# Patient Record
Sex: Male | Born: 1960 | Race: White | Hispanic: No | Marital: Married | Smoking: Never smoker
Health system: Southern US, Community
[De-identification: ages and names within clinical notes are randomized; demographics above are authoritative.]

## PROBLEM LIST (undated history)

## (undated) DIAGNOSIS — I4891 Unspecified atrial fibrillation: Secondary | ICD-10-CM

## (undated) DIAGNOSIS — K219 Gastro-esophageal reflux disease without esophagitis: Secondary | ICD-10-CM

## (undated) DIAGNOSIS — N200 Calculus of kidney: Secondary | ICD-10-CM

## (undated) DIAGNOSIS — G4730 Sleep apnea, unspecified: Secondary | ICD-10-CM

## (undated) DIAGNOSIS — Z87442 Personal history of urinary calculi: Secondary | ICD-10-CM

## (undated) DIAGNOSIS — E785 Hyperlipidemia, unspecified: Secondary | ICD-10-CM

## (undated) DIAGNOSIS — I10 Essential (primary) hypertension: Secondary | ICD-10-CM

## (undated) DIAGNOSIS — J302 Other seasonal allergic rhinitis: Secondary | ICD-10-CM

## (undated) DIAGNOSIS — L409 Psoriasis, unspecified: Secondary | ICD-10-CM

## (undated) DIAGNOSIS — J189 Pneumonia, unspecified organism: Secondary | ICD-10-CM

## (undated) DIAGNOSIS — J45909 Unspecified asthma, uncomplicated: Secondary | ICD-10-CM

## (undated) DIAGNOSIS — E669 Obesity, unspecified: Secondary | ICD-10-CM

## (undated) DIAGNOSIS — D849 Immunodeficiency, unspecified: Secondary | ICD-10-CM

## (undated) DIAGNOSIS — L4050 Arthropathic psoriasis, unspecified: Secondary | ICD-10-CM

## (undated) HISTORY — PX: TRICEPS TENDON REPAIR: SHX2577

## (undated) HISTORY — DX: Unspecified atrial fibrillation: I48.91

## (undated) HISTORY — PX: NOSE SURGERY: SHX723

## (undated) HISTORY — DX: Essential (primary) hypertension: I10

## (undated) HISTORY — DX: Personal history of urinary calculi: Z87.442

## (undated) HISTORY — PX: COLONOSCOPY: SHX174

## (undated) HISTORY — DX: Arthropathic psoriasis, unspecified: L40.50

---

## 2014-03-13 ENCOUNTER — Encounter (HOSPITAL_COMMUNITY): Payer: Self-pay | Admitting: Emergency Medicine

## 2014-03-13 ENCOUNTER — Inpatient Hospital Stay (HOSPITAL_COMMUNITY)
Admission: EM | Admit: 2014-03-13 | Discharge: 2014-03-25 | DRG: 163 | Disposition: A | Discharge status: Home / Self Care | Payer: BC Managed Care – PPO | Attending: Cardiothoracic Surgery | Admitting: Cardiothoracic Surgery

## 2014-03-13 ENCOUNTER — Emergency Department (HOSPITAL_COMMUNITY): Payer: BC Managed Care – PPO

## 2014-03-13 DIAGNOSIS — G4733 Obstructive sleep apnea (adult) (pediatric): Secondary | ICD-10-CM | POA: Diagnosis present

## 2014-03-13 DIAGNOSIS — Z9989 Dependence on other enabling machines and devices: Secondary | ICD-10-CM

## 2014-03-13 DIAGNOSIS — J45909 Unspecified asthma, uncomplicated: Secondary | ICD-10-CM | POA: Diagnosis present

## 2014-03-13 DIAGNOSIS — N131 Hydronephrosis with ureteral stricture, not elsewhere classified: Secondary | ICD-10-CM

## 2014-03-13 DIAGNOSIS — N1330 Unspecified hydronephrosis: Secondary | ICD-10-CM | POA: Diagnosis present

## 2014-03-13 DIAGNOSIS — K219 Gastro-esophageal reflux disease without esophagitis: Secondary | ICD-10-CM | POA: Diagnosis present

## 2014-03-13 DIAGNOSIS — Z7951 Long term (current) use of inhaled steroids: Secondary | ICD-10-CM

## 2014-03-13 DIAGNOSIS — Z79899 Other long term (current) drug therapy: Secondary | ICD-10-CM

## 2014-03-13 DIAGNOSIS — J189 Pneumonia, unspecified organism: Principal | ICD-10-CM | POA: Diagnosis present

## 2014-03-13 DIAGNOSIS — J869 Pyothorax without fistula: Secondary | ICD-10-CM | POA: Diagnosis present

## 2014-03-13 DIAGNOSIS — R0902 Hypoxemia: Secondary | ICD-10-CM

## 2014-03-13 DIAGNOSIS — J9601 Acute respiratory failure with hypoxia: Secondary | ICD-10-CM

## 2014-03-13 DIAGNOSIS — I10 Essential (primary) hypertension: Secondary | ICD-10-CM | POA: Diagnosis present

## 2014-03-13 DIAGNOSIS — R0781 Pleurodynia: Secondary | ICD-10-CM

## 2014-03-13 DIAGNOSIS — M549 Dorsalgia, unspecified: Secondary | ICD-10-CM | POA: Diagnosis present

## 2014-03-13 DIAGNOSIS — E669 Obesity, unspecified: Secondary | ICD-10-CM

## 2014-03-13 DIAGNOSIS — E785 Hyperlipidemia, unspecified: Secondary | ICD-10-CM | POA: Diagnosis present

## 2014-03-13 DIAGNOSIS — J939 Pneumothorax, unspecified: Secondary | ICD-10-CM

## 2014-03-13 DIAGNOSIS — Z23 Encounter for immunization: Secondary | ICD-10-CM

## 2014-03-13 DIAGNOSIS — J90 Pleural effusion, not elsewhere classified: Secondary | ICD-10-CM

## 2014-03-13 DIAGNOSIS — Z4682 Encounter for fitting and adjustment of non-vascular catheter: Secondary | ICD-10-CM

## 2014-03-13 DIAGNOSIS — G4730 Sleep apnea, unspecified: Secondary | ICD-10-CM

## 2014-03-13 DIAGNOSIS — Z9889 Other specified postprocedural states: Secondary | ICD-10-CM

## 2014-03-13 DIAGNOSIS — Z6841 Body Mass Index (BMI) 40.0 and over, adult: Secondary | ICD-10-CM

## 2014-03-13 HISTORY — DX: Sleep apnea, unspecified: G47.30

## 2014-03-13 HISTORY — DX: Unspecified asthma, uncomplicated: J45.909

## 2014-03-13 HISTORY — DX: Psoriasis, unspecified: L40.9

## 2014-03-13 HISTORY — DX: Immunodeficiency, unspecified: D84.9

## 2014-03-13 HISTORY — DX: Essential (primary) hypertension: I10

## 2014-03-13 HISTORY — DX: Calculus of kidney: N20.0

## 2014-03-13 HISTORY — DX: Other seasonal allergic rhinitis: J30.2

## 2014-03-13 HISTORY — DX: Obesity, unspecified: E66.9

## 2014-03-13 HISTORY — DX: Hyperlipidemia, unspecified: E78.5

## 2014-03-13 MED ORDER — HYDROMORPHONE HCL 1 MG/ML IJ SOLN
1.0000 mg | Freq: Once | INTRAMUSCULAR | Status: AC
  Administered 2014-03-13: 1 mg via INTRAVENOUS
  Filled 2014-03-13: qty 1

## 2014-03-13 MED ORDER — HYDROMORPHONE HCL 1 MG/ML IJ SOLN
1.0000 mg | Freq: Once | INTRAMUSCULAR | Status: AC
  Administered 2014-03-14: 1 mg via INTRAVENOUS
  Filled 2014-03-13: qty 1

## 2014-03-13 MED ORDER — SODIUM CHLORIDE 0.9 % IV SOLN
INTRAVENOUS | Status: DC
  Administered 2014-03-13 – 2014-03-16 (×4): via INTRAVENOUS

## 2014-03-13 MED ORDER — ONDANSETRON HCL 4 MG/2ML IJ SOLN
4.0000 mg | Freq: Once | INTRAMUSCULAR | Status: AC
  Administered 2014-03-13: 4 mg via INTRAVENOUS
  Filled 2014-03-13: qty 2

## 2014-03-13 NOTE — ED Notes (Signed)
Patient states right mid back pain that radiated into chest. Patient states he also has shortness of breath. Patient states "it feels like a monster kidney stone"

## 2014-03-14 ENCOUNTER — Emergency Department (HOSPITAL_COMMUNITY): Payer: BC Managed Care – PPO

## 2014-03-14 ENCOUNTER — Encounter (HOSPITAL_COMMUNITY): Payer: Self-pay

## 2014-03-14 DIAGNOSIS — J9601 Acute respiratory failure with hypoxia: Secondary | ICD-10-CM | POA: Diagnosis present

## 2014-03-14 DIAGNOSIS — Z7951 Long term (current) use of inhaled steroids: Secondary | ICD-10-CM | POA: Diagnosis not present

## 2014-03-14 DIAGNOSIS — I10 Essential (primary) hypertension: Secondary | ICD-10-CM | POA: Diagnosis present

## 2014-03-14 DIAGNOSIS — Z79899 Other long term (current) drug therapy: Secondary | ICD-10-CM | POA: Diagnosis not present

## 2014-03-14 DIAGNOSIS — J189 Pneumonia, unspecified organism: Secondary | ICD-10-CM | POA: Diagnosis present

## 2014-03-14 DIAGNOSIS — J45909 Unspecified asthma, uncomplicated: Secondary | ICD-10-CM | POA: Diagnosis present

## 2014-03-14 DIAGNOSIS — R0902 Hypoxemia: Secondary | ICD-10-CM

## 2014-03-14 DIAGNOSIS — E785 Hyperlipidemia, unspecified: Secondary | ICD-10-CM | POA: Diagnosis present

## 2014-03-14 DIAGNOSIS — M549 Dorsalgia, unspecified: Secondary | ICD-10-CM | POA: Diagnosis present

## 2014-03-14 DIAGNOSIS — N1330 Unspecified hydronephrosis: Secondary | ICD-10-CM | POA: Diagnosis present

## 2014-03-14 DIAGNOSIS — J869 Pyothorax without fistula: Secondary | ICD-10-CM | POA: Diagnosis present

## 2014-03-14 DIAGNOSIS — R0781 Pleurodynia: Secondary | ICD-10-CM | POA: Diagnosis present

## 2014-03-14 DIAGNOSIS — G4730 Sleep apnea, unspecified: Secondary | ICD-10-CM | POA: Diagnosis present

## 2014-03-14 DIAGNOSIS — N131 Hydronephrosis with ureteral stricture, not elsewhere classified: Secondary | ICD-10-CM

## 2014-03-14 DIAGNOSIS — G4733 Obstructive sleep apnea (adult) (pediatric): Secondary | ICD-10-CM | POA: Diagnosis present

## 2014-03-14 DIAGNOSIS — K219 Gastro-esophageal reflux disease without esophagitis: Secondary | ICD-10-CM | POA: Diagnosis present

## 2014-03-14 DIAGNOSIS — Z6841 Body Mass Index (BMI) 40.0 and over, adult: Secondary | ICD-10-CM | POA: Diagnosis not present

## 2014-03-14 DIAGNOSIS — Z23 Encounter for immunization: Secondary | ICD-10-CM | POA: Diagnosis not present

## 2014-03-14 LAB — URINALYSIS, ROUTINE W REFLEX MICROSCOPIC
Bilirubin Urine: NEGATIVE
Glucose, UA: NEGATIVE mg/dL
Hgb urine dipstick: NEGATIVE
Ketones, ur: NEGATIVE mg/dL
LEUKOCYTES UA: NEGATIVE
Nitrite: NEGATIVE
PROTEIN: NEGATIVE mg/dL
Specific Gravity, Urine: 1.02 (ref 1.005–1.030)
Urobilinogen, UA: 0.2 mg/dL (ref 0.0–1.0)
pH: 5.5 (ref 5.0–8.0)

## 2014-03-14 LAB — CBC WITH DIFFERENTIAL/PLATELET
BASOS ABS: 0.1 10*3/uL (ref 0.0–0.1)
Basophils Relative: 0 % (ref 0–1)
EOS ABS: 0.7 10*3/uL (ref 0.0–0.7)
EOS PCT: 4 % (ref 0–5)
HEMATOCRIT: 39.4 % (ref 39.0–52.0)
Hemoglobin: 13.6 g/dL (ref 13.0–17.0)
LYMPHS ABS: 1.5 10*3/uL (ref 0.7–4.0)
Lymphocytes Relative: 10 % — ABNORMAL LOW (ref 12–46)
MCH: 29.6 pg (ref 26.0–34.0)
MCHC: 34.5 g/dL (ref 30.0–36.0)
MCV: 85.7 fL (ref 78.0–100.0)
MONO ABS: 1.1 10*3/uL — AB (ref 0.1–1.0)
Monocytes Relative: 7 % (ref 3–12)
Neutro Abs: 12.1 10*3/uL — ABNORMAL HIGH (ref 1.7–7.7)
Neutrophils Relative %: 79 % — ABNORMAL HIGH (ref 43–77)
Platelets: 291 10*3/uL (ref 150–400)
RBC: 4.6 MIL/uL (ref 4.22–5.81)
RDW: 14.6 % (ref 11.5–15.5)
WBC: 15.4 10*3/uL — ABNORMAL HIGH (ref 4.0–10.5)

## 2014-03-14 LAB — CBC
HCT: 39.9 % (ref 39.0–52.0)
Hemoglobin: 13.7 g/dL (ref 13.0–17.0)
MCH: 29.5 pg (ref 26.0–34.0)
MCHC: 34.3 g/dL (ref 30.0–36.0)
MCV: 85.8 fL (ref 78.0–100.0)
Platelets: 259 10*3/uL (ref 150–400)
RBC: 4.65 MIL/uL (ref 4.22–5.81)
RDW: 14.9 % (ref 11.5–15.5)
WBC: 17.5 10*3/uL — AB (ref 4.0–10.5)

## 2014-03-14 LAB — BASIC METABOLIC PANEL
Anion gap: 12 (ref 5–15)
BUN: 19 mg/dL (ref 6–23)
CALCIUM: 8.7 mg/dL (ref 8.4–10.5)
CO2: 24 mEq/L (ref 19–32)
Chloride: 105 mEq/L (ref 96–112)
Creatinine, Ser: 1.12 mg/dL (ref 0.50–1.35)
GFR calc Af Amer: 86 mL/min — ABNORMAL LOW (ref >90)
GFR, EST NON AFRICAN AMERICAN: 74 mL/min — AB (ref >90)
GLUCOSE: 103 mg/dL — AB (ref 70–99)
Potassium: 3.7 mEq/L (ref 3.7–5.3)
SODIUM: 141 meq/L (ref 137–147)

## 2014-03-14 MED ORDER — DEXTROSE 5 % IV SOLN
1.0000 g | Freq: Once | INTRAVENOUS | Status: DC
  Filled 2014-03-14: qty 10

## 2014-03-14 MED ORDER — ATORVASTATIN CALCIUM 40 MG PO TABS
40.0000 mg | ORAL_TABLET | Freq: Every day | ORAL | Status: DC
  Administered 2014-03-14 – 2014-03-24 (×10): 40 mg via ORAL
  Filled 2014-03-14 (×11): qty 1

## 2014-03-14 MED ORDER — SODIUM CHLORIDE 0.9 % IJ SOLN: 3.0000 mL | INTRAMUSCULAR | Status: DC | PRN

## 2014-03-14 MED ORDER — SODIUM CHLORIDE 0.9 % IV BOLUS (SEPSIS)
500.0000 mL | Freq: Once | INTRAVENOUS | Status: AC
  Administered 2014-03-14: 500 mL via INTRAVENOUS

## 2014-03-14 MED ORDER — MOMETASONE FURO-FORMOTEROL FUM 100-5 MCG/ACT IN AERO
2.0000 | INHALATION_SPRAY | Freq: Two times a day (BID) | RESPIRATORY_TRACT | Status: DC
  Administered 2014-03-14 – 2014-03-25 (×21): 2 via RESPIRATORY_TRACT
  Filled 2014-03-14 (×4): qty 8.8

## 2014-03-14 MED ORDER — DEXTROSE 5 % IV SOLN
1.0000 g | Freq: Every day | INTRAVENOUS | Status: DC
  Administered 2014-03-14 – 2014-03-16 (×4): 1 g via INTRAVENOUS
  Filled 2014-03-14 (×4): qty 10

## 2014-03-14 MED ORDER — SODIUM CHLORIDE 0.9 % IV SOLN: 250.0000 mL | INTRAVENOUS | Status: DC | PRN

## 2014-03-14 MED ORDER — KETOROLAC TROMETHAMINE 30 MG/ML IJ SOLN
30.0000 mg | Freq: Once | INTRAMUSCULAR | Status: AC
  Administered 2014-03-14: 30 mg via INTRAVENOUS
  Filled 2014-03-14: qty 1

## 2014-03-14 MED ORDER — ACETAMINOPHEN 325 MG PO TABS: 650.0000 mg | ORAL_TABLET | Freq: Four times a day (QID) | ORAL | Status: DC | PRN

## 2014-03-14 MED ORDER — SENNA 8.6 MG PO TABS
1.0000 | ORAL_TABLET | Freq: Two times a day (BID) | ORAL | Status: DC
  Administered 2014-03-14 – 2014-03-19 (×12): 8.6 mg via ORAL
  Filled 2014-03-14 (×14): qty 1

## 2014-03-14 MED ORDER — PANTOPRAZOLE SODIUM 40 MG PO TBEC
40.0000 mg | DELAYED_RELEASE_TABLET | Freq: Every day | ORAL | Status: DC
  Administered 2014-03-14 – 2014-03-25 (×11): 40 mg via ORAL
  Filled 2014-03-14 (×12): qty 1

## 2014-03-14 MED ORDER — ACETAMINOPHEN 650 MG RE SUPP
650.0000 mg | Freq: Four times a day (QID) | RECTAL | Status: DC | PRN
Start: 2014-03-14 — End: 2014-03-20

## 2014-03-14 MED ORDER — ONDANSETRON HCL 4 MG/2ML IJ SOLN: 4.0000 mg | Freq: Four times a day (QID) | INTRAMUSCULAR | Status: DC | PRN

## 2014-03-14 MED ORDER — HYDROCODONE-ACETAMINOPHEN 5-325 MG PO TABS
1.0000 | ORAL_TABLET | ORAL | Status: DC | PRN
  Administered 2014-03-14: 1 via ORAL
  Administered 2014-03-14: 2 via ORAL
  Administered 2014-03-14: 1 via ORAL
  Administered 2014-03-14 – 2014-03-16 (×10): 2 via ORAL
  Administered 2014-03-16: 1 via ORAL
  Administered 2014-03-16: 2 via ORAL
  Administered 2014-03-17: 1 via ORAL
  Administered 2014-03-17 (×2): 2 via ORAL
  Administered 2014-03-17: 1 via ORAL
  Administered 2014-03-18: 2 via ORAL
  Administered 2014-03-18 (×2): 1 via ORAL
  Administered 2014-03-18 – 2014-03-20 (×7): 2 via ORAL
  Filled 2014-03-14 (×6): qty 2
  Filled 2014-03-14 (×3): qty 1
  Filled 2014-03-14 (×12): qty 2
  Filled 2014-03-14: qty 1
  Filled 2014-03-14 (×2): qty 2
  Filled 2014-03-14: qty 1
  Filled 2014-03-14 (×5): qty 2
  Filled 2014-03-14: qty 1

## 2014-03-14 MED ORDER — SODIUM CHLORIDE 0.9 % IV SOLN: INTRAVENOUS | Status: DC

## 2014-03-14 MED ORDER — DEXTROSE 5 % IV SOLN
500.0000 mg | INTRAVENOUS | Status: DC
  Administered 2014-03-14 – 2014-03-16 (×4): 500 mg via INTRAVENOUS
  Filled 2014-03-14 (×4): qty 500

## 2014-03-14 MED ORDER — BISACODYL 10 MG RE SUPP
10.0000 mg | Freq: Every day | RECTAL | Status: DC | PRN
  Administered 2014-03-14: 10 mg via RECTAL
  Filled 2014-03-14: qty 1

## 2014-03-14 MED ORDER — MONTELUKAST SODIUM 10 MG PO TABS
10.0000 mg | ORAL_TABLET | Freq: Every day | ORAL | Status: DC
  Administered 2014-03-14 – 2014-03-24 (×11): 10 mg via ORAL
  Filled 2014-03-14 (×15): qty 1

## 2014-03-14 MED ORDER — LISINOPRIL 10 MG PO TABS
10.0000 mg | ORAL_TABLET | Freq: Every day | ORAL | Status: DC
  Administered 2014-03-14 – 2014-03-19 (×6): 10 mg via ORAL
  Filled 2014-03-14 (×7): qty 1

## 2014-03-14 MED ORDER — SODIUM CHLORIDE 0.9 % IJ SOLN
3.0000 mL | Freq: Two times a day (BID) | INTRAMUSCULAR | Status: DC
  Administered 2014-03-14 – 2014-03-19 (×7): 3 mL via INTRAVENOUS

## 2014-03-14 MED ORDER — TRAMADOL HCL 50 MG PO TABS
50.0000 mg | ORAL_TABLET | Freq: Four times a day (QID) | ORAL | Status: DC | PRN
  Administered 2014-03-14: 50 mg via ORAL
  Filled 2014-03-14 (×2): qty 1

## 2014-03-14 MED ORDER — ONDANSETRON HCL 4 MG PO TABS: 4.0000 mg | ORAL_TABLET | Freq: Four times a day (QID) | ORAL | Status: DC | PRN

## 2014-03-14 MED ORDER — HEPARIN SODIUM (PORCINE) 5000 UNIT/ML IJ SOLN
5000.0000 [IU] | Freq: Three times a day (TID) | INTRAMUSCULAR | Status: DC
  Administered 2014-03-14 – 2014-03-19 (×16): 5000 [IU] via SUBCUTANEOUS
  Filled 2014-03-14 (×22): qty 1

## 2014-03-14 NOTE — Progress Notes (Signed)
Pt was placed on CPAP for the night;pt has his home nasal mask attached to our machine.

## 2014-03-14 NOTE — Progress Notes (Signed)
Chart reviewed and patient examined.   Subjective: continues with pain on inspiration. "it really hurts to move and breath deeply"   Objective: Afebrile, hemodynamically stable. Mildly hypoxic on room air at rest.   PE:  Gen: sitting on side of bed.  CV: RRR No MGR No LE edema Resp: normal effort but shallow, BS diminished both bases. No wheeze Abdomen: obese soft +BS non-tender Neuro: alert and oriented. Speech clear   Assessment/Plan  CAP: continue rocephin and azithromax day #1. Obtain strep pnuemo urine antigen and legionella urine antigen. Provide supportive therapy i.e fluids, pain medicine, oxygen supplementation. Monitor closely. Blood cultures if fever.   Hypoxia: mild on room air at rest. Related to #1 in patient with hx asthma. See above. Continue home inhaler. Will ambulate and monitor saturation level  Back pain: lumbar mostly on right with some left flank pain. likely related to #1 in setting of left hydronephrosis. Pain med. Will likely need OP urology consult.   Left hydronephrosis without urolithiasis: per CT. Urine output good. Manage pain. May benefit OP urology follow up.   HTN : controlled. Continue home meds  Sleep apnea: CPAP    Toya SmothersKaren Timohty Renbarger, NP

## 2014-03-14 NOTE — H&P (Signed)
[facility] History and Physical  [name] MajorBruce Olivero ZOX:[id number] DOB: 04/23/1961 DOA: 03/13/2014  Referring physician: Dr. Lynelle DoctorKnapp PCP: No primary provider on file.  Specialists: none  Chief Complaint: back pain  Assessment/Plan Active Problems:   CAP (community acquired pneumonia) Hypoxemia Asthma OSA L hydronephrosis GERD HLD   CAP: R lower lobe pneumonia. Ambulatory hypoxemia to 85 in ED. On RA at rest. toradol adn dilaudid given for pain in ED.  - Admit for Obs.  - CTX and Azithro - deep breathing and ambulation encouraged as tolerated - tylenol PRN - tramadol PRN  Athma: no sign of exacerbation - continue home Dulera and singulair  HTN: normotensive on presentation - continue home Lisinopril 10mg    HLD - continue atorvastatin  OSA: wears CPAP at home - CPAP.   Hydronephrosis: L side. No acute process/stone. 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 Renal function is nml.  - f/u outpt.   GERD:  - protonix  DVT Prophylaxis: Hep Metompkin TID  Code Status: FULL Family Communication: no family available Disposition Plan: Obs. DC pending respiratory status  HPI: [name] MajorBruce [name] is a 53 y.o. male [facility] with  R flank pain to mid back pain. Getting worse. No w/ R lower chest pain. Worse w/ deep inspiration. Advil 600mg  w/ some benefit. Denies fevers, rash, dysuria, gross hematuria, palpitations, CP. Mild SOB. CPAP at night but no daytime O2. URI w/ cough for past 2 wks.   Review of Systems: Per HPI w/ all other systems negative.   Past Medical History  Diagnosis Date  . Hypertension   . Kidney stone   . Sleep apnea   . Asthma   . Seasonal allergies   . Hyperlipemia   . Psoriasis   . Immune deficiency disorder    Past Surgical History  Procedure Laterality Date  . Nose surgery     Social History:  History   Social History Narrative  . No narrative on file    No Known Allergies  History reviewed. No pertinent family history.  Prior to  Admission medications   Not on File   Physical Exam: Filed Vitals:   03/13/14 2149 03/14/14 0004 03/14/14 0026 03/14/14 0156  BP: 148/66 117/61  106/48  Pulse: 85 77  79  Temp: 98.6 F (37 C)   98.4 F (36.9 C)  TempSrc: Oral   Oral  Resp: 22 16  20   Height: 5\' 9"  (1.753 m)   5\' 9"  (1.753 m)  Weight: 129.275 kg (285 lb)   130.6 kg (287 lb 14.7 oz)  SpO2: 95% 93% 85% 94%     General:   NAD  Eyes: EOMI   ENT: mmm,  Neck: FROM  Cardiovascular:  RRR, no m/r/g  Respiratory: R lung base w/ diminished breath sounds and few crackles, no wheezing, nml effort  Abdomen: NABS, nonttp  Skin: warm, well perfused, intact  Musculoskeletal: No LE edema, moves all extremities spontaneously   Psychiatric: nml affect  Neurologic: CN2-12 Grossly intact, cerebellar fxn nml  Labs on Admission:  Basic Metabolic Panel:  Recent Labs Lab 03/13/14 2230  NA 141  K 3.7  CL 105  CO2 24  GLUCOSE 103*  BUN 19  CREATININE 1.12  CALCIUM 8.7   Liver Function Tests: No results found for this basename: AST, ALT, ALKPHOS, BILITOT, PROT, ALBUMIN,  in the last 168 hours No results found for this basename: LIPASE, AMYLASE,  in the last 168 hours No results found for this basename: AMMONIA,  in  the last 168 hours CBC:  Recent Labs Lab 03/13/14 2230  WBC 15.4*  NEUTROABS 12.1*  HGB 13.6  HCT 39.4  MCV 85.7  PLT 291   Cardiac Enzymes: No results found for this basename: CKTOTAL, CKMB, CKMBINDEX, TROPONINI,  in the last 168 hours  BNP (last 3 results) No results found for this basename: PROBNP,  in the last 8760 hours CBG: No results found for this basename: GLUCAP,  in the last 168 hours  Radiological Exams on Admission: Dg Chest 2 View  03/14/2014   CLINICAL DATA:  Productive cough with green sputum for 2 weeks. Chills. Sharp pain at the right lower chest with deep inspiration. Shortness of breath.  EXAM: CHEST  2 VIEW  COMPARISON:  CT abdomen and pelvis 03/13/2014  FINDINGS:  Linear infiltration in the lung bases corresponding with consolidation seen in the right lung base and atelectasis in the left lung base on previous CT. Shallow inspiration. Heart size and pulmonary vascularity are normal. Probable emphysematous changes in the upper lungs. Blunting of the right costophrenic angle suggesting small effusions.  IMPRESSION: Linear consolidation in the right lung base with small effusion consistent with pneumonia. Shallow inspiration. Atelectasis in the left base.   Electronically Signed   By: Burman Nieves M.D.   On: 03/14/2014 00:40   Ct Renal Stone Study  03/13/2014   CLINICAL DATA:  RIGHT flank pain beginning today, worsening throughout the days, intermittently unbearable. History of renal stones.  EXAM: CT ABDOMEN AND PELVIS WITHOUT CONTRAST  TECHNIQUE: Multidetector CT imaging of the abdomen and pelvis was performed following the standard protocol without IV contrast.  COMPARISON:  None.  FINDINGS: LUNG BASES: Included view of the lung bases demonstrates RIGHT lung base dense consolidation. LEFT lung base atelectasis. Heart size is normal, included pericardium is unremarkable. The visualized heart and pericardium are unremarkable.  KIDNEYS/BLADDER: Kidneys are orthotopic, demonstrating normal size and morphology. Moderate LEFT hydronephrosis. No urolithiasis. Limited assessment for renal masses on this nonenhanced examination. The unopacified ureters are normal in course and caliber. Urinary bladder is partially distended and unremarkable.  SOLID ORGANS: The liver, spleen, gallbladder, pancreas and adrenal glands are unremarkable for this non-contrast examination.  GASTROINTESTINAL TRACT: The stomach, small and large bowel are normal in course and caliber without inflammatory changes, the sensitivity may be decreased by lack of enteric contrast. Normal appendix.  PERITONEUM/RETROPERITONEUM: Mild Misty mesentery and small, likely reactive lymph nodes. No intraperitoneal free  fluid nor free air. Aortoiliac vessels are normal in course and caliber. No lymphadenopathy by CT size criteria. Internal reproductive organs are unremarkable.  SOFT TISSUES/ OSSEOUS STRUCTURES: Nonsuspicious. Moderate LEFT and small RIGHT fat containing inguinal hernia. Mild broad levoscoliosis.  IMPRESSION: Partially imaged RIGHT lower lobe pneumonia.  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 Recommend followup.  Mild misty mesentery is likely reactive.   Electronically Signed   By: Awilda Metro   On: 03/13/2014 23:57       Time spent: 50 min in direct pt care and coordination.    Danyale Ridinger, Elmon Else, MD [facility] [email or web address] Password [email or web address] 03/14/2014, 2:11 AM

## 2014-03-14 NOTE — ED Provider Notes (Signed)
CSN: [id number]     Arrival date & time 03/13/14  2143 History   First MD Initiated Contact with Patient 03/13/14 2204     Chief Complaint  Patient presents with  . Back Pain     (Consider location/radiation/quality/duration/timing/severity/associated sxs/prior Treatment) HPI Patient states this morning he woke up about 7 AM with pain in his right flank that radiated into his right upper abdomen that lasted about 1-1/2 hours intensely,  he states the pain didn't let up but was persistent all day. He states it started getting worse again this afternoon. He states movement makes it hurt more as does deep breathing. He has had nausea without vomiting or hematuria. He states he's had cough for the past 2 weeks of yellow-green sputum. He denies any fever. He states at times he has some shortness of breath. He states he's had this same pain before when he had a kidney stone. He does report a lot of tea ingestion and he works in a very [employer] where he sweats a lot. Patient states he has a history of allergy-induced asthma however he has not been wheezing.   PCP Dr Ignacia Palma in [place] No urology  Past Medical History  Diagnosis Date  . Hypertension   . Kidney stone   . Sleep apnea   . Asthma   . Seasonal allergies   . Hyperlipemia   . Psoriasis   . Immune deficiency disorder    Past Surgical History  Procedure Laterality Date  . Nose surgery     History reviewed. No pertinent family history. History  Substance Use Topics  . Smoking status: Never Smoker   . Smokeless tobacco: Not on file  . Alcohol Use: No  employed Lives at home Lives with spouse  Review of Systems  All other systems reviewed and are negative.     Allergies  Review of patient's allergies indicates no known allergies.  Home Medications   Prior to Admission medications   Not on File   BP 117/61  Pulse 77  Temp(Src) 98.6 F (37 C) (Oral)  Resp 16  Ht 5\' 9"  (1.753 m)  Wt 285 lb (129.275 kg)   BMI 42.07 kg/m2  SpO2 93%  Vital signs normal   Physical Exam  Nursing note and vitals reviewed. Constitutional: He is oriented to person, place, and time. He appears well-developed and well-nourished.  Non-toxic appearance. He does not appear ill. He appears distressed.  obese  HENT:  Head: Normocephalic and atraumatic.  Right Ear: External ear normal.  Left Ear: External ear normal.  Nose: Nose normal. No mucosal edema or rhinorrhea.  Mouth/Throat: Oropharynx is clear and moist and mucous membranes are normal. No dental abscesses or uvula swelling.  Eyes: Conjunctivae and EOM are normal. Pupils are equal, round, and reactive to light.  Neck: Normal range of motion and full passive range of motion without pain. Neck supple.  Cardiovascular: Normal rate, regular rhythm and normal heart sounds.  Exam reveals no gallop and no friction rub.   No murmur heard. Pulmonary/Chest: Effort normal and breath sounds normal. No respiratory distress. He has no wheezes. He has no rhonchi. He has no rales. He exhibits no tenderness and no crepitus.  Abdominal: Soft. Normal appearance and bowel sounds are normal. He exhibits no distension. There is tenderness. There is no rebound and no guarding.    Musculoskeletal: Normal range of motion. He exhibits no edema and no tenderness.       Back:  Moves all extremities  well.   Neurological: He is alert and oriented to person, place, and time. He has normal strength. No cranial nerve deficit.  Skin: Skin is warm, dry and intact. No rash noted. No erythema. There is pallor.  Psychiatric: His speech is normal and behavior is normal. His mood appears anxious.    ED Course  Procedures (including critical care time)  Medications  0.9 %  sodium chloride infusion ( Intravenous New Bag/Given 03/13/14 2245)  ketorolac (TORADOL) 30 MG/ML injection 30 mg (not administered)  cefTRIAXone (ROCEPHIN) 1 g in dextrose 5 % 50 mL IVPB (not administered)  azithromycin  (ZITHROMAX) 500 mg in dextrose 5 % 250 mL IVPB (not administered)  sodium chloride 0.9 % bolus 500 mL (not administered)  HYDROmorphone (DILAUDID) injection 1 mg (1 mg Intravenous Given 03/13/14 2245)  ondansetron (ZOFRAN) injection 4 mg (4 mg Intravenous Given 03/13/14 2245)  HYDROmorphone (DILAUDID) injection 1 mg (1 mg Intravenous Given 03/14/14 0000)    Patient was initially treated for a kidney stone. However after reviewing his CT scan he was started on community-acquired pneumonia antibiotics. He was also given anti-inflammatories for his pleuritic chest pain.  Nursing staff states they ambulated patient and his pulse ox dropped to 85% on room air.  01:00 Dr Konrad Dolores admit to med-surg, observe, team 2  Labs Review  Results for orders placed during the hospital encounter of 03/13/14  CBC WITH DIFFERENTIAL      Result Value Ref Range   WBC 15.4 (*) 4.0 - 10.5 K/uL   RBC 4.60  4.22 - 5.81 MIL/uL   Hemoglobin 13.6  13.0 - 17.0 g/dL   HCT 04.5  40.9 - 81.1 %   MCV 85.7  78.0 - 100.0 fL   MCH 29.6  26.0 - 34.0 pg   MCHC 34.5  30.0 - 36.0 g/dL   RDW 91.4  78.2 - 95.6 %   Platelets 291  150 - 400 K/uL   Neutrophils Relative % 79 (*) 43 - 77 %   Neutro Abs 12.1 (*) 1.7 - 7.7 K/uL   Lymphocytes Relative 10 (*) 12 - 46 %   Lymphs Abs 1.5  0.7 - 4.0 K/uL   Monocytes Relative 7  3 - 12 %   Monocytes Absolute 1.1 (*) 0.1 - 1.0 K/uL   Eosinophils Relative 4  0 - 5 %   Eosinophils Absolute 0.7  0.0 - 0.7 K/uL   Basophils Relative 0  0 - 1 %   Basophils Absolute 0.1  0.0 - 0.1 K/uL  BASIC METABOLIC PANEL      Result Value Ref Range   Sodium 141  137 - 147 mEq/L   Potassium 3.7  3.7 - 5.3 mEq/L   Chloride 105  96 - 112 mEq/L   CO2 24  19 - 32 mEq/L   Glucose, Bld 103 (*) 70 - 99 mg/dL   BUN 19  6 - 23 mg/dL   Creatinine, Ser 2.13  0.50 - 1.35 mg/dL   Calcium 8.7  8.4 - 08.6 mg/dL   GFR calc non Af Amer 74 (*) >90 mL/min   GFR calc Af Amer 86 (*) >90 mL/min   Anion gap 12  5 - 15    Laboratory interpretation all normal except leukocytosis     Imaging Review Dg Chest 2 View  03/14/2014   CLINICAL DATA:  Productive cough with green sputum for 2 weeks. Chills. Sharp pain at the right lower chest with deep inspiration. Shortness of breath.  EXAM:  CHEST  2 VIEW  COMPARISON:  CT abdomen and pelvis 03/13/2014  FINDINGS: Linear infiltration in the lung bases corresponding with consolidation seen in the right lung base and atelectasis in the left lung base on previous CT. Shallow inspiration. Heart size and pulmonary vascularity are normal. Probable emphysematous changes in the upper lungs. Blunting of the right costophrenic angle suggesting small effusions.  IMPRESSION: Linear consolidation in the right lung base with small effusion consistent with pneumonia. Shallow inspiration. Atelectasis in the left base.   Electronically Signed   By: Burman NievesWilliam  Stevens M.D.   On: 03/14/2014 00:40   Ct Renal Stone Study  03/13/2014   CLINICAL DATA:  RIGHT flank pain beginning today, worsening throughout the days, intermittently unbearable. History of renal stones.  EXAM: CT ABDOMEN AND PELVIS WITHOUT CONTRAST  TECHNIQUE: Multidetector CT imaging of the abdomen and pelvis was performed following the standard protocol without IV contrast.  COMPARISON:  None.  FINDINGS: LUNG BASES: Included view of the lung bases demonstrates RIGHT lung base dense consolidation. LEFT lung base atelectasis. Heart size is normal, included pericardium is unremarkable. The visualized heart and pericardium are unremarkable.  KIDNEYS/BLADDER: Kidneys are orthotopic, demonstrating normal size and morphology. Moderate LEFT hydronephrosis. No urolithiasis. Limited assessment for renal masses on this nonenhanced examination. The unopacified ureters are normal in course and caliber. Urinary bladder is partially distended and unremarkable.  SOLID ORGANS: The liver, spleen, gallbladder, pancreas and adrenal glands are unremarkable for  this non-contrast examination.  GASTROINTESTINAL TRACT: The stomach, small and large bowel are normal in course and caliber without inflammatory changes, the sensitivity may be decreased by lack of enteric contrast. Normal appendix.  PERITONEUM/RETROPERITONEUM: Mild Misty mesentery and small, likely reactive lymph nodes. No intraperitoneal free fluid nor free air. Aortoiliac vessels are normal in course and caliber. No lymphadenopathy by CT size criteria. Internal reproductive organs are unremarkable.  SOFT TISSUES/ OSSEOUS STRUCTURES: Nonsuspicious. Moderate LEFT and small RIGHT fat containing inguinal hernia. Mild broad levoscoliosis.  IMPRESSION: Partially imaged RIGHT lower lobe pneumonia.  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 Recommend followup.  Mild misty mesentery is likely reactive.   Electronically Signed   By: Awilda Metroourtnay  Bloomer   On: 03/13/2014 23:57     EKG Interpretation None       Date: 03/14/2014  Rate: 79  Rhythm: normal sinus rhythm  QRS Axis: normal  Intervals: normal  ST/T Wave abnormalities: normal  Conduction Disutrbances:none  Narrative Interpretation:   Old EKG Reviewed: none available    MDM   Final diagnoses:  CAP (community acquired pneumonia)  Hypoxia  Pleuritic chest pain     Plan admission  Devoria AlbeIva Duel Conrad, MD, Armando GangFACEP     Ward GivensIva L Rabab Currington, MD 03/14/14 [phone]

## 2014-03-14 NOTE — Care Management Note (Signed)
    Page 1 of 1   03/25/2014     3:14:12 PM CARE MANAGEMENT NOTE 03/25/2014  Patient:  [name],[name]   Account Number:  [id number]  Date Initiated:  03/14/2014  Documentation initiated by:  Anibal HendersonBOLDEN,[name]  Subjective/Objective Assessment:   Admitted with PNA. Pt is from home with spouse and family and will return home at D/C.     Action/Plan:   No needs at present. Will follow   Anticipated DC Date:  03/25/2014   Anticipated DC Plan:  HOME W HOME HEALTH SERVICES      DC Planning Services  CM consult      Choice offered to / List presented to:             Status of service:  Completed, signed off Medicare Important Message given?   (If response is "NO", the following Medicare IM given date fields will be blank) Date Medicare IM given:   Medicare IM given by:   Date Additional Medicare IM given:   Additional Medicare IM given by:    Discharge Disposition:  HOME/SELF CARE  Per UR Regulation:  Reviewed for med. necessity/level of care/duration of stay  If discussed at Long Length of Stay Meetings, dates discussed:   03/20/2014  03/25/2014    Comments:  03/25/14 Sidney AceJulie Leylany Nored, RN, BSN [phone] Pt for dc home today; no dc needs.  03/20/14 Sidney AceJulie Jazmyne Beauchesne, RN, BSN [phone] Pt to OR for VATS today.  03/18/14 1300 [name] Bolden RN/CM Pt was to D/C home today but O2 Sats still low, so further testing being done- may need  thoracentesis 03/14/14 1430 Anibal HendersonGeneva Bolden RN/CM

## 2014-03-14 NOTE — Progress Notes (Signed)
Patient ambulated in hallway. Prior o2 sats 93% and post o2 sats 93%. Patient tolerated well.

## 2014-03-14 NOTE — Progress Notes (Signed)
Pt ambulated in halls O2 sat 93% on room air after ambulation

## 2014-03-14 NOTE — Progress Notes (Signed)
Pt seen and examined. Agree with assessment and plan per Ms. Black, NP. Briefly, pt presents with chief complaint of back pain, ultimately found to have CAP and started on empiric abx. Cultures pending. Pt with continued back pains with L hydronephrosis. Consideration for possible Urology consult as OP. Pt does complain of decreased BM with abd fullness while on pain meds. Will give trial of cathartics.

## 2014-03-15 ENCOUNTER — Inpatient Hospital Stay (HOSPITAL_COMMUNITY): Payer: BC Managed Care – PPO

## 2014-03-15 DIAGNOSIS — G4730 Sleep apnea, unspecified: Secondary | ICD-10-CM

## 2014-03-15 LAB — COMPREHENSIVE METABOLIC PANEL
ALT: 15 U/L (ref 0–53)
ANION GAP: 12 (ref 5–15)
AST: 10 U/L (ref 0–37)
Albumin: 2.7 g/dL — ABNORMAL LOW (ref 3.5–5.2)
Alkaline Phosphatase: 78 U/L (ref 39–117)
BUN: 12 mg/dL (ref 6–23)
CO2: 23 mEq/L (ref 19–32)
CREATININE: 0.9 mg/dL (ref 0.50–1.35)
Calcium: 8.3 mg/dL — ABNORMAL LOW (ref 8.4–10.5)
Chloride: 103 mEq/L (ref 96–112)
GFR calc non Af Amer: >90 mL/min (ref >90)
Glucose, Bld: 123 mg/dL — ABNORMAL HIGH (ref 70–99)
Potassium: 4.1 mEq/L (ref 3.7–5.3)
Sodium: 138 mEq/L (ref 137–147)
TOTAL PROTEIN: 6.4 g/dL (ref 6.0–8.3)
Total Bilirubin: 0.7 mg/dL (ref 0.3–1.2)

## 2014-03-15 LAB — STREP PNEUMONIAE URINARY ANTIGEN: STREP PNEUMO URINARY ANTIGEN: NEGATIVE

## 2014-03-15 LAB — TSH: TSH: 3.54 u[IU]/mL (ref 0.350–4.500)

## 2014-03-15 LAB — CBC
HCT: 39.3 % (ref 39.0–52.0)
HEMOGLOBIN: 13.4 g/dL (ref 13.0–17.0)
MCH: 29.3 pg (ref 26.0–34.0)
MCHC: 34.1 g/dL (ref 30.0–36.0)
MCV: 85.8 fL (ref 78.0–100.0)
Platelets: 287 10*3/uL (ref 150–400)
RBC: 4.58 MIL/uL (ref 4.22–5.81)
RDW: 15.3 % (ref 11.5–15.5)
WBC: 20.2 10*3/uL — ABNORMAL HIGH (ref 4.0–10.5)

## 2014-03-15 LAB — HIV ANTIBODY (ROUTINE TESTING W REFLEX): HIV 1&2 Ab, 4th Generation: NONREACTIVE

## 2014-03-15 LAB — PRO B NATRIURETIC PEPTIDE: PRO B NATRI PEPTIDE: 60.3 pg/mL (ref 0–125)

## 2014-03-15 LAB — TROPONIN I: Troponin I: <0.3 ng/mL (ref <0.30)

## 2014-03-15 MED ORDER — VANCOMYCIN HCL 10 G IV SOLR
2000.0000 mg | Freq: Once | INTRAVENOUS | Status: DC
  Filled 2014-03-15: qty 2000

## 2014-03-15 MED ORDER — VANCOMYCIN HCL 10 G IV SOLR
1250.0000 mg | Freq: Two times a day (BID) | INTRAVENOUS | Status: DC
  Administered 2014-03-16 – 2014-03-17 (×3): 1250 mg via INTRAVENOUS
  Filled 2014-03-15 (×3): qty 1250

## 2014-03-15 NOTE — Progress Notes (Signed)
ANTIBIOTIC CONSULT NOTE  Pharmacy Consult for Vancomycin Indication: pneumonia  No Known Allergies  Patient Measurements: Height: 5\' 9"  (175.3 cm) Weight: 287 lb 14.7 oz (130.6 kg) IBW/kg (Calculated) : 70.7  Vital Signs: Temp: 98.4 F (36.9 C) (10/03 0544) Temp Source: Oral (10/03 0544) BP: 126/60 mmHg (10/03 1121) Pulse Rate: 64 (10/03 1121) Intake/Output from previous day: 10/02 0701 - 10/03 0700 In: 720 [P.O.:720] Out: 300 [Urine:300] Intake/Output from this shift:   Labs:  Recent Labs  03/13/14 2230 03/14/14 1144 03/15/14 0642  WBC 15.4* 17.5* 20.2*  HGB 13.6 13.7 13.4  PLT 291 259 287  CREATININE 1.12  --  0.90   Estimated Creatinine Clearance: 128.6 ml/min (by C-G formula based on Cr of 0.9). No results found for this basename: VANCOTROUGH, VANCOPEAK, VANCORANDOM, GENTTROUGH, GENTPEAK, GENTRANDOM, TOBRATROUGH, TOBRAPEAK, TOBRARND, AMIKACINPEAK, AMIKACINTROU, AMIKACIN,  in the last 72 hours   Microbiology: No results found for this or any previous visit (from the past 720 hour(s)).  Anti-infectives   Start     Dose/Rate Route Frequency Ordered Stop   03/14/14 0300  cefTRIAXone (ROCEPHIN) 1 g in dextrose 5 % 50 mL IVPB     1 g 100 mL/hr over 30 Minutes Intravenous Daily at bedtime 03/14/14 0247     03/14/14 0015  cefTRIAXone (ROCEPHIN) 1 g in dextrose 5 % 50 mL IVPB  Status:  Discontinued     1 g 100 mL/hr over 30 Minutes Intravenous  Once 03/14/14 0005 03/14/14 0318   03/14/14 0015  azithromycin (ZITHROMAX) 500 mg in dextrose 5 % 250 mL IVPB     500 mg 250 mL/hr over 60 Minutes Intravenous Every 24 hours 03/14/14 0005       Assessment: Okay for Protocol for protocol, elevated WBC, Vancomycin to be added to Zithromax / Ceftriaxone regimen initially prescribed for CAP.  Hydronephrosis noted.  Obesity/Normalized CrCl dosing protocol will be initiate with an estimated normalized CrCl = 97 ml/min.   Goal of Therapy:  Vancomycin trough level 15-20  mcg/ml  Plan:  Vancomycin 2000mg  IV x 1, then 1500mg  IV every 12 hours. Measure antibiotic drug levels at steady state Follow up culture results  Mady GemmaHayes, Ariannie Penaloza R 03/15/2014,2:05 PM

## 2014-03-15 NOTE — Progress Notes (Signed)
Patient is using his own CPAP unit form home with his own mask. The unit has 3lpm bled into it while at rest.

## 2014-03-15 NOTE — Progress Notes (Signed)
[facility] PROGRESS NOTE  [name] [name] ZOX:[id number] DOB: January 11, 1961 DOA: 03/13/2014 PCP: No primary provider on file.  Assessment/Plan: 1. CAP 1. Pt is continued on azithromycin and rocephin 2. Remains afebrile overnight, however leukocytosis worsened to 20k<-15k 3. Will obtain blood cx. UA unremarkable 4. Repeat cxr for interval change with findings suggestive of ?CHF. Will order 2d echo, bnp, TSH, serial cardiac enzymes 5. Will add vanc to broaden abx spectrum 2. Hypoxia 1. Improved   3. Back pain 1. Cont pain meds as tolerated 4. L sided hydronephrosis 1. Pt voiding well 2. Normal renal function 3. Follow up as OP 5. HTN 1. BP stable 2. Cont current regimen 6. OSA 1. Cont cpap as tolerated 7. DVT prophylaxis 1. Heparin subQ  Code Status: Full Family Communication: Pt in room Disposition Plan: Pending   Consultants:    Procedures:    Antibiotics:  Azithromycin 10/2>>>  Rocepin 10/2>>>  Vancomycin 10/3>>>  HPI/Subjective: Reports feeling better. No acute events noted overnight  Objective: Filed Vitals:   03/14/14 1919 03/14/14 2009 03/15/14 0544 03/15/14 0738  BP:  143/67 130/59   Pulse:  80 73   Temp:  99.4 F (37.4 C) 98.4 F (36.9 C)   TempSrc:  Oral Oral   Resp:  20 14   Height:      Weight:      SpO2: 92% 93% 92% 87%    Intake/Output Summary (Last 24 hours) at 03/15/14 0913 Last data filed at 03/14/14 1727  Gross per 24 hour  Intake    720 ml  Output    300 ml  Net    420 ml   Filed Weights   03/13/14 2149 03/14/14 0156  Weight: 129.275 kg (285 lb) 130.6 kg (287 lb 14.7 oz)    Exam:   General:  Awake, in nad  Cardiovascular: regular,s1, s2  Respiratory: normal resp effort, no wheezing  Abdomen: soft, obese, nondistended  Musculoskeletal: perfused, no clubbing   Data Reviewed: Basic Metabolic Panel:  Recent Labs Lab 03/13/14 2230 03/15/14 0642  NA 141 138  K 3.7 4.1  CL 105 103  CO2 24 23  GLUCOSE 103*  123*  BUN 19 12  CREATININE 1.12 0.90  CALCIUM 8.7 8.3*   Liver Function Tests:  Recent Labs Lab 03/15/14 0642  AST 10  ALT 15  ALKPHOS 78  BILITOT 0.7  PROT 6.4  ALBUMIN 2.7*   No results found for this basename: LIPASE, AMYLASE,  in the last 168 hours No results found for this basename: AMMONIA,  in the last 168 hours CBC:  Recent Labs Lab 03/13/14 2230 03/14/14 1144 03/15/14 0642  WBC 15.4* 17.5* 20.2*  NEUTROABS 12.1*  --   --   HGB 13.6 13.7 13.4  HCT 39.4 39.9 39.3  MCV 85.7 85.8 85.8  PLT 291 259 287   Cardiac Enzymes: No results found for this basename: CKTOTAL, CKMB, CKMBINDEX, TROPONINI,  in the last 168 hours BNP (last 3 results) No results found for this basename: PROBNP,  in the last 8760 hours CBG: No results found for this basename: GLUCAP,  in the last 168 hours  No results found for this or any previous visit (from the past 240 hour(s)).   Studies: Dg Chest 2 View  03/14/2014   CLINICAL DATA:  Productive cough with green sputum for 2 weeks. Chills. Sharp pain at the right lower chest with deep inspiration. Shortness of breath.  EXAM: CHEST  2 VIEW  COMPARISON:  CT abdomen and  pelvis 03/13/2014  FINDINGS: Linear infiltration in the lung bases corresponding with consolidation seen in the right lung base and atelectasis in the left lung base on previous CT. Shallow inspiration. Heart size and pulmonary vascularity are normal. Probable emphysematous changes in the upper lungs. Blunting of the right costophrenic angle suggesting small effusions.  IMPRESSION: Linear consolidation in the right lung base with small effusion consistent with pneumonia. Shallow inspiration. Atelectasis in the left base.   Electronically Signed   By: Burman NievesWilliam  Stevens M.D.   On: 03/14/2014 00:40   Ct Renal Stone Study  03/13/2014   CLINICAL DATA:  RIGHT flank pain beginning today, worsening throughout the days, intermittently unbearable. History of renal stones.  EXAM: CT ABDOMEN  AND PELVIS WITHOUT CONTRAST  TECHNIQUE: Multidetector CT imaging of the abdomen and pelvis was performed following the standard protocol without IV contrast.  COMPARISON:  None.  FINDINGS: LUNG BASES: Included view of the lung bases demonstrates RIGHT lung base dense consolidation. LEFT lung base atelectasis. Heart size is normal, included pericardium is unremarkable. The visualized heart and pericardium are unremarkable.  KIDNEYS/BLADDER: Kidneys are orthotopic, demonstrating normal size and morphology. Moderate LEFT hydronephrosis. No urolithiasis. Limited assessment for renal masses on this nonenhanced examination. The unopacified ureters are normal in course and caliber. Urinary bladder is partially distended and unremarkable.  SOLID ORGANS: The liver, spleen, gallbladder, pancreas and adrenal glands are unremarkable for this non-contrast examination.  GASTROINTESTINAL TRACT: The stomach, small and large bowel are normal in course and caliber without inflammatory changes, the sensitivity may be decreased by lack of enteric contrast. Normal appendix.  PERITONEUM/RETROPERITONEUM: Mild Misty mesentery and small, likely reactive lymph nodes. No intraperitoneal free fluid nor free air. Aortoiliac vessels are normal in course and caliber. No lymphadenopathy by CT size criteria. Internal reproductive organs are unremarkable.  SOFT TISSUES/ OSSEOUS STRUCTURES: Nonsuspicious. Moderate LEFT and small RIGHT fat containing inguinal hernia. Mild broad levoscoliosis.  IMPRESSION: Partially imaged RIGHT lower lobe pneumonia.  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 Recommend followup.  Mild misty mesentery is likely reactive.   Electronically Signed   By: Awilda Metroourtnay  Bloomer   On: 03/13/2014 23:57    Scheduled Meds: . atorvastatin  40 mg Oral QPC supper  . azithromycin  500 mg Intravenous Q24H  . cefTRIAXone (ROCEPHIN)  IV  1 g Intravenous QHS  . heparin  5,000 Units  Subcutaneous 3 times per day  . lisinopril  10 mg Oral Daily  . mometasone-formoterol  2 puff Inhalation BID  . montelukast  10 mg Oral QHS  . pantoprazole  40 mg Oral Daily  . senna  1 tablet Oral BID  . sodium chloride  3 mL Intravenous Q12H   Continuous Infusions: . sodium chloride 50 mL/hr at 03/15/14 [id number]    Active Problems:   CAP (community acquired pneumonia)   Hydronephrosis, left   Back pain   Hypertension   Sleep apnea   Hyperlipemia  Time spent: 35min  Dai Mcadams K  [facility] Pager [phone]. If 7PM-7AM, please contact night-coverage at [email or web address], password [email or web address] 03/15/2014, 9:13 AM  LOS: 2 days

## 2014-03-16 ENCOUNTER — Other Ambulatory Visit (HOSPITAL_COMMUNITY): Payer: Self-pay | Admitting: Cardiology

## 2014-03-16 LAB — BASIC METABOLIC PANEL
Anion gap: 12 (ref 5–15)
BUN: 11 mg/dL (ref 6–23)
CALCIUM: 9 mg/dL (ref 8.4–10.5)
CO2: 26 mEq/L (ref 19–32)
Chloride: 101 mEq/L (ref 96–112)
Creatinine, Ser: 0.93 mg/dL (ref 0.50–1.35)
Glucose, Bld: 115 mg/dL — ABNORMAL HIGH (ref 70–99)
POTASSIUM: 4.4 meq/L (ref 3.7–5.3)
Sodium: 139 mEq/L (ref 137–147)

## 2014-03-16 LAB — LEGIONELLA ANTIGEN, URINE

## 2014-03-16 LAB — CBC WITH DIFFERENTIAL/PLATELET
Basophils Absolute: 0.1 10*3/uL (ref 0.0–0.1)
Basophils Relative: 0 % (ref 0–1)
EOS PCT: 5 % (ref 0–5)
Eosinophils Absolute: 0.9 10*3/uL — ABNORMAL HIGH (ref 0.0–0.7)
HEMATOCRIT: 41.4 % (ref 39.0–52.0)
HEMOGLOBIN: 14 g/dL (ref 13.0–17.0)
LYMPHS PCT: 9 % — AB (ref 12–46)
Lymphs Abs: 1.6 10*3/uL (ref 0.7–4.0)
MCH: 29.2 pg (ref 26.0–34.0)
MCHC: 33.8 g/dL (ref 30.0–36.0)
MCV: 86.4 fL (ref 78.0–100.0)
MONO ABS: 1.6 10*3/uL — AB (ref 0.1–1.0)
MONOS PCT: 9 % (ref 3–12)
Neutro Abs: 14.4 10*3/uL — ABNORMAL HIGH (ref 1.7–7.7)
Neutrophils Relative %: 77 % (ref 43–77)
Platelets: 342 10*3/uL (ref 150–400)
RBC: 4.79 MIL/uL (ref 4.22–5.81)
RDW: 15.2 % (ref 11.5–15.5)
WBC: 18.6 10*3/uL — ABNORMAL HIGH (ref 4.0–10.5)

## 2014-03-16 LAB — TROPONIN I: Troponin I: <0.3 ng/mL (ref <0.30)

## 2014-03-16 LAB — EXPECTORATED SPUTUM ASSESSMENT W GRAM STAIN, RFLX TO RESP C

## 2014-03-16 LAB — EXPECTORATED SPUTUM ASSESSMENT W REFEX TO RESP CULTURE

## 2014-03-16 MED ORDER — KETOROLAC TROMETHAMINE 15 MG/ML IJ SOLN
15.0000 mg | Freq: Four times a day (QID) | INTRAMUSCULAR | Status: DC | PRN
  Administered 2014-03-16 – 2014-03-18 (×5): 15 mg via INTRAVENOUS
  Filled 2014-03-16 (×5): qty 1

## 2014-03-16 NOTE — Progress Notes (Signed)
[facility] PROGRESS NOTE  Jaclynn MajorBruce Kassel ZOX:[id number] DOB: 03/19/1961 DOA: 03/13/2014 PCP: No primary provider on file.  Assessment/Plan: 1. CAP 1. Pt is continued on azithromycin and rocephin 2. Remains afebrile overnight,leukocytosis improving to 20k->185k 3. Blood cx pending. UA unremarkable 4. Repeat cxr for interval change with findings suggestive of ?CHF. BNP is normal, as is TSH and serial trop 5. Recently added vanc to broaden abx spectrum 2. Hypoxia 1. Improved   3. Back pain 1. Cont pain meds as tolerated 4. L sided hydronephrosis 1. Pt voiding well 2. Normal renal function 3. Follow up as OP 5. HTN 1. BP stable 2. Cont current regimen 6. OSA 1. Cont cpap as tolerated 7. DVT prophylaxis 1. Heparin subQ  Code Status: Full Family Communication: Pt in room Disposition Plan: Pending   Consultants:    Procedures:    Antibiotics:  Azithromycin 10/2>>>  Rocepin 10/2>>>  Vancomycin 10/3>>>  HPI/Subjective: Reports feeling better. Feels less sob.  Objective: Filed Vitals:   03/15/14 1951 03/15/14 2300 03/16/14 0509 03/16/14 0819  BP:  136/66 133/64   Pulse:  87 76   Temp:  98.2 F (36.8 C) 98.2 F (36.8 C)   TempSrc:  Oral Oral   Resp:  20 16   Height:      Weight:      SpO2: 94% 94% 94% 93%    Intake/Output Summary (Last 24 hours) at 03/16/14 1029 Last data filed at 03/16/14 0931  Gross per 24 hour  Intake    600 ml  Output      0 ml  Net    600 ml   Filed Weights   03/13/14 2149 03/14/14 0156  Weight: 129.275 kg (285 lb) 130.6 kg (287 lb 14.7 oz)    Exam:   General:  Awake, in nad  Cardiovascular: regular,s1, s2  Respiratory: normal resp effort, no wheezing  Abdomen: soft, obese, nondistended  Musculoskeletal: perfused, no clubbing   Data Reviewed: Basic Metabolic Panel:  Recent Labs Lab 03/13/14 2230 03/15/14 0642 03/16/14 0616  NA 141 138 139  K 3.7 4.1 4.4  CL 105 103 101  CO2 24 23 26   GLUCOSE 103* 123*  115*  BUN 19 12 11   CREATININE 1.12 0.90 0.93  CALCIUM 8.7 8.3* 9.0   Liver Function Tests:  Recent Labs Lab 03/15/14 0642  AST 10  ALT 15  ALKPHOS 78  BILITOT 0.7  PROT 6.4  ALBUMIN 2.7*   No results found for this basename: LIPASE, AMYLASE,  in the last 168 hours No results found for this basename: AMMONIA,  in the last 168 hours CBC:  Recent Labs Lab 03/13/14 2230 03/14/14 1144 03/15/14 0642 03/16/14 0616  WBC 15.4* 17.5* 20.2* 18.6*  NEUTROABS 12.1*  --   --  14.4*  HGB 13.6 13.7 13.4 14.0  HCT 39.4 39.9 39.3 41.4  MCV 85.7 85.8 85.8 86.4  PLT 291 259 287 342   Cardiac Enzymes:  Recent Labs Lab 03/15/14 1411 03/15/14 2000 03/16/14 0204  TROPONINI <0.30 <0.30 <0.30   BNP (last 3 results)  Recent Labs  03/15/14 1411  PROBNP 60.3   CBG: No results found for this basename: GLUCAP,  in the last 168 hours  Recent Results (from the past 240 hour(s))  CULTURE, BLOOD (ROUTINE X 2)     Status: None   Collection Time    03/15/14 10:17 AM      Result Value Ref Range Status   Specimen Description LEFT ANTECUBITAL   Final  Special Requests BOTTLES DRAWN AEROBIC AND ANAEROBIC 8CC   Final   Culture NO GROWTH 1 DAY   Final   Report Status PENDING   Incomplete  CULTURE, BLOOD (ROUTINE X 2)     Status: None   Collection Time    03/15/14 10:17 AM      Result Value Ref Range Status   Specimen Description BLOOD LEFT HAND   Final   Special Requests BOTTLES DRAWN AEROBIC AND ANAEROBIC 6CC   Final   Culture NO GROWTH 1 DAY   Final   Report Status PENDING   Incomplete     Studies: Dg Chest Port 1 View  03/15/2014   CLINICAL DATA:  Right lower chest pain with inspiratory pleuritic component; shortness of breath. ; 2 week history of productive cough and recent diagnosis of community acquired pneumonia; history of asthma and hypertension ; no history of tobacco use  EXAM: PORTABLE CHEST - 1 VIEW  COMPARISON:  PA and lateral chest x-ray of March 14, 2014  FINDINGS:  The left lung is adequately inflated. There is no focal infiltrate. The interstitial markings are coarse. On the right there is confluent alveolar density in the mid and lower lung consistent with pleural fluid. There may be underlying pneumonia. The interstitial markings within the aerated portion of the right upper lobe are increased. The cardiopericardial silhouette is enlarged. The observed bony thorax is unremarkable.  IMPRESSION: Congestive heart failure with mild pulmonary interstitial edema especially on the right. Right basilar atelectasis or pneumonia may be present but obscured by the increasing right pleural effusion. These findings have appeared since yesterday's study.   Electronically Signed   By: David  [place]   On: 03/15/2014 11:33    Scheduled Meds: . atorvastatin  40 mg Oral QPC supper  . azithromycin  500 mg Intravenous Q24H  . cefTRIAXone (ROCEPHIN)  IV  1 g Intravenous QHS  . heparin  5,000 Units Subcutaneous 3 times per day  . lisinopril  10 mg Oral Daily  . mometasone-formoterol  2 puff Inhalation BID  . montelukast  10 mg Oral QHS  . pantoprazole  40 mg Oral Daily  . senna  1 tablet Oral BID  . sodium chloride  3 mL Intravenous Q12H  . vancomycin  2,000 mg Intravenous Once   Followed by  . vancomycin  1,250 mg Intravenous Q12H   Continuous Infusions: . sodium chloride 50 mL/hr at 03/16/14 0600    Active Problems:   CAP (community acquired pneumonia)   Hydronephrosis, left   Back pain   Hypertension   Sleep apnea   Hyperlipemia  Time spent:  Antrice Pal K  [facility] Pager [phone]. If 7PM-7AM, please contact night-coverage at [email or web address], password [facility] 03/16/2014, 10:29 AM  LOS: 3 days

## 2014-03-17 ENCOUNTER — Inpatient Hospital Stay (HOSPITAL_COMMUNITY): Payer: BC Managed Care – PPO

## 2014-03-17 ENCOUNTER — Encounter (HOSPITAL_COMMUNITY): Payer: Self-pay | Admitting: Internal Medicine

## 2014-03-17 DIAGNOSIS — E669 Obesity, unspecified: Secondary | ICD-10-CM

## 2014-03-17 LAB — CBC WITH DIFFERENTIAL/PLATELET
Basophils Absolute: 0.1 10*3/uL (ref 0.0–0.1)
Basophils Relative: 0 % (ref 0–1)
EOS ABS: 1 10*3/uL — AB (ref 0.0–0.7)
Eosinophils Relative: 7 % — ABNORMAL HIGH (ref 0–5)
HCT: 38.4 % — ABNORMAL LOW (ref 39.0–52.0)
Hemoglobin: 13 g/dL (ref 13.0–17.0)
Lymphocytes Relative: 10 % — ABNORMAL LOW (ref 12–46)
Lymphs Abs: 1.6 10*3/uL (ref 0.7–4.0)
MCH: 29.1 pg (ref 26.0–34.0)
MCHC: 33.9 g/dL (ref 30.0–36.0)
MCV: 86.1 fL (ref 78.0–100.0)
Monocytes Absolute: 1.5 10*3/uL — ABNORMAL HIGH (ref 0.1–1.0)
Monocytes Relative: 10 % (ref 3–12)
Neutro Abs: 11 10*3/uL — ABNORMAL HIGH (ref 1.7–7.7)
Neutrophils Relative %: 73 % (ref 43–77)
PLATELETS: 363 10*3/uL (ref 150–400)
RBC: 4.46 MIL/uL (ref 4.22–5.81)
RDW: 15.1 % (ref 11.5–15.5)
WBC: 15.1 10*3/uL — AB (ref 4.0–10.5)

## 2014-03-17 LAB — BASIC METABOLIC PANEL
Anion gap: 11 (ref 5–15)
BUN: 13 mg/dL (ref 6–23)
CO2: 27 mEq/L (ref 19–32)
Calcium: 8.9 mg/dL (ref 8.4–10.5)
Chloride: 103 mEq/L (ref 96–112)
Creatinine, Ser: 1.01 mg/dL (ref 0.50–1.35)
GFR calc Af Amer: >90 mL/min (ref >90)
GFR, EST NON AFRICAN AMERICAN: 84 mL/min — AB (ref >90)
Glucose, Bld: 108 mg/dL — ABNORMAL HIGH (ref 70–99)
POTASSIUM: 4 meq/L (ref 3.7–5.3)
SODIUM: 141 meq/L (ref 137–147)

## 2014-03-17 MED ORDER — AZITHROMYCIN 250 MG PO TABS
500.0000 mg | ORAL_TABLET | Freq: Every day | ORAL | Status: DC
  Administered 2014-03-17 – 2014-03-18 (×2): 500 mg via ORAL
  Filled 2014-03-17 (×2): qty 2

## 2014-03-17 MED ORDER — CEFUROXIME AXETIL 250 MG PO TABS
250.0000 mg | ORAL_TABLET | Freq: Two times a day (BID) | ORAL | Status: DC
  Administered 2014-03-17 – 2014-03-18 (×2): 250 mg via ORAL
  Filled 2014-03-17 (×2): qty 1

## 2014-03-17 NOTE — Progress Notes (Signed)
Late entry:  03/16/14 @ 2000 At rest oxygen saturations are 93% room air. Patient ambulated [place] with oxygen saturations at 93% room air. No acute distress noted. Will continue to monitor.

## 2014-03-17 NOTE — Progress Notes (Signed)
Pt seen and examined. Agree with above assessment and plan per [name], NP. Briefly, pt presents with CAP, slowly improving with IV abx. Plan to transition to PO abx today and monitor overnight. If stable tomorrow, consider d/c tomorrow with close outpatient follow up.

## 2014-03-17 NOTE — Progress Notes (Signed)
[facility] PROGRESS NOTE  [name] MajorBruce Malcomb ZOX:[id number]:[id number] DOB: 10/29/1960 DOA: 03/13/2014 PCP: No primary provider on file.  Assessment/Plan: 1. CAP  1. Max temp 99.1 over last 24 hour. ,leukocytosis improving to 15.1k 2. Blood cx no growth to date, UA unremarkable 3. Await repeat  cxr for interval change results.  4. Will discontinue vanc and rocephin and change azithromycin to po. Add ceftin. Monitor for fever, worsening leukocytosis 2. Hypoxia  1. Resolved   3. Back pain  1. Cont pain meds as tolerated 2. controlled 4. L sided hydronephrosis  1. Pt continues to void 2. Normal renal function 3. Follow up as OP 5. HTN  1. BP stable 2. Cont current regimen 6. OSA  1. Cont cpap as tolerated 7. DVT prophylaxis  1. Heparin subQ  Obesity: BMI 42.6. Will request nutritional consult    Code Status: full Family Communication: wife at bedside Disposition Plan: home tomorrow   Consultants:    Procedures:    Antibiotics: Azithromycin 10/2>>>  Rocepin 10/2>>> 03/17/14 Vancomycin 10/3>>>03/17/14 ceftin 03/17/14>>>   HPI/Subjective: Sitting on side of bed smiling. Reports feeling "much better".   Objective: Filed Vitals:   03/17/14 0527  BP: 147/78  Pulse: 80  Temp: 98.1 F (36.7 C)  Resp: 20    Intake/Output Summary (Last 24 hours) at 03/17/14 1057 Last data filed at 03/16/14 1824  Gross per 24 hour  Intake   1550 ml  Output      0 ml  Net   1550 ml   Filed Weights   03/13/14 2149 03/14/14 0156  Weight: 129.275 kg (285 lb) 130.6 kg (287 lb 14.7 oz)    Exam:   General:  Obese appears comfortable  Cardiovascular: RRR No MGR No LE edema  Respiratory: normal effort BS diminished on right base otherwise good air flow with no wheeze/crackles  Abdomen: obese soft +BS non-tender to palpation non-distended  Musculoskeletal: no clubbing cyanosis  Data Reviewed: Basic Metabolic Panel:  Recent Labs Lab 03/13/14 2230 03/15/14 0642 03/16/14 0616  03/17/14 0541  NA 141 138 139 141  K 3.7 4.1 4.4 4.0  CL 105 103 101 103  CO2 24 23 26 27   GLUCOSE 103* 123* 115* 108*  BUN 19 12 11 13   CREATININE 1.12 0.90 0.93 1.01  CALCIUM 8.7 8.3* 9.0 8.9   Liver Function Tests:  Recent Labs Lab 03/15/14 0642  AST 10  ALT 15  ALKPHOS 78  BILITOT 0.7  PROT 6.4  ALBUMIN 2.7*   No results found for this basename: LIPASE, AMYLASE,  in the last 168 hours No results found for this basename: AMMONIA,  in the last 168 hours CBC:  Recent Labs Lab 03/13/14 2230 03/14/14 1144 03/15/14 0642 03/16/14 0616 03/17/14 0541  WBC 15.4* 17.5* 20.2* 18.6* 15.1*  NEUTROABS 12.1*  --   --  14.4* 11.0*  HGB 13.6 13.7 13.4 14.0 13.0  HCT 39.4 39.9 39.3 41.4 38.4*  MCV 85.7 85.8 85.8 86.4 86.1  PLT 291 259 287 342 363   Cardiac Enzymes:  Recent Labs Lab 03/15/14 1411 03/15/14 2000 03/16/14 0204  TROPONINI <0.30 <0.30 <0.30   BNP (last 3 results)  Recent Labs  03/15/14 1411  PROBNP 60.3   CBG: No results found for this basename: GLUCAP,  in the last 168 hours  Recent Results (from the past 240 hour(s))  CULTURE, BLOOD (ROUTINE X 2)     Status: None   Collection Time    03/15/14 10:17 AM  Result Value Ref Range Status   Specimen Description LEFT ANTECUBITAL   Final   Special Requests BOTTLES DRAWN AEROBIC AND ANAEROBIC 8CC   Final   Culture NO GROWTH 2 DAYS   Final   Report Status PENDING   Incomplete  CULTURE, BLOOD (ROUTINE X 2)     Status: None   Collection Time    03/15/14 10:17 AM      Result Value Ref Range Status   Specimen Description BLOOD LEFT HAND   Final   Special Requests BOTTLES DRAWN AEROBIC AND ANAEROBIC 6CC   Final   Culture NO GROWTH 2 DAYS   Final   Report Status PENDING   Incomplete  CULTURE, EXPECTORATED SPUTUM-ASSESSMENT     Status: None   Collection Time    03/16/14 10:30 AM      Result Value Ref Range Status   Specimen Description SPUTUM   Final   Special Requests NONE   Final   Sputum  evaluation     Final   Value: THIS SPECIMEN IS ACCEPTABLE. RESPIRATORY CULTURE REPORT TO FOLLOW.   Report Status 03/16/2014 FINAL   Final  CULTURE, RESPIRATORY (NON-EXPECTORATED)     Status: None   Collection Time    03/16/14 10:30 AM      Result Value Ref Range Status   Specimen Description SPUTUM   Final   Special Requests NONE   Final   Gram Stain     Final   Value: MODERATE WBC PRESENT, PREDOMINANTLY PMN     RARE SQUAMOUS EPITHELIAL CELLS PRESENT     FEW GRAM POSITIVE COCCI IN PAIRS     RARE GRAM NEGATIVE RODS     Performed at [facility]   Culture PENDING   Incomplete   Report Status PENDING   Incomplete     Studies: No results found.  Scheduled Meds: . atorvastatin  40 mg Oral QPC supper  . azithromycin  500 mg Oral Daily  . cefUROXime  250 mg Oral BID WC  . heparin  5,000 Units Subcutaneous 3 times per day  . lisinopril  10 mg Oral Daily  . mometasone-formoterol  2 puff Inhalation BID  . montelukast  10 mg Oral QHS  . pantoprazole  40 mg Oral Daily  . senna  1 tablet Oral BID  . sodium chloride  3 mL Intravenous Q12H  . vancomycin  2,000 mg Intravenous Once   Continuous Infusions:   Active Problems:   CAP (community acquired pneumonia)   Hydronephrosis, left   Back pain   Hypertension   Sleep apnea   Hyperlipemia   Obesity    Time spent: 35 mintues    [facility] M  [facility] Pager [phone]. If 7PM-7AM, please contact night-coverage at [email or web address], password [facility] 03/17/2014, 10:57 AM  LOS: 4 days

## 2014-03-17 NOTE — Progress Notes (Signed)
Patient continues to use his home CPAP unit with 3lpm bled into unit.

## 2014-03-18 ENCOUNTER — Inpatient Hospital Stay (HOSPITAL_COMMUNITY): Payer: BC Managed Care – PPO

## 2014-03-18 DIAGNOSIS — J90 Pleural effusion, not elsewhere classified: Secondary | ICD-10-CM

## 2014-03-18 DIAGNOSIS — J869 Pyothorax without fistula: Secondary | ICD-10-CM | POA: Diagnosis not present

## 2014-03-18 LAB — CBC
HCT: 34.3 % — ABNORMAL LOW (ref 39.0–52.0)
Hemoglobin: 11.6 g/dL — ABNORMAL LOW (ref 13.0–17.0)
MCH: 28.7 pg (ref 26.0–34.0)
MCHC: 33.8 g/dL (ref 30.0–36.0)
MCV: 84.9 fL (ref 78.0–100.0)
PLATELETS: 367 10*3/uL (ref 150–400)
RBC: 4.04 MIL/uL — ABNORMAL LOW (ref 4.22–5.81)
RDW: 15 % (ref 11.5–15.5)
WBC: 13.5 10*3/uL — ABNORMAL HIGH (ref 4.0–10.5)

## 2014-03-18 MED ORDER — IOHEXOL 300 MG/ML  SOLN
80.0000 mL | Freq: Once | INTRAMUSCULAR | Status: AC | PRN
  Administered 2014-03-18: 80 mL via INTRAVENOUS

## 2014-03-18 MED ORDER — VANCOMYCIN HCL 10 G IV SOLR
2000.0000 mg | Freq: Once | INTRAVENOUS | Status: AC
  Administered 2014-03-18: 2000 mg via INTRAVENOUS
  Filled 2014-03-18: qty 2000

## 2014-03-18 MED ORDER — AZITHROMYCIN 500 MG PO TABS: ORAL_TABLET | ORAL | Status: DC

## 2014-03-18 MED ORDER — CEFUROXIME AXETIL 250 MG PO TABS: 250.0000 mg | ORAL_TABLET | Freq: Two times a day (BID) | ORAL | Status: DC

## 2014-03-18 MED ORDER — HYDROCODONE-ACETAMINOPHEN 5-325 MG PO TABS: 1.0000 | ORAL_TABLET | Freq: Four times a day (QID) | ORAL | Status: DC | PRN

## 2014-03-18 MED ORDER — CEFTRIAXONE SODIUM 1 G IJ SOLR
1.0000 g | INTRAMUSCULAR | Status: DC
  Administered 2014-03-18 – 2014-03-19 (×2): 1 g via INTRAVENOUS
  Filled 2014-03-18 (×3): qty 10

## 2014-03-18 MED ORDER — VANCOMYCIN HCL 10 G IV SOLR
1250.0000 mg | Freq: Two times a day (BID) | INTRAVENOUS | Status: DC
  Administered 2014-03-19 – 2014-03-20 (×3): 1250 mg via INTRAVENOUS
  Filled 2014-03-18 (×6): qty 1250

## 2014-03-18 MED ORDER — AZITHROMYCIN 500 MG IV SOLR
500.0000 mg | INTRAVENOUS | Status: DC
  Administered 2014-03-19: 500 mg via INTRAVENOUS
  Filled 2014-03-18 (×3): qty 500

## 2014-03-18 NOTE — Progress Notes (Signed)
ANTIBIOTIC CONSULT NOTE - INITIAL  Pharmacy Consult for Vancomycin & Ceftriaxone Indication: pneumonia  No Known Allergies  Patient Measurements: Height: 5\' 9"  (175.3 cm) Weight: 287 lb 14.7 oz (130.6 kg) IBW/kg (Calculated) : 70.7 Adjusted Body Weight:   Vital Signs: Temp: 98.5 F (36.9 C) (10/06 0633) Temp Source: Oral (10/06 16100633) BP: 115/63 mmHg (10/06 96040633) Pulse Rate: 92 (10/06 0918) Intake/Output from previous day:   Intake/Output from this shift:    Labs:  Recent Labs  03/16/14 0616 03/17/14 0541 03/18/14 0546  WBC 18.6* 15.1* 13.5*  HGB 14.0 13.0 11.6*  PLT 342 363 367  CREATININE 0.93 1.01  --    Estimated Creatinine Clearance: 114.6 ml/min (by C-G formula based on Cr of 1.01). No results found for this basename: VANCOTROUGH, Leodis BinetVANCOPEAK, VANCORANDOM, GENTTROUGH, GENTPEAK, GENTRANDOM, TOBRATROUGH, TOBRAPEAK, TOBRARND, AMIKACINPEAK, AMIKACINTROU, AMIKACIN,  in the last 72 hours   Microbiology: Recent Results (from the past 720 hour(s))  CULTURE, BLOOD (ROUTINE X 2)     Status: None   Collection Time    03/15/14 10:17 AM      Result Value Ref Range Status   Specimen Description BLOOD LEFT ANTECUBITAL   Final   Special Requests BOTTLES DRAWN AEROBIC AND ANAEROBIC 8CC   Final   Culture NO GROWTH 3 DAYS   Final   Report Status PENDING   Incomplete  CULTURE, BLOOD (ROUTINE X 2)     Status: None   Collection Time    03/15/14 10:17 AM      Result Value Ref Range Status   Specimen Description BLOOD LEFT HAND   Final   Special Requests BOTTLES DRAWN AEROBIC AND ANAEROBIC 6CC   Final   Culture NO GROWTH 3 DAYS   Final   Report Status PENDING   Incomplete  CULTURE, EXPECTORATED SPUTUM-ASSESSMENT     Status: None   Collection Time    03/16/14 10:30 AM      Result Value Ref Range Status   Specimen Description SPUTUM   Final   Special Requests NONE   Final   Sputum evaluation     Final   Value: THIS SPECIMEN IS ACCEPTABLE. RESPIRATORY CULTURE REPORT TO FOLLOW.    Report Status 03/16/2014 FINAL   Final  CULTURE, RESPIRATORY (NON-EXPECTORATED)     Status: None   Collection Time    03/16/14 10:30 AM      Result Value Ref Range Status   Specimen Description SPUTUM   Final   Special Requests NONE   Final   Gram Stain     Final   Value: MODERATE WBC PRESENT, PREDOMINANTLY PMN     RARE SQUAMOUS EPITHELIAL CELLS PRESENT     FEW GRAM POSITIVE COCCI IN PAIRS     RARE GRAM NEGATIVE RODS     [facility]   Culture     Final   Value: NORMAL OROPHARYNGEAL FLORA     [facility]formed at [facility]   Report Status PENDING   Incomplete    Medical History: Past Medical History  Diagnosis Date  . Hypertension   . Kidney stone   . Sleep apnea   . Asthma   . Seasonal allergies   . Hyperlipemia   . Psoriasis   . Immune deficiency disorder   . Obesity     BMI 42.6    Medications:  Scheduled:  . atorvastatin  40 mg Oral QPC supper  . [START ON 03/19/2014] azithromycin  500 mg Intravenous Q24H  . cefTRIAXone (  ROCEPHIN)  IV  1 g Intravenous Q24H  . heparin  5,000 Units Subcutaneous 3 times per day  . lisinopril  10 mg Oral Daily  . mometasone-formoterol  2 puff Inhalation BID  . montelukast  10 mg Oral QHS  . pantoprazole  40 mg Oral Daily  . senna  1 tablet Oral BID  . sodium chloride  3 mL Intravenous Q12H  . [START ON 03/19/2014] vancomycin  1,250 mg Intravenous Q12H  . vancomycin  2,000 mg Intravenous Once   Assessment: CT chest results. Large L sided loculated effusion with findings suggestive of infective/empyema. Resume IV antibiotics, discontinue oral antibiotics Patient to be transferred to [facility], due to need of higher level of care  Goal of Therapy:  Vancomycin trough level 15-20 mcg/ml  Plan:  Vancomycin 2 GM IV loading dose, then 1250 mg IV every 12 hours Vancomycin trough at steady state Ceftriaxone 1 GM IV every 24 hours Monitor renal function Labs per protocol  [name] [name], [name] [name] 03/18/2014,4:29  PM

## 2014-03-18 NOTE — Discharge Summary (Signed)
Physician Discharge Summary  [name] [name]:[id number] DOB: 05/19/1961 DOA: 03/13/2014  PCP: No primary provider on file.  Admit date: 03/13/2014 Discharge date: 03/18/2014  Time spent: 40 minutes  Recommendations for Outpatient Follow-up:  1. Has appointment with Denton Meek NP at [facility] office. Recommend evaluation of resolution of pneumonia as well as monitoring oxygen saturation level to evaluate need for continued oxygen supplementation. Recommend repeat chest xray or CT chest to follow right loculated pleural effusion.  2. Recommend BMET to track kidney function 3. Follow left sided hydronephrosis 4. Home oxygen per HH 5. Evaluation for return to work.   Discharge Diagnoses:  Active Problems:   CAP (community acquired pneumonia)   Hydronephrosis, left   Back pain   Hypertension   Sleep apnea   Hyperlipemia   Obesity   Discharge Condition: stable  Diet recommendation: heart healthy  Filed Weights   03/13/14 2149 03/14/14 0156  Weight: 129.275 kg (285 lb) 130.6 kg (287 lb 14.7 oz)    History of present illness:  [name] [name] is a 53 y.o. male came to [facility] ed 03/14/2014 with  R flank pain to mid back pain. Getting worse. No w/ R lower chest pain. Worse w/ deep inspiration. Advil 600mg  w/ some benefit. Denied fevers, rash, dysuria, gross hematuria, palpitations, CP. Mild SOB. CPAP at night but no daytime O2. URI w/ cough for past 2 wks.  Hospital Course:  1. CAP  1. Afebrile at discharge. Leukocytosis trending down to 13.5 from 19 on admission. He is hemodynamically stable and non-toxic appearing at discharge.  2. Blood cx no growth to date, UA unremarkable 3. Repeat cxr on 03/17/14 yields RLL pneumonia and right loculated pleural effusion. Recommend repeat chest xray or CT of chest as OP. Follow up with PCP scheduled 03/21/14  4. He received IV antibiotics for 5 days. Will discharge with 5 more days of azithromycin and ceftin to complete 10 day course.  5. Hypoxia: at discharge  oxygen saturation level dropped with ambulation on room air to 88%. Quickly recovered to 93%. Will discharge on home oxygen with activity. Follow up PCP 03/21/14 for evaluation of resolution of hypoxia 2. Back pain  1. Remained stable at baseline during this hospitalization 3. L sided hydronephrosis  1. Pt continued to void 2. Normal renal function 3. Follow up as OP 4. HTN  1. BP remained stable 5. OSA  1. Cont cpap.  Obesity: BMI 42.6. Will request nutritional consult    Procedures:  none  Consultations:  none  Discharge Exam: Filed Vitals:   03/18/14 0918  BP:   Pulse: 92  Temp:   Resp:     General: well nourished appears comfortable Cardiovascular: RRR No m/g/r. No LE edema Respiratory: normal effort with conversation. BS somewhat diminished on right, good air flow otherwise. No wheeze or crackles  Discharge Instructions You were cared for by a hospitalist during your hospital stay. If you have any questions about your discharge medications or the care you received while you were in the hospital after you are discharged, you can call the unit and asked to speak with the hospitalist on call if the hospitalist that took care of you is not available. Once you are discharged, your primary care physician will handle any further medical issues. Please note that NO REFILLS for any discharge medications will be authorized once you are discharged, as it is imperative that you return to your primary care physician (or establish a relationship with a primary care physician if you  do not have one) for your aftercare needs so that they can reassess your need for medications and monitor your lab values.   Current Discharge Medication List    START taking these medications   Details  azithromycin (ZITHROMAX) 500 MG tablet Take 1 tab daily. Start 10/7 and end 10/11. Qty: 5 each, Refills: 0    cefUROXime (CEFTIN) 250 MG tablet Take 1 tablet (250 mg total) by mouth 2 (two) times daily  with a meal. Qty: 10 tablet, Refills: 0    HYDROcodone-acetaminophen (NORCO/VICODIN) 5-325 MG per tablet Take 1-2 tablets by mouth every 6 (six) hours as needed for severe pain. Qty: 15 tablet, Refills: 0      CONTINUE these medications which have NOT CHANGED   Details  atorvastatin (LIPITOR) 40 MG tablet Take 40 mg by mouth daily.    Fluticasone-Salmeterol (ADVAIR) 250-50 MCG/DOSE AEPB Inhale 1 puff into the lungs 2 (two) times daily.    lisinopril (PRINIVIL,ZESTRIL) 10 MG tablet Take 10 mg by mouth daily.    mometasone (NASONEX) 50 MCG/ACT nasal spray Place 2 sprays into the nose daily.    montelukast (SINGULAIR) 10 MG tablet Take 10 mg by mouth at bedtime.    omeprazole (PRILOSEC) 20 MG capsule Take 20 mg by mouth daily.    zolpidem (AMBIEN) 10 MG tablet Take 10 mg by mouth at bedtime as needed for sleep.       No Known Allergies Follow-up Information   Follow up with Denton Meek NP at Dr Rema Fendt office as he is out of town this week On 03/21/2014. (appointment with Denton Meek at 11am. recommend follow up chest xray to evaluate resolution of pneumonia as well as oxygen saturation level monitoring to evaluate continued need for oxygen. )        The results of significant diagnostics from this hospitalization (including imaging, microbiology, ancillary and laboratory) are listed below for reference.    Significant Diagnostic Studies: Dg Chest 2 View  03/14/2014   CLINICAL DATA:  Productive cough with green sputum for 2 weeks. Chills. Sharp pain at the right lower chest with deep inspiration. Shortness of breath.  EXAM: CHEST  2 VIEW  COMPARISON:  CT abdomen and pelvis 03/13/2014  FINDINGS: Linear infiltration in the lung bases corresponding with consolidation seen in the right lung base and atelectasis in the left lung base on previous CT. Shallow inspiration. Heart size and pulmonary vascularity are normal. Probable emphysematous changes in the upper lungs. Blunting of the right  costophrenic angle suggesting small effusions.  IMPRESSION: Linear consolidation in the right lung base with small effusion consistent with pneumonia. Shallow inspiration. Atelectasis in the left base.   Electronically Signed   By: Burman Nieves M.D.   On: 03/14/2014 00:40   Dg Chest Port 1 View  03/17/2014   CLINICAL DATA:  Pneumonia. History of sleep apnea, asthma, nonsmoker  EXAM: PORTABLE CHEST - 1 VIEW  COMPARISON:  03/15/2014  FINDINGS: Right basilar opacity most consistent with pneumonia. There is a loculated moderate size right pleural effusion unchanged compared with 03/15/2014. The left lung is clear. Stable cardiomediastinal silhouette. Unremarkable osseous structures.  IMPRESSION: Right lower lobe pneumonia with a loculated moderate size right pleural effusion.   Electronically Signed   By: Elige Ko   On: 03/17/2014 11:35   Dg Chest Port 1 View  03/15/2014   CLINICAL DATA:  Right lower chest pain with inspiratory pleuritic component; shortness of breath. ; 2 week history of productive cough and recent diagnosis  of community acquired pneumonia; history of asthma and hypertension ; no history of tobacco use  EXAM: PORTABLE CHEST - 1 VIEW  COMPARISON:  PA and lateral chest x-ray of March 14, 2014  FINDINGS: The left lung is adequately inflated. There is no focal infiltrate. The interstitial markings are coarse. On the right there is confluent alveolar density in the mid and lower lung consistent with pleural fluid. There may be underlying pneumonia. The interstitial markings within the aerated portion of the right upper lobe are increased. The cardiopericardial silhouette is enlarged. The observed bony thorax is unremarkable.  IMPRESSION: Congestive heart failure with mild pulmonary interstitial edema especially on the right. Right basilar atelectasis or pneumonia may be present but obscured by the increasing right pleural effusion. These findings have appeared since yesterday's study.    Electronically Signed   By: David  Swazil[place]   On: 03/15/2014 11:33   Ct Renal Stone Study  03/13/2014   CLINICAL DATA:  RIGHT flank pain beginning today, worsening throughout the days, intermittently unbearable. History of renal stones.  EXAM: CT ABDOMEN AND PELVIS WITHOUT CONTRAST  TECHNIQUE: Multidetector CT imaging of the abdomen and pelvis was performed following the standard protocol without IV contrast.  COMPARISON:  None.  FINDINGS: LUNG BASES: Included view of the lung bases demonstrates RIGHT lung base dense consolidation. LEFT lung base atelectasis. Heart size is normal, included pericardium is unremarkable. The visualized heart and pericardium are unremarkable.  KIDNEYS/BLADDER: Kidneys are orthotopic, demonstrating normal size and morphology. Moderate LEFT hydronephrosis. No urolithiasis. Limited assessment for renal masses on this nonenhanced examination. The unopacified ureters are normal in course and caliber. Urinary bladder is partially distended and unremarkable.  SOLID ORGANS: The liver, spleen, gallbladder, pancreas and adrenal glands are unremarkable for this non-contrast examination.  GASTROINTESTINAL TRACT: The stomach, small and large bowel are normal in course and caliber without inflammatory changes, the sensitivity may be decreased by lack of enteric contrast. Normal appendix.  PERITONEUM/RETROPERITONEUM: Mild Misty mesentery and small, likely reactive lymph nodes. No intraperitoneal free fluid nor free air. Aortoiliac vessels are normal in course and caliber. No lymphadenopathy by CT size criteria. Internal reproductive organs are unremarkable.  SOFT TISSUES/ OSSEOUS STRUCTURES: Nonsuspicious. Moderate LEFT and small RIGHT fat containing inguinal hernia. Mild broad levoscoliosis.  IMPRESSION: Partially imaged RIGHT lower lobe pneumonia.  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 Recommend followup.  Mild misty mesentery is likely reactive.    Electronically Signed   By: Awilda Metroourtnay  Bloomer   On: 03/13/2014 23:57    Microbiology: Recent Results (from the past 240 hour(s))  CULTURE, BLOOD (ROUTINE X 2)     Status: None   Collection Time    03/15/14 10:17 AM      Result Value Ref Range Status   Specimen Description LEFT ANTECUBITAL   Final   Special Requests BOTTLES DRAWN AEROBIC AND ANAEROBIC 8CC   Final   Culture NO GROWTH 2 DAYS   Final   Report Status PENDING   Incomplete  CULTURE, BLOOD (ROUTINE X 2)     Status: None   Collection Time    03/15/14 10:17 AM      Result Value Ref Range Status   Specimen Description BLOOD LEFT HAND   Final   Special Requests BOTTLES DRAWN AEROBIC AND ANAEROBIC 6CC   Final   Culture NO GROWTH 2 DAYS   Final   Report Status PENDING   Incomplete  CULTURE, EXPECTORATED SPUTUM-ASSESSMENT     Status: None  Collection Time    03/16/14 10:30 AM      Result Value Ref Range Status   Specimen Description SPUTUM   Final   Special Requests NONE   Final   Sputum evaluation     Final   Value: THIS SPECIMEN IS ACCEPTABLE. RESPIRATORY CULTURE REPORT TO FOLLOW.   Report Status 03/16/2014 FINAL   Final  CULTURE, RESPIRATORY (NON-EXPECTORATED)     Status: None   Collection Time    03/16/14 10:30 AM      Result Value Ref Range Status   Specimen Description SPUTUM   Final   Special Requests NONE   Final   Gram Stain     Final   Value: MODERATE WBC PRESENT, PREDOMINANTLY PMN     RARE SQUAMOUS EPITHELIAL CELLS PRESENT     FEW GRAM POSITIVE COCCI IN PAIRS     RARE GRAM NEGATIVE RODS     Performed at [facility]   Culture     Final   Value: NORMAL OROPHARYNGEAL FLORA     Performed at [facility]   Report Status PENDING   Incomplete     Labs: Basic Metabolic Panel:  Recent Labs Lab 03/13/14 2230 03/15/14 0642 03/16/14 0616 03/17/14 0541  NA 141 138 139 141  K 3.7 4.1 4.4 4.0  CL 105 103 101 103  CO2 24 23 26 27   GLUCOSE 103* 123* 115* 108*  BUN 19 12 11 13   CREATININE  1.12 0.90 0.93 1.01  CALCIUM 8.7 8.3* 9.0 8.9   Liver Function Tests:  Recent Labs Lab 03/15/14 0642  AST 10  ALT 15  ALKPHOS 78  BILITOT 0.7  PROT 6.4  ALBUMIN 2.7*   No results found for this basename: LIPASE, AMYLASE,  in the last 168 hours No results found for this basename: AMMONIA,  in the last 168 hours CBC:  Recent Labs Lab 03/13/14 2230 03/14/14 1144 03/15/14 0642 03/16/14 0616 03/17/14 0541 03/18/14 0546  WBC 15.4* 17.5* 20.2* 18.6* 15.1* 13.5*  NEUTROABS 12.1*  --   --  14.4* 11.0*  --   HGB 13.6 13.7 13.4 14.0 13.0 11.6*  HCT 39.4 39.9 39.3 41.4 38.4* 34.3*  MCV 85.7 85.8 85.8 86.4 86.1 84.9  PLT 291 259 287 342 363 367   Cardiac Enzymes:  Recent Labs Lab 03/15/14 1411 03/15/14 2000 03/16/14 0204  TROPONINI <0.30 <0.30 <0.30   BNP: BNP (last 3 results)  Recent Labs  03/15/14 1411  PROBNP 60.3   CBG: No results found for this basename: GLUCAP,  in the last 168 hours     Signed:  Gwenyth Bender  [facility] 03/18/2014, 9:47 AM

## 2014-03-18 NOTE — Plan of Care (Addendum)
Brief Summary: The patient is a 53yo who initially presented with concerns of CAP. He was started on empiric azithromycin and rocephin and later vancomycin with rapid improvement in leukocytosis and symptoms. The patient's O2 requirements were gradually weaned to RA and the patient was initially considered for discharge. However, on the day of discharge, the patient was noted to have slightly worsened O2 requirements such that he would meet requirements for home O2. A CT chest was obtained which demonstrated a large loculated L sided effusion with findings suggestive of infection/empyema. The patient currently remains hemodynamically stable on minimal O2 support and ambulating without difficulty in the room. Unfortunately, as neither CT surgery nor IR is available at this facility, the patient will warrant transfer to a higher level of care. The case was discussed with CT surgery, who will see patient when he arrives to [place] [facility] ([facility] The case was also discussed with the accepting physician, Dr. Benjamine MolaVann, who accepts patient in transfer. I have updated the patient and family with this plan and they are in full agreement. For now, will d/c PO abx and resume IV abx given the above findings. [facility].

## 2014-03-18 NOTE — Discharge Summary (Addendum)
Pt seen and examined. Agree with above assessment and plan. Pt presents with CAP and found to be O2 dependent. Pt noted to have mod loculated pleural effusion. Pt was continued on broad IV abx with continued improvement in symptoms and decreased O2 requirements to room air. On the day of discharge, the patient was noted to have o2 sats of 88% on RA on ambulation. Given increased O2 requirements, will check CT chest and consider diagnostic/therapeutic thoracentesis pending results.

## 2014-03-18 NOTE — Progress Notes (Signed)
[facility]  Patient educated on procedure and all belongings at bedside.  IV medications infusing and preparing for [facility] to transport patient.  Will continue to monitor.

## 2014-03-18 NOTE — Care Management Utilization Note (Signed)
UR completed 

## 2014-03-19 DIAGNOSIS — J869 Pyothorax without fistula: Secondary | ICD-10-CM

## 2014-03-19 DIAGNOSIS — J9601 Acute respiratory failure with hypoxia: Secondary | ICD-10-CM

## 2014-03-19 LAB — BLOOD GAS, ARTERIAL
Acid-Base Excess: 0.8 mmol/L (ref 0.0–2.0)
Bicarbonate: 24.2 mEq/L — ABNORMAL HIGH (ref 20.0–24.0)
Drawn by: 24610
O2 Saturation: 91.6 %
Patient temperature: 98.6
TCO2: 25.3 mmol/L (ref 0–100)
pCO2 arterial: 34.5 mmHg — ABNORMAL LOW (ref 35.0–45.0)
pH, Arterial: 7.46 — ABNORMAL HIGH (ref 7.350–7.450)
pO2, Arterial: 63.4 mmHg — ABNORMAL LOW (ref 80.0–100.0)

## 2014-03-19 LAB — CBC
HCT: 37.7 % — ABNORMAL LOW (ref 39.0–52.0)
Hemoglobin: 12.9 g/dL — ABNORMAL LOW (ref 13.0–17.0)
MCH: 28.9 pg (ref 26.0–34.0)
MCHC: 34.2 g/dL (ref 30.0–36.0)
MCV: 84.3 fL (ref 78.0–100.0)
Platelets: 426 10*3/uL — ABNORMAL HIGH (ref 150–400)
RBC: 4.47 MIL/uL (ref 4.22–5.81)
RDW: 15 % (ref 11.5–15.5)
WBC: 14.9 10*3/uL — ABNORMAL HIGH (ref 4.0–10.5)

## 2014-03-19 LAB — SURGICAL PCR SCREEN
MRSA, PCR: POSITIVE — AB
Staphylococcus aureus: POSITIVE — AB

## 2014-03-19 LAB — COMPREHENSIVE METABOLIC PANEL
ALT: 31 U/L (ref 0–53)
AST: 25 U/L (ref 0–37)
Albumin: 2.7 g/dL — ABNORMAL LOW (ref 3.5–5.2)
Alkaline Phosphatase: 112 U/L (ref 39–117)
Anion gap: 12 (ref 5–15)
BUN: 14 mg/dL (ref 6–23)
CO2: 28 mEq/L (ref 19–32)
Calcium: 8.9 mg/dL (ref 8.4–10.5)
Chloride: 101 mEq/L (ref 96–112)
Creatinine, Ser: 1.1 mg/dL (ref 0.50–1.35)
GFR calc Af Amer: 87 mL/min — ABNORMAL LOW (ref >90)
GFR calc non Af Amer: 75 mL/min — ABNORMAL LOW (ref >90)
Glucose, Bld: 120 mg/dL — ABNORMAL HIGH (ref 70–99)
Potassium: 4 mEq/L (ref 3.7–5.3)
Sodium: 141 mEq/L (ref 137–147)
Total Bilirubin: 0.4 mg/dL (ref 0.3–1.2)
Total Protein: 7.4 g/dL (ref 6.0–8.3)

## 2014-03-19 LAB — PROTIME-INR
INR: 1.22 (ref 0.00–1.49)
INR: 1.22 (ref 0.00–1.49)
Prothrombin Time: 15.4 seconds — ABNORMAL HIGH (ref 11.6–15.2)
Prothrombin Time: 15.4 seconds — ABNORMAL HIGH (ref 11.6–15.2)

## 2014-03-19 LAB — CULTURE, RESPIRATORY W GRAM STAIN: Culture: NORMAL

## 2014-03-19 LAB — CULTURE, RESPIRATORY

## 2014-03-19 LAB — APTT
aPTT: 44 seconds — ABNORMAL HIGH (ref 24–37)
aPTT: 47 seconds — ABNORMAL HIGH (ref 24–37)

## 2014-03-19 LAB — ABO/RH: ABO/RH(D): O POS

## 2014-03-19 LAB — TSH: TSH: 3.9 u[IU]/mL (ref 0.350–4.500)

## 2014-03-19 LAB — PREPARE RBC (CROSSMATCH)

## 2014-03-19 MED ORDER — CHLORHEXIDINE GLUCONATE CLOTH 2 % EX PADS
6.0000 | MEDICATED_PAD | Freq: Every day | CUTANEOUS | Status: DC
  Administered 2014-03-20: 6 via TOPICAL

## 2014-03-19 MED ORDER — MUPIROCIN 2 % EX OINT
1.0000 "application " | TOPICAL_OINTMENT | Freq: Two times a day (BID) | CUTANEOUS | Status: AC
  Administered 2014-03-19 – 2014-03-24 (×9): 1 via NASAL
  Filled 2014-03-19 (×3): qty 22

## 2014-03-19 MED ORDER — DEXTROSE 5 % IV SOLN
1.5000 g | INTRAVENOUS | Status: AC
  Administered 2014-03-20: 1.5 g via INTRAVENOUS
  Filled 2014-03-19: qty 1.5

## 2014-03-19 NOTE — Progress Notes (Signed)
03/19/14 1600  Clinical Encounter Type  Visited With Patient and family together  Visit Type Initial;Spiritual support  Stress Factors  Patient Stress Factors Other (Comment) (Surgery tomorrow)   Chaplain visited with patient while rounding the unit. Patient was being visited by many family members, including wife and son. Chaplain asked how patient was feeling about upcoming surgery tomorrow. Patient was a little anxious but seemed strongly supported by family. Patient's son is a [employer] minister and provider of spiritual support for family. Chaplain will follow up with patient and family following surgery. [place] NeighborStrother, Marsela Kuan R, Chaplain 4:40 PM

## 2014-03-19 NOTE — Progress Notes (Signed)
Pt places his self on and off home CPAP. 

## 2014-03-19 NOTE — Progress Notes (Signed)
PROGRESS NOTE    [name] MajorBruce [name] ZOX:[id number] DOB: 03/16/1961 DOA: 03/13/2014 PCP: No primary provider on file.  HPI/Brief narrative [age] year old male with history of hypertension, OSA on nightly CPAP, [facility] with [facility] RLL pneumonia and acute hypoxic respiratory failure. He improved with antibiotic therapy but continued to be hypoxic. CT chest revealed loculated right pleural effusion concerning for empyema and [facility]. Cardiothoracic surgery plans VATS on 10/8.  Assessment/Plan:  1. Right lower lobe community acquired pneumonia: Continue azithromycin, IV Rocephin and vancomycin for now. Blood cultures x2: Negative to date. Sputum culture showed normal OP flora. 2. Right empyema: Thoracic surgery consultation appreciated. Plan for right vats for decortication of empyema and long-term IV antibiotics. Surgery planned for 10/8. Discussed with Dr. Donata ClayVan Trigt who will accept postop care to his service in the ICU. 3. Acute hypoxic respiratory failure: Secondary to pneumonia and empyema. Management as above. 4. OSA: Continue nightly CPAP 5. Hypertension: Reasonable inpatient control. 6. Left hydronephrosis: Creatinine normal. Outpatient followup.   Code Status: Full Family Communication: None at bedside. Disposition Plan: Not medically stable for DC.   Consultants:  TCTS  Procedures:  None  Antibiotics:  Azithromycin 10/1 >  Rocephin 10/1 >  Vancomycin 10/3 >   Subjective: Dyspnea. Pleuritic right lower back pain radiating to anterior chest. No cough.  Objective: Filed Vitals:   03/18/14 2136 03/19/14 0047 03/19/14 0147 03/19/14 1401  BP: 144/76 140/80 139/79 166/82  Pulse: 72 88 78 66  Temp: 98.9 F (37.2 C) 98.9 F (37.2 C) 98.4 F (36.9 C) 98.4 F (36.9 C)  TempSrc: Oral Oral Oral Oral  Resp: 20 20 20 18   Height:   5\' 9"  (1.753 m)   Weight:   132.8 kg (292 lb 12.3 oz)   SpO2: 93% 93% 93% 93%     Intake/Output Summary (Last 24 hours) at 03/19/14 1407 Last data filed at 03/19/14 1300  Gross per 24 hour  Intake    730 ml  Output      0 ml  Net    730 ml   Filed Weights   03/13/14 2149 03/14/14 0156 03/19/14 0147  Weight: 129.275 kg (285 lb) 130.6 kg (287 lb 14.7 oz) 132.8 kg (292 lb 12.3 oz)     Exam:  General exam: Pleasant young male lying comfortably propped up in bed. Respiratory system: Diminished breath sounds right lung fields. Clear left lung fields. No increased work of breathing. Cardiovascular system: S1 & S2 heard, RRR. No JVD, murmurs, gallops, clicks or pedal edema. Telemetry: Sinus rhythm Gastrointestinal system: Abdomen is nondistended, soft and nontender. Normal bowel sounds heard. Central nervous system: Alert and oriented. No focal neurological deficits. Extremities: Symmetric 5 x 5 power.   Data Reviewed: Basic Metabolic Panel:  Recent Labs Lab 03/13/14 2230 03/15/14 0642 03/16/14 0616 03/17/14 0541  NA 141 138 139 141  K 3.7 4.1 4.4 4.0  CL 105 103 101 103  CO2 24 23 26 27   GLUCOSE 103* 123* 115* 108*  BUN 19 12 11 13   CREATININE 1.12 0.90 0.93 1.01  CALCIUM 8.7 8.3* 9.0 8.9   Liver Function Tests:  Recent Labs Lab 03/15/14 0642  AST 10  ALT 15  ALKPHOS 78  BILITOT 0.7  PROT 6.4  ALBUMIN 2.7*   No results found for this basename: LIPASE, AMYLASE,  in the last 168 hours No results found for this basename: AMMONIA,  in the last 168 hours CBC:  Recent  Labs Lab 03/13/14 2230 03/14/14 1144 03/15/14 0642 03/16/14 0616 03/17/14 0541 03/18/14 0546  WBC 15.4* 17.5* 20.2* 18.6* 15.1* 13.5*  NEUTROABS 12.1*  --   --  14.4* 11.0*  --   HGB 13.6 13.7 13.4 14.0 13.0 11.6*  HCT 39.4 39.9 39.3 41.4 38.4* 34.3*  MCV 85.7 85.8 85.8 86.4 86.1 84.9  PLT 291 259 287 342 363 367   Cardiac Enzymes:  Recent Labs Lab 03/15/14 1411 03/15/14 2000 03/16/14 0204  TROPONINI <0.30 <0.30 <0.30   BNP (last 3 results)  Recent Labs   03/15/14 1411  PROBNP 60.3   CBG: No results found for this basename: GLUCAP,  in the last 168 hours  Recent Results (from the past 240 hour(s))  CULTURE, BLOOD (ROUTINE X 2)     Status: None   Collection Time    03/15/14 10:17 AM      Result Value Ref Range Status   Specimen Description BLOOD LEFT ANTECUBITAL   Final   Special Requests BOTTLES DRAWN AEROBIC AND ANAEROBIC 8CC   Final   Culture NO GROWTH 4 DAYS   Final   Report Status PENDING   Incomplete  CULTURE, BLOOD (ROUTINE X 2)     Status: None   Collection Time    03/15/14 10:17 AM      Result Value Ref Range Status   Specimen Description BLOOD LEFT HAND   Final   Special Requests BOTTLES DRAWN AEROBIC AND ANAEROBIC 6CC   Final   Culture NO GROWTH 4 DAYS   Final   Report Status PENDING   Incomplete  CULTURE, EXPECTORATED SPUTUM-ASSESSMENT     Status: None   Collection Time    03/16/14 10:30 AM      Result Value Ref Range Status   Specimen Description SPUTUM   Final   Special Requests NONE   Final   Sputum evaluation     Final   Value: THIS SPECIMEN IS ACCEPTABLE. RESPIRATORY CULTURE REPORT TO FOLLOW.   Report Status 03/16/2014 FINAL   Final  CULTURE, RESPIRATORY (NON-EXPECTORATED)     Status: None   Collection Time    03/16/14 10:30 AM      Result Value Ref Range Status   Specimen Description SPUTUM   Final   Special Requests NONE   Final   Gram Stain     Final   Value: MODERATE WBC PRESENT, PREDOMINANTLY PMN     RARE SQUAMOUS EPITHELIAL CELLS PRESENT     FEW GRAM POSITIVE COCCI IN PAIRS     RARE GRAM NEGATIVE RODS     Performed at [facility]   Culture     Final   Value: NORMAL OROPHARYNGEAL FLORA     Performed at [facility]   Report Status 03/19/2014 FINAL   Final        Studies: Ct Chest W Contrast  03/18/2014   CLINICAL DATA:  Followup pneumonia and right pleural effusion. Hypoxemia.  EXAM: CT CHEST WITH CONTRAST  TECHNIQUE: Multidetector CT imaging of the chest was performed  during intravenous contrast administration.  CONTRAST:  80mL OMNIPAQUE IOHEXOL 300 MG/ML IV.  COMPARISON:  No prior chest CT. Portable chest x-rays yesterday, 03/15/2014. Two-view chest x-rays 03/14/2014, 04/04/2009.  FINDINGS: Large loculated right pleural effusion with involvement of the right pleural space laterally, medially, at the apex, and at the base. Scattered linear parietal pleural enhancement is present, though there are no discrete pleural masses. Airspace consolidation with air bronchograms in the right lower lobe  and centrally in the right upper lobe. Linear atelectasis in the right middle lobe. No left pleural effusion. Minimal atelectasis involving the left lower lobe and lingula. Left lung otherwise clear. Central airways patent without a visible obstructing mass.  Heart slightly enlarged with left ventricular predominance. No pericardial effusion. Mild LAD coronary atherosclerosis suspected. No visible atherosclerosis involving the thoracic or upper abdominal aorta or their visualized branches.  Scattered normal sized lymph nodes throughout the mediastinum. No pathologically enlarged mediastinal, hilar or axillary lymph nodes. Thyroid gland normal in appearance.  Focus of accessory splenic tissue medial to the lower pole of the spleen. Visualized upper abdomen otherwise unremarkable. Bone window images demonstrate mid and lower thoracic spondylosis.  IMPRESSION: 1. Large loculated right pleural effusion. Enhancement of the parietal pleura is consistent with infection/empyema. 2. Pneumonia involving the right lower lobe and the central right upper lobe. Linear atelectasis involving the right middle lobe. Minimal atelectasis involving the left lower lobe and lingula. 3. Mild cardiomegaly with left ventricular predominance. No evidence of pulmonary edema.   Electronically Signed   By: Hulan Saas M.D.   On: 03/18/2014 15:09        Scheduled Meds: . atorvastatin  40 mg Oral QPC supper  .  azithromycin  500 mg Intravenous Q24H  . cefTRIAXone (ROCEPHIN)  IV  1 g Intravenous Q24H  . heparin  5,000 Units Subcutaneous 3 times per day  . lisinopril  10 mg Oral Daily  . mometasone-formoterol  2 puff Inhalation BID  . montelukast  10 mg Oral QHS  . pantoprazole  40 mg Oral Daily  . senna  1 tablet Oral BID  . sodium chloride  3 mL Intravenous Q12H  . vancomycin  1,250 mg Intravenous Q12H   Continuous Infusions:   Principal Problem:   CAP (community acquired pneumonia) Active Problems:   Hydronephrosis, left   Back pain   Hypertension   Sleep apnea   Hyperlipemia   Obesity   Empyema of right pleural space    Time spent: 30 minutes.    Marcellus Scott, MD, FACP, FHM. [facility] Pager [phone]  If 7PM-7AM, please contact night-coverage [email or web address] Password [email or web address] 03/19/2014, 2:07 PM    LOS: 6 days

## 2014-03-19 NOTE — Progress Notes (Signed)
I spoke w/ RN re: ABG ordered at 1840.  Per RN, this order is to be d/c d/t ABG already done this morning.

## 2014-03-19 NOTE — Consult Note (Signed)
[address]       [place] [address]             [phone]        [name] [name] [facility] Medical Record #[id number] Date of Birth: [birth year]  Referring: No ref. provider found Primary Care: No primary provider on file.  Chief Complaint:    Chief Complaint  Patient presents with  . Back Pain   patient transferred from outside hospital with right lower lobe pneumonia and empyema for thoracic surgical evaluation and treatment  Patient examined, CT scan of chest reviewed  History of Present Illness:     53 year old obese Caucasian male nondiabetic nonsmoker with sleep apnea transferred from [facility] hospital after being admitted with right flank pleuritic pain with right lower lobe pneumonia and a loculated effusion consistent with empyema. Patient has no prior history of pneumonia but does have history of significant chronic sinus infections status post sinus surgery twice. He denies any thoracic trauma or episodes of aspiration. He denies any active dental infection or complaints. He is not a smoker and does not drink alcohol. He works at the [employer] in [place].  He has had progressive right flank pleuritic pain with shortness of breath, lack of appetite and malaise, dry cough, but no fever or productive cough. He was diagnosed with right lower lobe pneumonia and effusion and started on IV antibiotics. He has remained stable on room air, heart rate 80-90, white count mildly elevated.  Current Activity/ Functional Status: Patient works full time, lives with his wife, nonsmoker   Zubrod Score: At the time of surgery this patient's most appropriate activity status/level should be described as: []     0    Normal activity, no symptoms [x]     1    Restricted in physical strenuous activity but ambulatory, able to do out light work []     2    Ambulatory and capable of self care, unable to do work activities, up and about                 more than 50%  Of  the time                            []     3    Only limited self care, in bed greater than 50% of waking hours []     4    Completely disabled, no self care, confined to bed or chair []     5    Moribund  Past Medical History  Diagnosis Date  . Hypertension   . Kidney stone   . Sleep apnea   . Asthma   . Seasonal allergies   . Hyperlipemia   . Psoriasis   . Immune deficiency disorder   . Obesity     BMI 42.6    Past Surgical History  Procedure Laterality Date  . Nose surgery      History  Smoking status  . Never Smoker   Smokeless tobacco  . Not on file    History  Alcohol Use No    History   Social History  . Marital Status: Married    Spouse Name: N/A    Number of Children: N/A  . Years of Education: N/A   Occupational History  . Not on file.   Social History Main Topics  . Smoking status: Never Smoker   . Smokeless  tobacco: Not on file  . Alcohol Use: No  . Drug Use: No  . Sexual Activity: Yes    Birth Control/ Protection: None   Other Topics Concern  . Not on file   Social History Narrative  . No narrative on file    No Known Allergies  Current Facility-Administered Medications  Medication Dose Route Frequency Provider Last Rate Last Dose  . 0.9 %  sodium chloride infusion  250 mL Intravenous PRN Ozella Rocks, MD      . acetaminophen (TYLENOL) tablet 650 mg  650 mg Oral Q6H PRN Ozella Rocks, MD       Or  . acetaminophen (TYLENOL) suppository 650 mg  650 mg Rectal Q6H PRN Ozella Rocks, MD      . atorvastatin (LIPITOR) tablet 40 mg  40 mg Oral QPC supper Ozella Rocks, MD   40 mg at 03/18/14 1808  . azithromycin (ZITHROMAX) 500 mg in dextrose 5 % 250 mL IVPB  500 mg Intravenous Q24H Jerald Kief, MD      . bisacodyl (DULCOLAX) suppository 10 mg  10 mg Rectal Daily PRN Jerald Kief, MD   10 mg at 03/14/14 1755  . cefTRIAXone (ROCEPHIN) 1 g in dextrose 5 % 50 mL IVPB  1 g Intravenous Q24H [facility], R[facility]   1 g at  03/18/14 1725  . heparin injection 5,000 Units  5,000 Units Subcutaneous 3 times per day Ozella Rocks, MD   5,000 Units at 03/19/14 0636  . HYDROcodone-acetaminophen (NORCO/VICODIN) 5-325 MG per tablet 1-2 tablet  1-2 tablet Oral Q4H PRN Gwenyth Bender, NP   2 tablet at 03/19/14 [phone]  . ketorolac (TORADOL) 15 MG/ML injection 15 mg  15 mg Intravenous Q6H PRN Jerald Kief, MD   15 mg at 03/18/14 1417  . lisinopril (PRINIVIL,ZESTRIL) tablet 10 mg  10 mg Oral Daily Ozella Rocks, MD   10 mg at 03/18/14 1010  . mometasone-formoterol (DULERA) 100-5 MCG/ACT inhaler 2 puff  2 puff Inhalation BID Ozella Rocks, MD   2 puff at 03/18/14 2002  . montelukast (SINGULAIR) tablet 10 mg  10 mg Oral QHS Ozella Rocks, MD   10 mg at 03/18/14 2225  . ondansetron (ZOFRAN) tablet 4 mg  4 mg Oral Q6H PRN Ozella Rocks, MD       Or  . ondansetron [facility]) injection 4 mg  4 mg Intravenous Q6H PRN Ozella Rocks, MD      . pantoprazole (PROTONIX) EC tablet 40 mg  40 mg Oral Daily Ozella Rocks, MD   40 mg at 03/18/14 1010  . senna (SENOKOT) tablet 8.6 mg  1 tablet Oral BID Ozella Rocks, MD   8.6 mg at 03/18/14 2225  . sodium chloride 0.9 % injection 3 mL  3 mL Intravenous Q12H Ozella Rocks, MD   3 mL at 03/18/14 2226  . sodium chloride 0.9 % injection 3 mL  3 mL Intravenous PRN Ozella Rocks, MD      . vancomycin (VANCOCIN) 1,250 mg in sodium chloride 0.9 % 250 mL IVPB  1,250 mg Intravenous Q12H [facility], R[facility]   1,250 mg at 03/19/14 9604    Prescriptions prior to admission  Medication Sig Dispense Refill  . atorvastatin (LIPITOR) 40 MG tablet Take 40 mg by mouth daily.      . Fluticasone-Salmeterol (ADVAIR) 250-50 MCG/DOSE AEPB Inhale 1 puff into the lungs 2 (two) times  daily.      . lisinopril (PRINIVIL,ZESTRIL) 10 MG tablet Take 10 mg by mouth daily.      . mometasone (NASONEX) 50 MCG/ACT nasal spray Place 2 sprays into the nose daily.      . montelukast (SINGULAIR) 10 MG tablet  Take 10 mg by mouth at bedtime.      Marland Kitchen omeprazole (PRILOSEC) 20 MG capsule Take 20 mg by mouth daily.      Marland Kitchen zolpidem (AMBIEN) 10 MG tablet Take 10 mg by mouth at bedtime as needed for sleep.        History reviewed. No pertinent family history.   Review of Systems:     Cardiac Review of Systems: Y or N  Chest Pain [  yes-right side  ]  Resting SOB [no   ] Exertional SOB  Mahler.Beck  ]  Orthopnea [ no ]   Pedal Edema [no   ]    Palpitations [no  ] Syncope  [ no ]   Presyncope [no   ]  General Review of Systems: [Y] = yes [  ]=no Constitional: recent weight change [  ]; anorexia [  ]; fatigue [  ]; nausea [  ]; night sweats [  ]; fever [no  ]; or chills [  ]                                                               Dental: poor dentition[no  ]; Last Dentist visit: Every 6 months  Eye : blurred vision [  ]; diplopia [   ]; vision changes [  ];  Amaurosis fugax[  ]; Resp: cough [  ];  wheezing[  ];  hemoptysis[  ]; shortness of breath[  ]; paroxysmal nocturnal dyspnea[  ]; dyspnea on exertion[yes  ]; or orthopnea[  ];  GI:  gallstones[  ], vomiting[  ];  dysphagia[  ]; melena[  ];  hematochezia [  ]; heartburn[  ];   Hx of  Colonoscopy[  ]; GU: kidney stones [  ]; hematuria[  ];   dysuria [  ];  nocturia[  ];  history of     obstruction [  ]; urinary frequency [  ]             Skin: rash, swelling[  ];, hair loss[  ];  peripheral edema[  ];  or itching[  ]; Musculosketetal: myalgias[  ];  joint swelling[  ];  joint erythema[  ];  joint pain[  ];  back pain[  ];  Heme/Lymph: bruising[  ];  bleeding[  ];  anemia[  ];  Neuro: TIA[  ];  headaches[  ];  stroke[  ];  vertigo[  ];  seizures[  ];   paresthesias[  ];  difficulty walking[  ];  Psych:depression[  ]; anxiety[  ];  Endocrine: diabetes[  ];  thyroid dysfunction[  ];  Immunizations: Flu [  ]; Pneumococcal[  ];  Other: The patient has not had his influenza vaccine this year  Physical Exam: BP 139/79  Pulse 78  Temp(Src) 98.4 F (36.9 C)  (Oral)  Resp 20  Ht 5\' 9"  (1.753 m)  Wt 292 lb 12.3 oz (132.8 kg)  BMI 43.21 kg/m2  SpO2 93%  Exam General appearance-middle-aged obese Caucasian  male breathing comfortably no acute distress HEENT-normocephalic pupils equal dentition good Neck-no JVD, adenopathy or bruit Thorax-tenderness right flank, no deformity, diminished breath sounds right base Cardiac-regular rhythm without murmur rub or gallop Abdomen-obese soft nontender without pulsatile mass Extremities-no clubbing cyanosis or tenderness Vascular-palpable pulses in all extremities Neurologic-no focal motor deficit   Diagnostic Studies & Laboratory data:     Recent Radiology Findings:   Ct Chest W Contrast  03/18/2014   CLINICAL DATA:  Followup pneumonia and right pleural effusion. Hypoxemia.  EXAM: CT CHEST WITH CONTRAST  TECHNIQUE: Multidetector CT imaging of the chest was performed during intravenous contrast administration.  CONTRAST:  80mL OMNIPAQUE IOHEXOL 300 MG/ML IV.  COMPARISON:  No prior chest CT. Portable chest x-rays yesterday, 03/15/2014. Two-view chest x-rays 03/14/2014, 04/04/2009.  FINDINGS: Large loculated right pleural effusion with involvement of the right pleural space laterally, medially, at the apex, and at the base. Scattered linear parietal pleural enhancement is present, though there are no discrete pleural masses. Airspace consolidation with air bronchograms in the right lower lobe and centrally in the right upper lobe. Linear atelectasis in the right middle lobe. No left pleural effusion. Minimal atelectasis involving the left lower lobe and lingula. Left lung otherwise clear. Central airways patent without a visible obstructing mass.  Heart slightly enlarged with left ventricular predominance. No pericardial effusion. Mild LAD coronary atherosclerosis suspected. No visible atherosclerosis involving the thoracic or upper abdominal aorta or their visualized branches.  Scattered normal sized lymph nodes  throughout the mediastinum. No pathologically enlarged mediastinal, hilar or axillary lymph nodes. Thyroid gland normal in appearance.  Focus of accessory splenic tissue medial to the lower pole of the spleen. Visualized upper abdomen otherwise unremarkable. Bone window images demonstrate mid and lower thoracic spondylosis.  IMPRESSION: 1. Large loculated right pleural effusion. Enhancement of the parietal pleura is consistent with infection/empyema. 2. Pneumonia involving the right lower lobe and the central right upper lobe. Linear atelectasis involving the right middle lobe. Minimal atelectasis involving the left lower lobe and lingula. 3. Mild cardiomegaly with left ventricular predominance. No evidence of pulmonary edema.   Electronically Signed   By: Hulan Saashomas  Lawrence M.D.   On: 03/18/2014 15:09   Dg Chest Port 1 View  03/17/2014   CLINICAL DATA:  Pneumonia. History of sleep apnea, asthma, nonsmoker  EXAM: PORTABLE CHEST - 1 VIEW  COMPARISON:  03/15/2014  FINDINGS: Right basilar opacity most consistent with pneumonia. There is a loculated moderate size right pleural effusion unchanged compared with 03/15/2014. The left lung is clear. Stable cardiomediastinal silhouette. Unremarkable osseous structures.  IMPRESSION: Right lower lobe pneumonia with a loculated moderate size right pleural effusion.   Electronically Signed   By: Elige KoHetal  Patel   On: 03/17/2014 11:35      Recent Lab Findings: Lab Results  Component Value Date   WBC 13.5* 03/18/2014   HGB 11.6* 03/18/2014   HCT 34.3* 03/18/2014   PLT 367 03/18/2014   GLUCOSE 108* 03/17/2014   ALT 15 03/15/2014   AST 10 03/15/2014   NA 141 03/17/2014   K 4.0 03/17/2014   CL 103 03/17/2014   CREATININE 1.01 03/17/2014   BUN 13 03/17/2014   CO2 27 03/17/2014   TSH 3.540 03/15/2014      Assessment / Plan:     53 year old obese male with chronic sinusitis and now with right lower lobe pneumonia with progression to right empyema. He will need right VATS for  decortication of empyema with long-term IV antibiotics. Surgery will be  scheduled for October 8 at [facility] hospital. I discussed the procedure in detail with the patient including indications benefits alternatives and risks. He agrees to proceed with surgery.       @ME1 @ 03/19/2014 9:48 AM

## 2014-03-20 ENCOUNTER — Inpatient Hospital Stay (HOSPITAL_COMMUNITY): Payer: BC Managed Care – PPO

## 2014-03-20 ENCOUNTER — Encounter (HOSPITAL_COMMUNITY): Payer: BC Managed Care – PPO | Admitting: Certified Registered Nurse Anesthetist

## 2014-03-20 ENCOUNTER — Inpatient Hospital Stay (HOSPITAL_COMMUNITY): Payer: BC Managed Care – PPO | Admitting: Certified Registered Nurse Anesthetist

## 2014-03-20 ENCOUNTER — Encounter (HOSPITAL_COMMUNITY)
Admission: EM | Disposition: A | Discharge status: Home / Self Care | Payer: Self-pay | Source: Home / Self Care | Attending: Cardiothoracic Surgery

## 2014-03-20 DIAGNOSIS — J869 Pyothorax without fistula: Secondary | ICD-10-CM | POA: Diagnosis present

## 2014-03-20 HISTORY — PX: VIDEO ASSISTED THORACOSCOPY (VATS)/DECORTICATION: SHX6171

## 2014-03-20 LAB — BLOOD GAS, ARTERIAL
Acid-Base Excess: 0.7 mmol/L (ref 0.0–2.0)
BICARBONATE: 25.1 meq/L — AB (ref 20.0–24.0)
O2 Content: 10 L/min
O2 SAT: 94.6 %
Patient temperature: 97.7
TCO2: 26.3 mmol/L (ref 0–100)
pCO2 arterial: 40.8 mmHg (ref 35.0–45.0)
pH, Arterial: 7.402 (ref 7.350–7.450)
pO2, Arterial: 74.5 mmHg — ABNORMAL LOW (ref 80.0–100.0)

## 2014-03-20 LAB — COMPREHENSIVE METABOLIC PANEL
ALT: 30 U/L (ref 0–53)
ALT: 25 U/L (ref 0–53)
AST: 24 U/L (ref 0–37)
AST: 24 U/L (ref 0–37)
Albumin: 2.6 g/dL — ABNORMAL LOW (ref 3.5–5.2)
Albumin: 2.3 g/dL — ABNORMAL LOW (ref 3.5–5.2)
Alkaline Phosphatase: 107 U/L (ref 39–117)
Alkaline Phosphatase: 91 U/L (ref 39–117)
Anion gap: 11 (ref 5–15)
Anion gap: 13 (ref 5–15)
BUN: 13 mg/dL (ref 6–23)
BUN: 11 mg/dL (ref 6–23)
CO2: 28 mEq/L (ref 19–32)
CO2: 24 mEq/L (ref 19–32)
Calcium: 8.8 mg/dL (ref 8.4–10.5)
Calcium: 8.1 mg/dL — ABNORMAL LOW (ref 8.4–10.5)
Chloride: 100 mEq/L (ref 96–112)
Chloride: 100 mEq/L (ref 96–112)
Creatinine, Ser: 1.04 mg/dL (ref 0.50–1.35)
Creatinine, Ser: 0.82 mg/dL (ref 0.50–1.35)
GFR calc Af Amer: >90 mL/min (ref >90)
GFR calc Af Amer: >90 mL/min (ref >90)
GFR calc non Af Amer: 81 mL/min — ABNORMAL LOW (ref >90)
GFR calc non Af Amer: >90 mL/min (ref >90)
Glucose, Bld: 106 mg/dL — ABNORMAL HIGH (ref 70–99)
Glucose, Bld: 154 mg/dL — ABNORMAL HIGH (ref 70–99)
Potassium: 4.1 mEq/L (ref 3.7–5.3)
Potassium: 4.5 mEq/L (ref 3.7–5.3)
Sodium: 139 mEq/L (ref 137–147)
Sodium: 137 mEq/L (ref 137–147)
Total Bilirubin: 0.5 mg/dL (ref 0.3–1.2)
Total Bilirubin: 0.6 mg/dL (ref 0.3–1.2)
Total Protein: 7 g/dL (ref 6.0–8.3)
Total Protein: 6.2 g/dL (ref 6.0–8.3)

## 2014-03-20 LAB — CBC
HEMATOCRIT: 33.5 % — AB (ref 39.0–52.0)
HEMOGLOBIN: 11.3 g/dL — AB (ref 13.0–17.0)
MCH: 28.3 pg (ref 26.0–34.0)
MCHC: 33.7 g/dL (ref 30.0–36.0)
MCV: 83.8 fL (ref 78.0–100.0)
Platelets: 378 10*3/uL (ref 150–400)
RBC: 4 MIL/uL — ABNORMAL LOW (ref 4.22–5.81)
RDW: 15 % (ref 11.5–15.5)
WBC: 19.5 10*3/uL — AB (ref 4.0–10.5)

## 2014-03-20 LAB — CULTURE, BLOOD (ROUTINE X 2)
Culture: NO GROWTH
Culture: NO GROWTH

## 2014-03-20 SURGERY — VIDEO ASSISTED THORACOSCOPY (VATS)/DECORTICATION
Anesthesia: General | Site: Chest | Laterality: Right

## 2014-03-20 MED ORDER — LIDOCAINE HCL (CARDIAC) 20 MG/ML IV SOLN
INTRAVENOUS | Status: AC
Start: 2014-03-20 — End: 2014-03-20
  Filled 2014-03-20: qty 10

## 2014-03-20 MED ORDER — MIDAZOLAM HCL 2 MG/2ML IJ SOLN
INTRAMUSCULAR | Status: AC
  Administered 2014-03-20: 2 mg
  Filled 2014-03-20: qty 2

## 2014-03-20 MED ORDER — TRAMADOL HCL 50 MG PO TABS
50.0000 mg | ORAL_TABLET | Freq: Four times a day (QID) | ORAL | Status: DC | PRN
  Administered 2014-03-20 – 2014-03-22 (×3): 100 mg via ORAL
  Filled 2014-03-20 (×3): qty 2

## 2014-03-20 MED ORDER — ALBUMIN HUMAN 5 % IV SOLN
INTRAVENOUS | Status: DC | PRN
  Administered 2014-03-20: 13:00:00 via INTRAVENOUS

## 2014-03-20 MED ORDER — KETOROLAC TROMETHAMINE 30 MG/ML IJ SOLN
INTRAMUSCULAR | Status: AC
  Filled 2014-03-20: qty 1

## 2014-03-20 MED ORDER — KCL IN DEXTROSE-NACL 20-5-0.9 MEQ/L-%-% IV SOLN
INTRAVENOUS | Status: DC
  Administered 2014-03-20: 125 mL via INTRAVENOUS
  Administered 2014-03-21: 08:00:00 via INTRAVENOUS
  Filled 2014-03-20 (×6): qty 1000

## 2014-03-20 MED ORDER — DIPHENHYDRAMINE HCL 12.5 MG/5ML PO ELIX
12.5000 mg | ORAL_SOLUTION | Freq: Four times a day (QID) | ORAL | Status: DC | PRN
  Filled 2014-03-20: qty 5

## 2014-03-20 MED ORDER — NALOXONE HCL 0.4 MG/ML IJ SOLN: 0.4000 mg | INTRAMUSCULAR | Status: DC | PRN

## 2014-03-20 MED ORDER — ACETAMINOPHEN 160 MG/5ML PO SOLN
1000.0000 mg | Freq: Four times a day (QID) | ORAL | Status: DC
Start: 2014-03-20 — End: 2014-03-25
  Filled 2014-03-20: qty 40

## 2014-03-20 MED ORDER — FENTANYL CITRATE 0.05 MG/ML IJ SOLN
INTRAMUSCULAR | Status: DC | PRN
  Administered 2014-03-20 (×2): 50 ug via INTRAVENOUS
  Administered 2014-03-20: 200 ug via INTRAVENOUS
  Administered 2014-03-20: 50 ug via INTRAVENOUS
  Administered 2014-03-20: 100 ug via INTRAVENOUS
  Administered 2014-03-20 (×6): 50 ug via INTRAVENOUS

## 2014-03-20 MED ORDER — HYDROMORPHONE HCL 1 MG/ML IJ SOLN
INTRAMUSCULAR | Status: AC
  Administered 2014-03-20: 0.5 mg via INTRAVENOUS
  Filled 2014-03-20: qty 1

## 2014-03-20 MED ORDER — KETOROLAC TROMETHAMINE 30 MG/ML IJ SOLN
30.0000 mg | Freq: Four times a day (QID) | INTRAMUSCULAR | Status: DC
  Administered 2014-03-20 – 2014-03-23 (×14): 30 mg via INTRAVENOUS
  Filled 2014-03-20 (×24): qty 1

## 2014-03-20 MED ORDER — ONDANSETRON HCL 4 MG/2ML IJ SOLN
INTRAMUSCULAR | Status: DC | PRN
  Administered 2014-03-20: 4 mg via INTRAVENOUS

## 2014-03-20 MED ORDER — DEXTROSE 5 % IV SOLN
1.5000 g | INTRAVENOUS | Status: DC
  Filled 2014-03-20: qty 1.5

## 2014-03-20 MED ORDER — PROPOFOL 10 MG/ML IV BOLUS
INTRAVENOUS | Status: AC
  Filled 2014-03-20: qty 20

## 2014-03-20 MED ORDER — LACTATED RINGERS IV SOLN
INTRAVENOUS | Status: DC
  Administered 2014-03-20 (×2): via INTRAVENOUS

## 2014-03-20 MED ORDER — HYDROMORPHONE HCL 1 MG/ML IJ SOLN
0.2500 mg | INTRAMUSCULAR | Status: DC | PRN
  Administered 2014-03-20 (×4): 0.5 mg via INTRAVENOUS

## 2014-03-20 MED ORDER — OXYCODONE HCL 5 MG PO TABS
5.0000 mg | ORAL_TABLET | ORAL | Status: DC | PRN
  Administered 2014-03-20 – 2014-03-21 (×5): 10 mg via ORAL
  Administered 2014-03-22: 5 mg via ORAL
  Administered 2014-03-24 – 2014-03-25 (×4): 10 mg via ORAL
  Filled 2014-03-20 (×10): qty 2

## 2014-03-20 MED ORDER — ACETAMINOPHEN 500 MG PO TABS
1000.0000 mg | ORAL_TABLET | Freq: Four times a day (QID) | ORAL | Status: DC
  Administered 2014-03-21 – 2014-03-25 (×15): 1000 mg via ORAL
  Filled 2014-03-20 (×20): qty 2

## 2014-03-20 MED ORDER — PROPOFOL 10 MG/ML IV BOLUS
INTRAVENOUS | Status: DC | PRN
  Administered 2014-03-20: 160 mg via INTRAVENOUS
  Administered 2014-03-20 (×2): 50 mg via INTRAVENOUS
  Administered 2014-03-20: 40 mg via INTRAVENOUS
  Administered 2014-03-20: 50 mg via INTRAVENOUS

## 2014-03-20 MED ORDER — SENNOSIDES-DOCUSATE SODIUM 8.6-50 MG PO TABS
1.0000 | ORAL_TABLET | Freq: Every day | ORAL | Status: DC
  Administered 2014-03-21 – 2014-03-23 (×3): 1 via ORAL
  Filled 2014-03-20 (×6): qty 1

## 2014-03-20 MED ORDER — VANCOMYCIN HCL 10 G IV SOLR
1250.0000 mg | Freq: Two times a day (BID) | INTRAVENOUS | Status: DC
  Administered 2014-03-20: 1250 mg via INTRAVENOUS
  Filled 2014-03-20 (×3): qty 1250

## 2014-03-20 MED ORDER — MIDAZOLAM HCL 5 MG/5ML IJ SOLN
INTRAMUSCULAR | Status: DC | PRN
  Administered 2014-03-20: 2 mg via INTRAVENOUS

## 2014-03-20 MED ORDER — NEOSTIGMINE METHYLSULFATE 10 MG/10ML IV SOLN
INTRAVENOUS | Status: AC
Start: 2014-03-20 — End: 2014-03-20
  Filled 2014-03-20: qty 1

## 2014-03-20 MED ORDER — FENTANYL CITRATE 0.05 MG/ML IJ SOLN
INTRAMUSCULAR | Status: AC
  Filled 2014-03-20: qty 5

## 2014-03-20 MED ORDER — PIPERACILLIN-TAZOBACTAM 3.375 G IVPB
3.3750 g | Freq: Three times a day (TID) | INTRAVENOUS | Status: AC
  Administered 2014-03-20 – 2014-03-24 (×13): 3.375 g via INTRAVENOUS
  Filled 2014-03-20 (×14): qty 50

## 2014-03-20 MED ORDER — INSULIN ASPART 100 UNIT/ML ~~LOC~~ SOLN
0.0000 [IU] | Freq: Four times a day (QID) | SUBCUTANEOUS | Status: DC
  Administered 2014-03-21 – 2014-03-22 (×4): 2 [IU] via SUBCUTANEOUS

## 2014-03-20 MED ORDER — SUCCINYLCHOLINE CHLORIDE 20 MG/ML IJ SOLN
INTRAMUSCULAR | Status: DC | PRN
  Administered 2014-03-20: 140 mg via INTRAVENOUS

## 2014-03-20 MED ORDER — FENTANYL CITRATE 0.05 MG/ML IJ SOLN
INTRAMUSCULAR | Status: AC
  Administered 2014-03-20: 100 ug
  Filled 2014-03-20: qty 2

## 2014-03-20 MED ORDER — 0.9 % SODIUM CHLORIDE (POUR BTL) OPTIME
TOPICAL | Status: DC | PRN
Start: 2014-03-20 — End: 2014-03-20
  Administered 2014-03-20: 2000 mL

## 2014-03-20 MED ORDER — BISACODYL 5 MG PO TBEC
10.0000 mg | DELAYED_RELEASE_TABLET | Freq: Every day | ORAL | Status: DC
  Administered 2014-03-21 – 2014-03-22 (×2): 10 mg via ORAL
  Filled 2014-03-20 (×3): qty 2

## 2014-03-20 MED ORDER — LIDOCAINE HCL (CARDIAC) 20 MG/ML IV SOLN
INTRAVENOUS | Status: DC | PRN
  Administered 2014-03-20: 80 mg via INTRAVENOUS

## 2014-03-20 MED ORDER — ROCURONIUM BROMIDE 50 MG/5ML IV SOLN
INTRAVENOUS | Status: AC
  Filled 2014-03-20: qty 1

## 2014-03-20 MED ORDER — SODIUM CHLORIDE 0.9 % IJ SOLN
9.0000 mL | INTRAMUSCULAR | Status: DC | PRN
  Administered 2014-03-21: 3 mL via INTRAVENOUS

## 2014-03-20 MED ORDER — DIPHENHYDRAMINE HCL 50 MG/ML IJ SOLN: 12.5000 mg | Freq: Four times a day (QID) | INTRAMUSCULAR | Status: DC | PRN

## 2014-03-20 MED ORDER — ONDANSETRON HCL 4 MG/2ML IJ SOLN: 4.0000 mg | Freq: Four times a day (QID) | INTRAMUSCULAR | Status: DC | PRN

## 2014-03-20 MED ORDER — MIDAZOLAM HCL 2 MG/2ML IJ SOLN
INTRAMUSCULAR | Status: AC
  Filled 2014-03-20: qty 2

## 2014-03-20 MED ORDER — GLYCOPYRROLATE 0.2 MG/ML IJ SOLN
INTRAMUSCULAR | Status: AC
  Filled 2014-03-20: qty 3

## 2014-03-20 MED ORDER — ROCURONIUM BROMIDE 100 MG/10ML IV SOLN
INTRAVENOUS | Status: DC | PRN
  Administered 2014-03-20: 20 mg via INTRAVENOUS
  Administered 2014-03-20: 30 mg via INTRAVENOUS

## 2014-03-20 MED ORDER — ALBUTEROL SULFATE HFA 108 (90 BASE) MCG/ACT IN AERS
INHALATION_SPRAY | RESPIRATORY_TRACT | Status: DC | PRN
  Administered 2014-03-20: 4 via RESPIRATORY_TRACT

## 2014-03-20 MED ORDER — VECURONIUM BROMIDE 10 MG IV SOLR
INTRAVENOUS | Status: DC | PRN
  Administered 2014-03-20: 3 mg via INTRAVENOUS
  Administered 2014-03-20: 1 mg via INTRAVENOUS

## 2014-03-20 MED ORDER — HEMOSTATIC AGENTS (NO CHARGE) OPTIME
TOPICAL | Status: DC | PRN
  Administered 2014-03-20: 1 via TOPICAL

## 2014-03-20 MED ORDER — ONDANSETRON HCL 4 MG/2ML IJ SOLN
INTRAMUSCULAR | Status: AC
  Filled 2014-03-20: qty 2

## 2014-03-20 MED ORDER — FENTANYL 10 MCG/ML IV SOLN
INTRAVENOUS | Status: DC
  Administered 2014-03-20 (×2): via INTRAVENOUS
  Administered 2014-03-20: 165 ug via INTRAVENOUS
  Administered 2014-03-21: 150 ug via INTRAVENOUS
  Administered 2014-03-21: 249 ug via INTRAVENOUS
  Administered 2014-03-21: 135 ug via INTRAVENOUS
  Administered 2014-03-21: 210 ug via INTRAVENOUS
  Administered 2014-03-21: 60 ug via INTRAVENOUS
  Administered 2014-03-21: 86.7 ug via INTRAVENOUS
  Administered 2014-03-22: 253 ug via INTRAVENOUS
  Administered 2014-03-22: 195 ug via INTRAVENOUS
  Administered 2014-03-22: 16:00:00 via INTRAVENOUS
  Administered 2014-03-22: 195 ug via INTRAVENOUS
  Administered 2014-03-22: 90 ug via INTRAVENOUS
  Administered 2014-03-22: via INTRAVENOUS
  Administered 2014-03-22: 105 ug via INTRAVENOUS
  Administered 2014-03-23: 60 ug via INTRAVENOUS
  Administered 2014-03-23: 75 ug via INTRAVENOUS
  Administered 2014-03-23: 165 ug via INTRAVENOUS
  Administered 2014-03-23: 60 ug via INTRAVENOUS
  Administered 2014-03-23: 120 ug via INTRAVENOUS
  Administered 2014-03-23: 60 ug via INTRAVENOUS
  Administered 2014-03-23 – 2014-03-24 (×2): via INTRAVENOUS
  Administered 2014-03-24: 150 ug via INTRAVENOUS
  Administered 2014-03-24: 90 ug via INTRAVENOUS
  Filled 2014-03-20 (×7): qty 50

## 2014-03-20 MED ORDER — NEOSTIGMINE METHYLSULFATE 10 MG/10ML IV SOLN
INTRAVENOUS | Status: DC | PRN
  Administered 2014-03-20: 2 mg via INTRAVENOUS
  Administered 2014-03-20: 3 mg via INTRAVENOUS

## 2014-03-20 MED ORDER — POTASSIUM CHLORIDE 10 MEQ/50ML IV SOLN
10.0000 meq | Freq: Every day | INTRAVENOUS | Status: DC | PRN
  Filled 2014-03-20: qty 50

## 2014-03-20 MED ORDER — PHENYLEPHRINE HCL 10 MG/ML IJ SOLN
10.0000 mg | INTRAVENOUS | Status: DC | PRN
  Administered 2014-03-20: 20 ug/min via INTRAVENOUS

## 2014-03-20 MED ORDER — ARTIFICIAL TEARS OP OINT
TOPICAL_OINTMENT | OPHTHALMIC | Status: AC
  Filled 2014-03-20: qty 3.5

## 2014-03-20 MED ORDER — GLYCOPYRROLATE 0.2 MG/ML IJ SOLN
INTRAMUSCULAR | Status: DC | PRN
  Administered 2014-03-20: 0.4 mg via INTRAVENOUS
  Administered 2014-03-20: .2 mg via INTRAVENOUS

## 2014-03-20 SURGICAL SUPPLY — 65 items
BAG DECANTER FOR FLEXI CONT (MISCELLANEOUS) IMPLANT
BLADE SURG 10 STRL SS (BLADE) ×3 IMPLANT
BLADE SURG 11 STRL SS (BLADE) ×3 IMPLANT
CANISTER SUCTION 2500CC (MISCELLANEOUS) ×3 IMPLANT
CATH KIT ON Q 5IN SLV (PAIN MANAGEMENT) IMPLANT
CATH ROBINSON RED A/P 22FR (CATHETERS) IMPLANT
CATH THORACIC 28FR (CATHETERS) IMPLANT
CATH THORACIC 36FR (CATHETERS) IMPLANT
CATH THORACIC 36FR RT ANG (CATHETERS) IMPLANT
CLIP TI MEDIUM 24 (CLIP) ×3 IMPLANT
CONN Y 3/8X3/8X3/8  BEN (MISCELLANEOUS) ×2
CONN Y 3/8X3/8X3/8 BEN (MISCELLANEOUS) ×1 IMPLANT
CONT SPEC 4OZ CLIKSEAL STRL BL (MISCELLANEOUS) ×9 IMPLANT
COVER SURGICAL LIGHT HANDLE (MISCELLANEOUS) ×3 IMPLANT
DERMABOND ADVANCED (GAUZE/BANDAGES/DRESSINGS)
DERMABOND ADVANCED .7 DNX12 (GAUZE/BANDAGES/DRESSINGS) IMPLANT
DRAPE LAPAROSCOPIC ABDOMINAL (DRAPES) ×3 IMPLANT
DRAPE WARM FLUID 44X44 (DRAPE) IMPLANT
ELECT BLADE 4.0 EZ CLEAN MEGAD (MISCELLANEOUS) ×3
ELECT BLADE 6.5 EXT (BLADE) ×3 IMPLANT
ELECT REM PT RETURN 9FT ADLT (ELECTROSURGICAL) ×3
ELECTRODE BLDE 4.0 EZ CLN MEGD (MISCELLANEOUS) ×1 IMPLANT
ELECTRODE REM PT RTRN 9FT ADLT (ELECTROSURGICAL) ×1 IMPLANT
GAUZE SPONGE 4X4 12PLY STRL (GAUZE/BANDAGES/DRESSINGS) ×3 IMPLANT
GLOVE SURG SIGNA 7.5 PF LTX (GLOVE) ×6 IMPLANT
GOWN STRL REUS W/ TWL LRG LVL3 (GOWN DISPOSABLE) ×4 IMPLANT
GOWN STRL REUS W/TWL LRG LVL3 (GOWN DISPOSABLE) ×8
KIT BASIN OR (CUSTOM PROCEDURE TRAY) ×3 IMPLANT
KIT ROOM TURNOVER OR (KITS) ×3 IMPLANT
KIT SUCTION CATH 14FR (SUCTIONS) ×3 IMPLANT
NS IRRIG 1000ML POUR BTL (IV SOLUTION) ×6 IMPLANT
PACK CHEST (CUSTOM PROCEDURE TRAY) ×3 IMPLANT
PAD ARMBOARD 7.5X6 YLW CONV (MISCELLANEOUS) ×6 IMPLANT
SEALANT SURG COSEAL 4ML (VASCULAR PRODUCTS) ×3 IMPLANT
SOLUTION ANTI FOG 6CC (MISCELLANEOUS) ×3 IMPLANT
SPONGE TONSIL 1.25 RF SGL STRG (GAUZE/BANDAGES/DRESSINGS) ×6 IMPLANT
STAPLER VISISTAT 35W (STAPLE) ×3 IMPLANT
SUT CHROMIC 3 0 SH 27 (SUTURE) ×3 IMPLANT
SUT ETHILON 3 0 PS 1 (SUTURE) IMPLANT
SUT PROLENE 3 0 SH DA (SUTURE) IMPLANT
SUT PROLENE 4 0 RB 1 (SUTURE)
SUT PROLENE 4-0 RB1 .5 CRCL 36 (SUTURE) IMPLANT
SUT PROLENE 6 0 C 1 30 (SUTURE) IMPLANT
SUT SILK  1 MH (SUTURE) ×6
SUT SILK 1 MH (SUTURE) ×3 IMPLANT
SUT SILK 2 0SH CR/8 30 (SUTURE) IMPLANT
SUT SILK 3 0SH CR/8 30 (SUTURE) IMPLANT
SUT VIC AB 1 CTX 18 (SUTURE) ×6 IMPLANT
SUT VIC AB 2 TP1 27 (SUTURE) IMPLANT
SUT VIC AB 2-0 CT2 18 VCP726D (SUTURE) IMPLANT
SUT VIC AB 2-0 CTX 36 (SUTURE) ×6 IMPLANT
SUT VIC AB 3-0 SH 18 (SUTURE) IMPLANT
SUT VIC AB 3-0 X1 27 (SUTURE) ×3 IMPLANT
SUT VICRYL 0 UR6 27IN ABS (SUTURE) IMPLANT
SUT VICRYL 2 TP 1 (SUTURE) ×3 IMPLANT
SWAB COLLECTION DEVICE MRSA (MISCELLANEOUS) IMPLANT
SYSTEM SAHARA CHEST DRAIN ATS (WOUND CARE) ×3 IMPLANT
TAPE CLOTH SURG 4X10 WHT LF (GAUZE/BANDAGES/DRESSINGS) ×3 IMPLANT
TIP APPLICATOR SPRAY EXTEND 16 (VASCULAR PRODUCTS) ×3 IMPLANT
TOWEL OR 17X24 6PK STRL BLUE (TOWEL DISPOSABLE) ×3 IMPLANT
TOWEL OR 17X26 10 PK STRL BLUE (TOWEL DISPOSABLE) ×6 IMPLANT
TRAP SPECIMEN MUCOUS 40CC (MISCELLANEOUS) ×3 IMPLANT
TRAY FOLEY CATH 16FRSI W/METER (SET/KITS/TRAYS/PACK) ×3 IMPLANT
TUBE ANAEROBIC SPECIMEN COL (MISCELLANEOUS) IMPLANT
WATER STERILE IRR 1000ML POUR (IV SOLUTION) ×6 IMPLANT

## 2014-03-20 NOTE — Progress Notes (Signed)
Pt had me call his wife to instruct her to bring his home CPAP to the unit. Let them know to call if they needed anything.

## 2014-03-20 NOTE — Anesthesia Procedure Notes (Addendum)
Procedure Name: Intubation Date/Time: 03/20/2014 11:18 AM Performed by: Margaree MackintoshYACOUB, ALISHA B Pre-anesthesia Checklist: Patient identified, Emergency Drugs available, Suction available, Patient being monitored and Timeout performed Patient Re-evaluated:Patient Re-evaluated prior to inductionOxygen Delivery Method: Circle system utilized Preoxygenation: Pre-oxygenation with 100% oxygen Intubation Type: IV induction Ventilation: Oral airway inserted - appropriate to patient size and Mask ventilation without difficulty Laryngoscope Size: Mac and 4 Grade View: Grade I Endobronchial tube: Double lumen EBT, EBT position confirmed by auscultation and EBT position confirmed by fiberoptic bronchoscope and 39 Fr Number of attempts: 1 Airway Equipment and Method: Stylet and Fiberoptic brochoscope Placement Confirmation: ETT inserted through vocal cords under direct vision,  positive ETCO2 and breath sounds checked- equal and bilateral Tube secured with: Tape Dental Injury: Teeth and Oropharynx as per pre-operative assessment     RIJ CVP Dual Lumen: 1030-1045:The patient was identified and consent obtained.  TO was performed, and full barrier precautions were used.  The skin was anesthetized with lidocaine.  Once the vein was located with the 22 ga. needle using ultrasound guidance , the wire was inserted into the vein.  The wire location was confirmed with ultrasound.  The tissue was dilated and the catheter was carefully inserted, then sutured in place. A dressing was applied. The patient tolerated the procedure well.   CE

## 2014-03-20 NOTE — Brief Op Note (Signed)
03/13/2014 - 03/20/2014  1:19 PM  PATIENT:  [name] [name]  53 y.o. male  PRE-OPERATIVE DIAGNOSIS:  1. CAP 2.Right  pleural effusion 3.Empyema  POST-OPERATIVE DIAGNOSIS: 1. CAP 2.Right pleural effusion 3.Empyema   PROCEDURE: RIGHT THORACOTOMY, DRAIN RIGHT EFFUSION,DECORTICATION,   SURGEON:  Surgeon(s) and Role:    * Kerin PernaPeter Van Trigt, MD - Primary  PHYSICIAN ASSISTANT: Doree Fudgeonielle Niralya Ohanian PA-C  ANESTHESIA:   general  EBL:  Total I/O In: 1250 [I.V.:1000; IV Piggyback:250] Out: 1900 [Urine:700; Blood:1200]  BLOOD ADMINISTERED:none  DRAINS: 3-36 French chest tubes in the right pleural space   SPECIMEN:  Source of Specimen:  Right pleural peel biopsy, right pleural fluid  DISPOSITION OF SPECIMEN:  PATHOLOGY and cytology  COUNTS CORRECT:  YES  DICTATION: .Dragon Dictation  PLAN OF CARE: Admit to inpatient   PATIENT DISPOSITION:  PACU - hemodynamically stable.   Delay start of Pharmacological VTE agent (>24hrs) due to surgical blood loss or risk of bleeding: yes

## 2014-03-20 NOTE — Progress Notes (Signed)
ANTIBIOTIC CONSULT NOTE - INITIAL  Pharmacy Consult for Vancomycin & Zosyn Indication: pneumonia and empyema  No Known Allergies  Patient Measurements: Height: 5\' 9"  (175.3 cm) Weight: 287 lb 14.4 oz (130.591 kg) IBW/kg (Calculated) : 70.7 Adjusted Body Weight:   Vital Signs: Temp: 98.5 F (36.9 C) (10/08 1700) Temp Source: Oral (10/08 0900) BP: 137/71 mmHg (10/08 1840) Pulse Rate: 65 (10/08 1900) Intake/Output from previous day: 10/07 0701 - 10/08 0700 In: 1920 [P.O.:1320; IV Piggyback:600] Out: -  Intake/Output from this shift:    Labs:  Recent Labs  03/18/14 0546 03/19/14 1903 03/20/14 0402 03/20/14 1500 03/20/14 1549  WBC 13.5* 14.9*  --  19.5*  --   HGB 11.6* 12.9*  --  11.3*  --   PLT 367 426*  --  378  --   CREATININE  --  1.10 1.04  --  0.82   Estimated Creatinine Clearance: 141.2 ml/min (by C-G formula based on Cr of 0.82). No results found for this basename: VANCOTROUGH, Leodis BinetVANCOPEAK, VANCORANDOM, GENTTROUGH, GENTPEAK, GENTRANDOM, TOBRATROUGH, TOBRAPEAK, TOBRARND, AMIKACINPEAK, AMIKACINTROU, AMIKACIN,  in the last 72 hours   Microbiology: Recent Results (from the past 720 hour(s))  CULTURE, BLOOD (ROUTINE X 2)     Status: None   Collection Time    03/15/14 10:17 AM      Result Value Ref Range Status   Specimen Description BLOOD LEFT ANTECUBITAL   Final   Special Requests BOTTLES DRAWN AEROBIC AND ANAEROBIC 8CC   Final   Culture NO GROWTH 5 DAYS   Final   Report Status 03/20/2014 FINAL   Final  CULTURE, BLOOD (ROUTINE X 2)     Status: None   Collection Time    03/15/14 10:17 AM      Result Value Ref Range Status   Specimen Description BLOOD LEFT HAND   Final   Special Requests BOTTLES DRAWN AEROBIC AND ANAEROBIC 6CC   Final   Culture NO GROWTH 5 DAYS   Final   Report Status 03/20/2014 FINAL   Final  CULTURE, EXPECTORATED SPUTUM-ASSESSMENT     Status: None   Collection Time    03/16/14 10:30 AM      Result Value Ref Range Status   Specimen  Description SPUTUM   Final   Special Requests NONE   Final   Sputum evaluation     Final   Value: THIS SPECIMEN IS ACCEPTABLE. RESPIRATORY CULTURE REPORT TO FOLLOW.   Report Status 03/16/2014 FINAL   Final  CULTURE, RESPIRATORY (NON-EXPECTORATED)     Status: None   Collection Time    03/16/14 10:30 AM      Result Value Ref Range Status   Specimen Description SPUTUM   Final   Special Requests NONE   Final   Gram Stain     Final   Value: MODERATE WBC PRESENT, PREDOMINANTLY PMN     RARE SQUAMOUS EPITHELIAL CELLS PRESENT     FEW GRAM POSITIVE COCCI IN PAIRS     RARE GRAM NEGATIVE RODS     Performed at [facility]   Culture     Final   Value: NORMAL OROPHARYNGEAL FLORA     Performed at [facility]   Report Status 03/19/2014 FINAL   Final  SURGICAL PCR SCREEN     Status: Abnormal   Collection Time    03/19/14  8:15 PM      Result Value Ref Range Status   MRSA, PCR POSITIVE (*) NEGATIVE Final   Comment: RESULT CALLED  TO, READ BACK BY AND VERIFIED WITH:     Alvie Heidelberg RN [id number] 03/19/14 A BROWNING   Staphylococcus aureus POSITIVE (*) NEGATIVE Final   Comment:            The Xpert SA Assay (FDA     approved for NASAL specimens     in patients over 20 years of age),     is one component of     a comprehensive surveillance     program.  Test performance has     been validated by [employer] for patients greater     than or equal to 70 year old.     It is not intended     to diagnose infection nor to     guide or monitor treatment.    Medical History: Past Medical History  Diagnosis Date  . Hypertension   . Kidney stone   . Sleep apnea   . Asthma   . Seasonal allergies   . Hyperlipemia   . Psoriasis   . Immune deficiency disorder   . Obesity     BMI 42.6    Medications:  Scheduled:  . acetaminophen  1,000 mg Oral 4 times per day   Or  . acetaminophen (TYLENOL) oral liquid 160 mg/5 mL  1,000 mg Oral 4 times per day  . atorvastatin  40 mg Oral QPC  supper  . bisacodyl  10 mg Oral Daily  . fentaNYL   Intravenous 6 times per day  . insulin aspart  0-24 Units Subcutaneous 4 times per day  . ketorolac  30 mg Intravenous 4 times per day  . ketorolac      . mometasone-formoterol  2 puff Inhalation BID  . montelukast  10 mg Oral QHS  . mupirocin ointment  1 application Nasal BID  . pantoprazole  40 mg Oral Daily  . piperacillin-tazobactam (ZOSYN)  IV  3.375 g Intravenous Q8H  . senna-docusate  1 tablet Oral QHS  . vancomycin  1,250 mg Intravenous Q12H   Assessment: CT chest results. Large L sided loculated effusion with findings suggestive of infective/empyema. Resume IV antibiotics, discontinue oral antibiotics. Patient to be transferred to [facility], due to need of higher level of care. Broadening antibiotics due to pneumonia with empyema.  Goal of Therapy:  Vancomycin trough level 15-20 mcg/ml  Plan:  Continue Vancomycin 1250 mg IV q12h Start Zosyn 3.375g IV q8h Stop Ceftriaxone Vancomycin trough at steady state Monitor renal function  Arlean Hopping. Newman Pies, PharmD Clinical Pharmacist Pager [phone] 03/20/2014,7:07 PM

## 2014-03-20 NOTE — Transfer of Care (Signed)
Immediate Anesthesia Transfer of Care Note  Patient: [name] MajorBruce Dishman  Procedure(s) Performed: Procedure(s): VIDEO ASSISTED THORACOSCOPY (VATS)/DECORTICATION (Right)  Patient Location: PACU  Anesthesia Type:General  Level of Consciousness: awake, alert  and oriented  Airway & Oxygen Therapy: Patient Spontanous Breathing and Patient connected to face mask oxygen  Post-op Assessment: Report given to PACU RN and Post -op Vital signs reviewed and stable  Post vital signs: Reviewed and stable  Complications: No apparent anesthesia complications

## 2014-03-20 NOTE — Anesthesia Postprocedure Evaluation (Signed)
  Anesthesia Post-op Note  Patient: [name] [name]  Procedure(s) Performed: Procedure(s): VIDEO ASSISTED THORACOSCOPY (VATS)/DECORTICATION (Right)  Patient Location: PACU  Anesthesia Type:General  Level of Consciousness: awake  Airway and Oxygen Therapy: Patient Spontanous Breathing  Post-op Pain: mild  Post-op Assessment: Post-op Vital signs reviewed  Post-op Vital Signs: Reviewed  Last Vitals:  Filed Vitals:   03/20/14 1410  BP:   Pulse:   Temp: 36.5 C  Resp:     Complications: No apparent anesthesia complications

## 2014-03-20 NOTE — Progress Notes (Signed)
PROGRESS NOTE    Glade Strausser WUJ:[id number] DOB: [birth year] DOA: 03/13/2014 PCP: No primary provider on file.  HPI/Brief narrative 53 year old male with history of hypertension, OSA on nightly CPAP, initially admitted to [facility] with community acquired RLL pneumonia and acute hypoxic respiratory failure. He improved with antibiotic therapy but continued to be hypoxic. CT chest revealed loculated right pleural effusion concerning for empyema and patient was transferred to [facility] on 03/18/14. Cardiothoracic surgery plans VATS on 10/8.  Assessment/Plan:  1. Right lower lobe community acquired pneumonia: Continue azithromycin, IV Rocephin and vancomycin for now. Blood cultures x2: Negative to date. Sputum culture showed normal OP flora.  2. Right empyema: Thoracic surgery consultation appreciated. Plan for right vats for decortication of empyema and long-term IV antibiotics. Surgery planned for 10/8. Discussed with Dr. Donata Clay on 10/7 who will accept postop care to his service in the ICU. TCTS recommends long term IV Abx > Consider narrowing Abx based on pleural fluid results- may consider ID consultation. 3. Acute hypoxic respiratory failure: Secondary to pneumonia and empyema. Management as above. 4. OSA: Continue nightly CPAP 5. Hypertension: Reasonable inpatient control. 6. Left hydronephrosis: Creatinine normal. Outpatient followup. 7. Obesity/BMI 42.6:    Code Status: Full Family Communication: None at bedside. Patient declined MD's offer to discuss with family. Disposition Plan: Not medically stable for DC. Postoperatively, patient to be transferred to ICU under TCTS service. TRH will sign off on 10/8. Please contact us for any further assistance.  Consultants:  TCTS  Procedures:  None  Antibiotics:  Azithromycin 10/1 >  Rocephin 10/1 >  Vancomycin 10/3 >   Subjective: Dyspnea. Pleuritic right lower back pain radiating to anterior chest- pain is better. Mild dry cough. Patient  was seen prior to OR transfer.  Objective: Filed Vitals:   03/19/14 1401 03/19/14 1952 03/19/14 2050 03/20/14 0517  BP: 166/82 140/81  139/82  Pulse: 66 77  72  Temp: 98.4 F (36.9 C) 99.5 F (37.5 C)  98.4 F (36.9 C)  TempSrc: Oral Oral  Oral  Resp: 18 18  18   Height:      Weight:    130.591 kg (287 lb 14.4 oz)  SpO2: 93% 93% 93% 95%    Intake/Output Summary (Last 24 hours) at 03/20/14 0843 Last data filed at 03/20/14 0528  Gross per 24 hour  Intake   1680 ml  Output      0 ml  Net   1680 ml   Filed Weights   03/14/14 0156 03/19/14 0147 03/20/14 0517  Weight: 130.6 kg (287 lb 14.7 oz) 132.8 kg (292 lb 12.3 oz) 130.591 kg (287 lb 14.4 oz)     Exam:  General exam: Pleasant young male lying comfortably propped up in bed. Respiratory system: Diminished breath sounds right lung fields. Clear left lung fields. No increased work of breathing. Cardiovascular system: S1 & S2 heard, RRR. No JVD, murmurs, gallops, clicks or pedal edema.  Gastrointestinal system: Abdomen is nondistended, soft and nontender. Normal bowel sounds heard. Central nervous system: Alert and oriented. No focal neurological deficits. Extremities: Symmetric 5 x 5 power.   Data Reviewed: Basic Metabolic Panel:  Recent Labs Lab 03/15/14 0642 03/16/14 0616 03/17/14 0541 03/19/14 1903 03/20/14 0402  NA 138 139 141 141 139  K 4.1 4.4 4.0 4.0 4.1  CL 103 101 103 101 100  CO2 23 26 27 28 28   GLUCOSE 123* 115* 108* 120* 106*  BUN 12 11 13 14 13   CREATININE 0.90 0.93 1.01 1.10  1.04  CALCIUM 8.3* 9.0 8.9 8.9 8.8   Liver Function Tests:  Recent Labs Lab 03/15/14 0642 03/19/14 1903 03/20/14 0402  AST 10 25 24   ALT 15 31 30   ALKPHOS 78 112 107  BILITOT 0.7 0.4 0.5  PROT 6.4 7.4 7.0  ALBUMIN 2.7* 2.7* 2.6*   No results found for this basename: LIPASE, AMYLASE,  in the last 168 hours No results found for this basename: AMMONIA,  in the last 168 hours CBC:  Recent Labs Lab 03/13/14 2230   03/15/14 0642 03/16/14 0616 03/17/14 0541 03/18/14 0546 03/19/14 1903  WBC 15.4*  < > 20.2* 18.6* 15.1* 13.5* 14.9*  NEUTROABS 12.1*  --   --  14.4* 11.0*  --   --   HGB 13.6  < > 13.4 14.0 13.0 11.6* 12.9*  HCT 39.4  < > 39.3 41.4 38.4* 34.3* 37.7*  MCV 85.7  < > 85.8 86.4 86.1 84.9 84.3  PLT 291  < > 287 342 363 367 426*  < > = values in this interval not displayed. Cardiac Enzymes:  Recent Labs Lab 03/15/14 1411 03/15/14 2000 03/16/14 0204  TROPONINI <0.30 <0.30 <0.30   BNP (last 3 results)  Recent Labs  03/15/14 1411  PROBNP 60.3   CBG: No results found for this basename: GLUCAP,  in the last 168 hours  Recent Results (from the past 240 hour(s))  CULTURE, BLOOD (ROUTINE X 2)     Status: None   Collection Time    03/15/14 10:17 AM      Result Value Ref Range Status   Specimen Description BLOOD LEFT ANTECUBITAL   Final   Special Requests BOTTLES DRAWN AEROBIC AND ANAEROBIC 8CC   Final   Culture NO GROWTH 4 DAYS   Final   Report Status PENDING   Incomplete  CULTURE, BLOOD (ROUTINE X 2)     Status: None   Collection Time    03/15/14 10:17 AM      Result Value Ref Range Status   Specimen Description BLOOD LEFT HAND   Final   Special Requests BOTTLES DRAWN AEROBIC AND ANAEROBIC 6CC   Final   Culture NO GROWTH 4 DAYS   Final   Report Status PENDING   Incomplete  CULTURE, EXPECTORATED SPUTUM-ASSESSMENT     Status: None   Collection Time    03/16/14 10:30 AM      Result Value Ref Range Status   Specimen Description SPUTUM   Final   Special Requests NONE   Final   Sputum evaluation     Final   Value: THIS SPECIMEN IS ACCEPTABLE. RESPIRATORY CULTURE REPORT TO FOLLOW.   Report Status 03/16/2014 FINAL   Final  CULTURE, RESPIRATORY (NON-EXPECTORATED)     Status: None   Collection Time    03/16/14 10:30 AM      Result Value Ref Range Status   Specimen Description SPUTUM   Final   Special Requests NONE   Final   Gram Stain     Final   Value: MODERATE WBC PRESENT,  PREDOMINANTLY PMN     RARE SQUAMOUS EPITHELIAL CELLS PRESENT     FEW GRAM POSITIVE COCCI IN PAIRS     RARE GRAM NEGATIVE RODS     Performed at [facility]   Culture     Final   Value: NORMAL OROPHARYNGEAL FLORA     Performed at [facility]   Report Status 03/19/2014 FINAL   Final  SURGICAL PCR SCREEN  Status: Abnormal   Collection Time    03/19/14  8:15 PM      Result Value Ref Range Status   MRSA, PCR POSITIVE (*) NEGATIVE Final   Comment: RESULT CALLED TO, READ BACK BY AND VERIFIED WITH:     Alvie Heidelberg RN 2232 03/19/14 A BROWNING   Staphylococcus aureus POSITIVE (*) NEGATIVE Final   Comment:            The Xpert SA Assay (FDA     approved for NASAL specimens     in patients over 56 years of age),     is one component of     a comprehensive surveillance     program.  Test performance has     been validated by [employer] for patients greater     than or equal to 46 year old.     It is not intended     to diagnose infection nor to     guide or monitor treatment.        Studies: Chest 2 View  03/20/2014   CLINICAL DATA:  Preoperative evaluation for RIGHT chest surgery, shortness of breath, personal history hypertension, asthma, immune deficiency disorder, empyema  EXAM: CHEST  2 VIEW  COMPARISON:  03/17/2014  FINDINGS: Enlargement of cardiac silhouette.  Mediastinal contours and pulmonary vascularity normal.  Large RIGHT pleural effusion partially loculated particularly at minor fissure.  Significant atelectasis of the RIGHT middle and RIGHT lower lobes, less RIGHT upper lobe.  LEFT lower lobe atelectasis.  LEFT lung otherwise clear.  No pneumothorax or acute osseous findings.  IMPRESSION: Partially loculated RIGHT pleural effusion with significant RIGHT lung atelectasis.  Subsegmental atelectasis LEFT base.  Enlargement of cardiac silhouette.   Electronically Signed   By: Ulyses Southward M.D.   On: 03/20/2014 07:43   Ct Chest W Contrast  03/18/2014   CLINICAL  DATA:  Followup pneumonia and right pleural effusion. Hypoxemia.  EXAM: CT CHEST WITH CONTRAST  TECHNIQUE: Multidetector CT imaging of the chest was performed during intravenous contrast administration.  CONTRAST:  80mL OMNIPAQUE IOHEXOL 300 MG/ML IV.  COMPARISON:  No prior chest CT. Portable chest x-rays yesterday, 03/15/2014. Two-view chest x-rays 03/14/2014, 04/04/2009.  FINDINGS: Large loculated right pleural effusion with involvement of the right pleural space laterally, medially, at the apex, and at the base. Scattered linear parietal pleural enhancement is present, though there are no discrete pleural masses. Airspace consolidation with air bronchograms in the right lower lobe and centrally in the right upper lobe. Linear atelectasis in the right middle lobe. No left pleural effusion. Minimal atelectasis involving the left lower lobe and lingula. Left lung otherwise clear. Central airways patent without a visible obstructing mass.  Heart slightly enlarged with left ventricular predominance. No pericardial effusion. Mild LAD coronary atherosclerosis suspected. No visible atherosclerosis involving the thoracic or upper abdominal aorta or their visualized branches.  Scattered normal sized lymph nodes throughout the mediastinum. No pathologically enlarged mediastinal, hilar or axillary lymph nodes. Thyroid gland normal in appearance.  Focus of accessory splenic tissue medial to the lower pole of the spleen. Visualized upper abdomen otherwise unremarkable. Bone window images demonstrate mid and lower thoracic spondylosis.  IMPRESSION: 1. Large loculated right pleural effusion. Enhancement of the parietal pleura is consistent with infection/empyema. 2. Pneumonia involving the right lower lobe and the central right upper lobe. Linear atelectasis involving the right middle lobe. Minimal atelectasis involving the left lower lobe and lingula. 3. Mild cardiomegaly with left  ventricular predominance. No evidence of  pulmonary edema.   Electronically Signed   By: Hulan Saas M.D.   On: 03/18/2014 15:09        Scheduled Meds: . atorvastatin  40 mg Oral QPC supper  . azithromycin  500 mg Intravenous Q24H  . cefTRIAXone (ROCEPHIN)  IV  1 g Intravenous Q24H  . cefUROXime (ZINACEF)  IV  1.5 g Intravenous 60 min Pre-Op  . Chlorhexidine Gluconate Cloth  6 each Topical Q0600  . heparin  5,000 Units Subcutaneous 3 times per day  . lisinopril  10 mg Oral Daily  . mometasone-formoterol  2 puff Inhalation BID  . montelukast  10 mg Oral QHS  . mupirocin ointment  1 application Nasal BID  . pantoprazole  40 mg Oral Daily  . senna  1 tablet Oral BID  . sodium chloride  3 mL Intravenous Q12H  . vancomycin  1,250 mg Intravenous Q12H   Continuous Infusions:   Principal Problem:   CAP (community acquired pneumonia) Active Problems:   Hydronephrosis, left   Back pain   Hypertension   Sleep apnea   Hyperlipemia   Obesity   Empyema of right pleural space    Time spent: 30 minutes.    Marcellus Scott, MD, FACP, FHM. [facility] Pager [phone]  If 7PM-7AM, please contact night-coverage [email or web address] Password [email or web address] 03/20/2014, 8:43 AM    LOS: 7 days

## 2014-03-20 NOTE — Progress Notes (Signed)
Fentanyl 90 mcg cleared

## 2014-03-20 NOTE — Progress Notes (Signed)
PCA cleared, 75 mcg

## 2014-03-20 NOTE — Progress Notes (Signed)
Patient ID: [name] MajorBruce [name], male   DOB: 10/11/1960, 53 y.o.   MRN: [id number] EVENING ROUNDS NOTE :     [address]       [name] [id number]             [phone]                 Day of Surgery Procedure(s) (LRB): VIDEO ASSISTED THORACOSCOPY (VATS)/DECORTICATION (Right)  Total Length of Stay:  LOS: 7 days  BP 136/75  Pulse 65  Temp(Src) 98.5 F (36.9 C) (Oral)  Resp 17  Ht 5\' 9"  (1.753 m)  Wt 287 lb 14.4 oz (130.591 kg)  BMI 42.50 kg/m2  SpO2 93%  .Intake/Output     10/08 0701 - 10/09 0700   P.O. 0   I.V. (mL/kg) 2200 (16.8)   IV Piggyback 250   Total Intake(mL/kg) 2450 (18.8)   Urine (mL/kg/hr) 1160 (0.7)   Blood 1200 (0.8)   Chest Tube 290 (0.2)   Total Output 2650   Net -200       Urine Occurrence 2 x     . dextrose 5 % and 0.9 % NaCl with KCl 20 mEq/L       Lab Results  Component Value Date   WBC 19.5* 03/20/2014   HGB 11.3* 03/20/2014   HCT 33.5* 03/20/2014   PLT 378 03/20/2014   GLUCOSE 154* 03/20/2014   ALT 25 03/20/2014   AST 24 03/20/2014   NA 137 03/20/2014   K 4.5 03/20/2014   CL 100 03/20/2014   CREATININE 0.82 03/20/2014   BUN 11 03/20/2014   CO2 24 03/20/2014   TSH 3.900 03/19/2014   INR 1.22 03/19/2014   Stable in icu post op  totall 300 ml from chest tubes since surgery  Delight OvensEdward B Gracilyn Gunia MD  Beeper [phone] 03/20/2014 7:10 PM

## 2014-03-20 NOTE — Progress Notes (Signed)
The patient was examined and preop studies reviewed. There has been no change from the prior exam and the patient is ready for surgery.   Plan Right VATS and decortication of empyema on B [name] [name] today

## 2014-03-20 NOTE — Progress Notes (Signed)
Dr. Morton PetersVan Tright at bedside. Updated on labs and  Post op

## 2014-03-20 NOTE — Anesthesia Preprocedure Evaluation (Addendum)
Anesthesia Evaluation  Patient identified by MRN, date of birth, ID band Patient awake    Reviewed: Allergy & Precautions, H&P , NPO status , Patient's Chart, lab work & pertinent test results  History of Anesthesia Complications Negative for: history of anesthetic complications  Airway Mallampati: II TM Distance: >3 FB Neck ROM: Full    Dental  (+) Teeth Intact, Dental Advisory Given   Pulmonary asthma , sleep apnea , pneumonia -, unresolved,  + rhonchi   + decreased breath sounds      Cardiovascular hypertension, Pt. on medications Rate:Normal     Neuro/Psych    GI/Hepatic negative GI ROS,   Endo/Other    Renal/GU Renal disease     Musculoskeletal   Abdominal   Peds  Hematology   Anesthesia Other Findings   Reproductive/Obstetrics                          Anesthesia Physical Anesthesia Plan  ASA: III  Anesthesia Plan: General   Post-op Pain Management:    Induction: Intravenous  Airway Management Planned: Double Lumen EBT  Additional Equipment: Arterial line, Ultrasound Guidance Line Placement and CVP  Intra-op Plan:   Post-operative Plan: Extubation in OR and Possible Post-op intubation/ventilation  Informed Consent: I have reviewed the patients History and Physical, chart, labs and discussed the procedure including the risks, benefits and alternatives for the proposed anesthesia with the patient or authorized representative who has indicated his/her understanding and acceptance.   Dental advisory given  Plan Discussed with: Anesthesiologist, Surgeon and CRNA  Anesthesia Plan Comments:        Anesthesia Quick Evaluation

## 2014-03-21 ENCOUNTER — Inpatient Hospital Stay (HOSPITAL_COMMUNITY): Payer: BC Managed Care – PPO

## 2014-03-21 ENCOUNTER — Encounter (HOSPITAL_COMMUNITY): Payer: Self-pay | Admitting: Cardiothoracic Surgery

## 2014-03-21 LAB — POCT I-STAT 4, (NA,K, GLUC, HGB,HCT)
Glucose, Bld: 118 mg/dL — ABNORMAL HIGH (ref 70–99)
HCT: 29 % — ABNORMAL LOW (ref 39.0–52.0)
Hemoglobin: 9.9 g/dL — ABNORMAL LOW (ref 13.0–17.0)
Potassium: 3.7 mEq/L (ref 3.7–5.3)
Sodium: 140 mEq/L (ref 137–147)

## 2014-03-21 LAB — GLUCOSE, CAPILLARY
Glucose-Capillary: 102 mg/dL — ABNORMAL HIGH (ref 70–99)
Glucose-Capillary: 121 mg/dL — ABNORMAL HIGH (ref 70–99)
Glucose-Capillary: 123 mg/dL — ABNORMAL HIGH (ref 70–99)
Glucose-Capillary: 157 mg/dL — ABNORMAL HIGH (ref 70–99)

## 2014-03-21 LAB — BASIC METABOLIC PANEL
ANION GAP: 15 (ref 5–15)
BUN: 11 mg/dL (ref 6–23)
CALCIUM: 8 mg/dL — AB (ref 8.4–10.5)
CHLORIDE: 101 meq/L (ref 96–112)
CO2: 25 mEq/L (ref 19–32)
CREATININE: 1 mg/dL (ref 0.50–1.35)
GFR calc Af Amer: >90 mL/min (ref >90)
GFR calc non Af Amer: 85 mL/min — ABNORMAL LOW (ref >90)
Glucose, Bld: 131 mg/dL — ABNORMAL HIGH (ref 70–99)
Potassium: 4.4 mEq/L (ref 3.7–5.3)
SODIUM: 141 meq/L (ref 137–147)

## 2014-03-21 LAB — CBC
HCT: 32.1 % — ABNORMAL LOW (ref 39.0–52.0)
Hemoglobin: 10.6 g/dL — ABNORMAL LOW (ref 13.0–17.0)
MCH: 27.9 pg (ref 26.0–34.0)
MCHC: 33 g/dL (ref 30.0–36.0)
MCV: 84.5 fL (ref 78.0–100.0)
PLATELETS: 386 10*3/uL (ref 150–400)
RBC: 3.8 MIL/uL — AB (ref 4.22–5.81)
RDW: 15.1 % (ref 11.5–15.5)
WBC: 14.4 10*3/uL — AB (ref 4.0–10.5)

## 2014-03-21 LAB — VANCOMYCIN, TROUGH: Vancomycin Tr: 6.6 ug/mL — ABNORMAL LOW (ref 10.0–20.0)

## 2014-03-21 MED ORDER — FUROSEMIDE 10 MG/ML IJ SOLN
20.0000 mg | Freq: Two times a day (BID) | INTRAMUSCULAR | Status: DC
  Administered 2014-03-21 – 2014-03-25 (×9): 20 mg via INTRAVENOUS
  Filled 2014-03-21 (×13): qty 2

## 2014-03-21 MED ORDER — CHLORHEXIDINE GLUCONATE CLOTH 2 % EX PADS: 6.0000 | MEDICATED_PAD | Freq: Every day | CUTANEOUS | Status: DC

## 2014-03-21 MED ORDER — VANCOMYCIN HCL 10 G IV SOLR
1250.0000 mg | Freq: Three times a day (TID) | INTRAVENOUS | Status: AC
  Administered 2014-03-21 – 2014-03-24 (×11): 1250 mg via INTRAVENOUS
  Filled 2014-03-21 (×13): qty 1250

## 2014-03-21 NOTE — Progress Notes (Signed)
1 Day Post-Op Procedure(s) (LRB): VIDEO ASSISTED THORACOSCOPY (VATS)/DECORTICATION (Right) Subjective: OOB to chair Min CT output VSS Will tx to 3 S  Objective: Vital signs in last 24 hours: Temp:  [97.7 F (36.5 C)-98.6 F (37 C)] 98.5 F (36.9 C) (10/09 0400) Pulse Rate:  [52-83] 70 (10/09 0700) Cardiac Rhythm:  [-] Normal sinus rhythm (10/09 0600) Resp:  [13-29] 19 (10/09 0700) BP: (105-158)/(48-88) 121/64 mmHg (10/09 0700) SpO2:  [89 %-98 %] 96 % (10/09 0700) Arterial Line BP: (77-154)/(52-104) 109/89 mmHg (10/09 0325) Weight:  [291 lb 8 oz (132.224 kg)] 291 lb 8 oz (132.224 kg) (10/09 0615)  Hemodynamic parameters for last 24 hours:   satble Intake/Output from previous day: 10/08 0701 - 10/09 0700 In: 4463.7 [I.V.:3863.7; IV Piggyback:600] Out: 3980 [Urine:2320; Blood:1200; Chest Tube:460] Intake/Output this shift:    No air leak Dec breath sounds  Lab Results:  Recent Labs  03/20/14 1500 03/21/14 0410  WBC 19.5* 14.4*  HGB 11.3* 10.6*  HCT 33.5* 32.1*  PLT 378 386   BMET:  Recent Labs  03/20/14 1549 03/21/14 0410  NA 137 141  K 4.5 4.4  CL 100 101  CO2 24 25  GLUCOSE 154* 131*  BUN 11 11  CREATININE 0.82 1.00  CALCIUM 8.1* 8.0*    PT/INR:  Recent Labs  03/19/14 1903  LABPROT 15.4*  INR 1.22   ABG    Component Value Date/Time   PHART 7.402 03/20/2014 1515   HCO3 25.1* 03/20/2014 1515   TCO2 26.3 03/20/2014 1515   O2SAT 94.6 03/20/2014 1515   CBG (last 3)   Recent Labs  03/20/14 2357 03/21/14 0624  GLUCAP 157* 121*    Assessment/Plan: S/P Procedure(s) (LRB): VIDEO ASSISTED THORACOSCOPY (VATS)/DECORTICATION (Right) Cont 3 chest tubes, vanc and Zosyn   LOS: 8 days    VAN TRIGT III,PETER 03/21/2014

## 2014-03-21 NOTE — Plan of Care (Signed)
Problem: Phase I Progression Outcomes Goal: Initial discharge plan identified Outcome: Completed/Met Date Met:  03/21/14 Home with wife     

## 2014-03-21 NOTE — Progress Notes (Signed)
POD # 1  Resting comfortably  BP 119/69  Pulse 63  Temp(Src) 98.2 F (36.8 C) (Oral)  Resp 21  Ht 5\' 9"  (1.753 m)  Wt 291 lb 8 oz (132.224 kg)  BMI 43.03 kg/m2  SpO2 97%    Intake/Output Summary (Last 24 hours) at 03/21/14 1814 Last data filed at 03/21/14 1600  Gross per 24 hour  Intake 2481.84 ml  Output   2755 ml  Net -273.16 ml    Awaiting stepdown bed

## 2014-03-21 NOTE — Plan of Care (Signed)
Problem: Phase II Progression Outcomes Goal: Discharge plan established Outcome: Completed/Met Date Met:  03/21/14 Home with wife.

## 2014-03-21 NOTE — Op Note (Signed)
NAMFenton [name]:  Radwan, Dayson                  ACCOUNT NO.:  [id number]  MEDICAL RECORD NO.:  [id number]  LOCATION:  2S07C                        FACILITY:  [facility]  PHYSICIAN:  Kerin PernaPeter Van Trigt, M.D.  DATE OF BIRTH:  [birth year]  DATE OF PROCEDURE:  03/20/2014 DATE OF DISCHARGE:                              OPERATIVE REPORT   OPERATIONS:  Right VATS (video-assisted thoracoscopic surgery) with mini thoracotomy and drainage of empyema, decortication of right lung.  PREOPERATIVE DIAGNOSIS:  Right lower lobe pneumonia with empyema.  POSTOPERATIVE DIAGNOSIS:  Right lower lobe pneumonia with empyema.  SURGEON:  Kerin PernaPeter Van Trigt, M.D.  ASSISTANT:  Doree Fudgeonielle Zimmerman, PA-C.  ANESTHESIA:  General by Dr. Judie Petitharlene Edwards.  INDICATIONS:  The patient is a 53 year old, morbidly obese male, transferred from outside hospital with a history of progressive right lower chest pain, shortness of breath, and cough.  Chest x-ray and subsequent CT scan demonstrated right lower lobe pneumonia with a low large loculated empyema.  The patient is a nonsmoker.  He was transferred to this hospital for thoracic surgical treatment of his empyema.  Prior to surgery, I discussed the results of the CT scan with the patient and the indications, expected benefits and expected recovery after VATS and decortication - drainage of a right empyema.  I discussed the major details of the surgery including the use of general anesthesia, location of the surgical incision, and the postoperative chest tube drainage strategy.  I discussed the risks to him of the operation including the risks of bleeding, ventilator dependence, prolonged air leak, MI, stroke, recurrent empyema or pneumonia, and death.  After reviewing these issues, he demonstrated his understanding and agreed to proceed with surgery under what I felt was an informed consent.  OPERATIVE FINDINGS: 1. Severe loculated empyema with entrapment of the right lower lobe,  right middle lobe and right upper lobe. 2. Specimen of the pleural peel sent for culture as well as pathology. 3. Pleural fluid sent for culture and cytology.  DESCRIPTION OF PROCEDURE:  The patient was brought to the operating room and placed supine on the operating room table where general anesthesia was induced under invasive hemodynamic monitoring.  A double-lumen endotracheal tube was positioned.  The patient was positioned, right side up.  The right chest was prepped and draped as a sterile field.  A proper time-out was performed.  A small incision was made medially from the tip of the right scapula.  The right lung was collapsed and the pleural space was entered.  The camera could not visualize any normal structures because the pleural space was obliterated with adhesions and glue-like material of empyema.  The scope was removed and incision was extended approximately 6 cm.  Mini retractor was used and the right pleural space was entered.  There was dense adhesions and loculated thick material as well as soft plaque-like material on the visceral pleura.  The upper lobe, middle lobe and lower lobe were all ultimately mobilized.  The space between the lower lobe and diaphragm was opened, debrided and drained.  The lateral chest wall space posteriorly was opened, debrided and drained.  There was also a peel over the upper lobe and  this was removed.  After adequate decortication of all three lobes was accomplished, the pleural space was irrigated with warm saline. Three drainage tubes were placed to drain the anterior-posterior and subpulmonic space of the right pleural space and these chest tubes were brought out through separate incisions and secured the skin with silk sutures.  The lung was re-expanded under direct vision.  Pericostal #2 Vicryl sutures were placed to reapproximate the ribs.  The muscle layers were closed with interrupted #1 Vicryl.  Subcutaneous layer was closed  with a running 2-0 Vicryl and skin was closed with interrupted skin staples. Sterile dressings were applied.  The plan was for the patient to be extubated in the operating room, transported to the ICU.  An x-ray in the operating showed good re-expansion of the right lung with chest tubes in good position without pneumothorax.     Kerin PernaPeter Van Trigt, M.D.     PV/MEDQ  D:  03/20/2014  T:  03/21/2014  Job:  [id number]

## 2014-03-21 NOTE — Progress Notes (Signed)
Pharmacy: Vancomycin  53yom continues on day #6 vancomycin for RLL pneumonia with empyema s/p VATS 10/8.Vancomycin trough is below goal at 6.6 (goal 15-20). Small increase in sCr 0.8-->1, UOP remains good.  Azithro 10/2 >> 10/8 Rocephin 10/2>>10/8 Zosyn 10/8>> Vancomycin 10/4 >> PO Zinacef 10/5 >> 10/6 Pre-op IV Zinacef x 1 10/8  10/3 blood x 2 - neg 10/4 sputum - neg 10/8 right pleural fluid - pending 10/8 pleural tissue - pending  Plan: 1) Increase vancomycin to 1250mg  IV q8 2) Re-check trough at new steady state  Louie CasaJennifer Cleopha Indelicato, PharmD, BCPS 03/21/2014, 8:57 AM

## 2014-03-22 ENCOUNTER — Inpatient Hospital Stay (HOSPITAL_COMMUNITY): Payer: BC Managed Care – PPO

## 2014-03-22 LAB — GLUCOSE, CAPILLARY
GLUCOSE-CAPILLARY: 104 mg/dL — AB (ref 70–99)
GLUCOSE-CAPILLARY: 142 mg/dL — AB (ref 70–99)
Glucose-Capillary: 130 mg/dL — ABNORMAL HIGH (ref 70–99)

## 2014-03-22 LAB — COMPREHENSIVE METABOLIC PANEL
ALK PHOS: 83 U/L (ref 39–117)
ALT: 31 U/L (ref 0–53)
ANION GAP: 9 (ref 5–15)
AST: 28 U/L (ref 0–37)
Albumin: 2 g/dL — ABNORMAL LOW (ref 3.5–5.2)
BILIRUBIN TOTAL: 0.3 mg/dL (ref 0.3–1.2)
BUN: 15 mg/dL (ref 6–23)
CHLORIDE: 104 meq/L (ref 96–112)
CO2: 28 mEq/L (ref 19–32)
CREATININE: 1.12 mg/dL (ref 0.50–1.35)
Calcium: 8.1 mg/dL — ABNORMAL LOW (ref 8.4–10.5)
GFR calc non Af Amer: 74 mL/min — ABNORMAL LOW (ref >90)
GFR, EST AFRICAN AMERICAN: 86 mL/min — AB (ref >90)
GLUCOSE: 107 mg/dL — AB (ref 70–99)
POTASSIUM: 4.6 meq/L (ref 3.7–5.3)
Sodium: 141 mEq/L (ref 137–147)
Total Protein: 5.7 g/dL — ABNORMAL LOW (ref 6.0–8.3)

## 2014-03-22 LAB — CBC
HEMATOCRIT: 30.8 % — AB (ref 39.0–52.0)
HEMOGLOBIN: 10.3 g/dL — AB (ref 13.0–17.0)
MCH: 28.4 pg (ref 26.0–34.0)
MCHC: 33.4 g/dL (ref 30.0–36.0)
MCV: 84.8 fL (ref 78.0–100.0)
Platelets: 378 10*3/uL (ref 150–400)
RBC: 3.63 MIL/uL — ABNORMAL LOW (ref 4.22–5.81)
RDW: 15.1 % (ref 11.5–15.5)
WBC: 14.3 10*3/uL — ABNORMAL HIGH (ref 4.0–10.5)

## 2014-03-22 MED ORDER — CHLORHEXIDINE GLUCONATE CLOTH 2 % EX PADS
6.0000 | MEDICATED_PAD | Freq: Every day | CUTANEOUS | Status: DC
  Administered 2014-03-22 – 2014-03-24 (×3): 6 via TOPICAL

## 2014-03-22 MED ORDER — FLUTICASONE PROPIONATE 50 MCG/ACT NA SUSP
1.0000 | Freq: Every day | NASAL | Status: DC | PRN
  Filled 2014-03-22: qty 16

## 2014-03-22 MED ORDER — ZOLPIDEM TARTRATE 5 MG PO TABS: 10.0000 mg | ORAL_TABLET | Freq: Every evening | ORAL | Status: DC | PRN

## 2014-03-22 MED ORDER — ENOXAPARIN SODIUM 40 MG/0.4ML ~~LOC~~ SOLN
40.0000 mg | Freq: Every day | SUBCUTANEOUS | Status: DC
  Administered 2014-03-22 – 2014-03-25 (×4): 40 mg via SUBCUTANEOUS
  Filled 2014-03-22 (×4): qty 0.4

## 2014-03-22 MED ORDER — LISINOPRIL 10 MG PO TABS
10.0000 mg | ORAL_TABLET | Freq: Every day | ORAL | Status: DC
  Administered 2014-03-22 – 2014-03-25 (×4): 10 mg via ORAL
  Filled 2014-03-22 (×5): qty 1

## 2014-03-22 NOTE — Progress Notes (Signed)
No complaints  Up in chair visiting with family  BP 139/76  Pulse 70  Temp(Src) 97.3 F (36.3 C) (Oral)  Resp 21  Ht 5\' 9"  (1.753 m)  Wt 291 lb 8 oz (132.224 kg)  BMI 43.03 kg/m2  SpO2 96%   Intake/Output Summary (Last 24 hours) at 03/22/14 1821 Last data filed at 03/22/14 1615  Gross per 24 hour  Intake 2771.2 ml  Output   1170 ml  Net 1601.2 ml    Awaiting stepdown bed

## 2014-03-22 NOTE — Progress Notes (Signed)
2 Days Post-Op Procedure(s) (LRB): VIDEO ASSISTED THORACOSCOPY (VATS)/DECORTICATION (Right) Subjective: Some incisional pain Trouble sleeping last night  Objective: Vital signs in last 24 hours: Temp:  [97.7 F (36.5 C)-98.3 F (36.8 C)] 98.1 F (36.7 C) (10/10 0800) Pulse Rate:  [50-137] 77 (10/10 0800) Cardiac Rhythm:  [-] Normal sinus rhythm (10/10 0800) Resp:  [12-21] 20 (10/10 0800) BP: (101-167)/(61-87) 167/84 mmHg (10/10 0800) SpO2:  [83 %-99 %] 96 % (10/10 0800)  Hemodynamic parameters for last 24 hours:    Intake/Output from previous day: 10/09 0701 - 10/10 0700 In: 3070.4 [P.O.:1800; I.V.:370.4; IV Piggyback:900] Out: 1295 [Urine:1175; Chest Tube:120] Intake/Output this shift:    General appearance: alert and no distress Neurologic: intact Heart: regular rate and rhythm Lungs: diminished breath sounds bibasilar Abdomen: normal findings: soft, non-tender no air leak  Lab Results:  Recent Labs  03/21/14 0410 03/22/14 0500  WBC 14.4* 14.3*  HGB 10.6* 10.3*  HCT 32.1* 30.8*  PLT 386 378   BMET:  Recent Labs  03/21/14 0410 03/22/14 0500  NA 141 141  K 4.4 4.6  CL 101 104  CO2 25 28  GLUCOSE 131* 107*  BUN 11 15  CREATININE 1.00 1.12  CALCIUM 8.0* 8.1*    PT/INR:  Recent Labs  03/19/14 1903  LABPROT 15.4*  INR 1.22   ABG    Component Value Date/Time   PHART 7.402 03/20/2014 1515   HCO3 25.1* 03/20/2014 1515   TCO2 26.3 03/20/2014 1515   O2SAT 94.6 03/20/2014 1515   CBG (last 3)   Recent Labs  03/21/14 1805 03/22/14 0014 03/22/14 0623  GLUCAP 123* 130* 104*    Assessment/Plan: S/P Procedure(s) (LRB): VIDEO ASSISTED THORACOSCOPY (VATS)/DECORTICATION (Right) Plan for transfer to step-down: see transfer orders Awaiting 3S bed  No air leak- will dc anterior CT today  Hypertension- resume lisinopril  SCDs for DVT prophylaxis  CBG ok- dc CBG/ SSI  ambulate   LOS: 9 days    [name] [name] C 03/22/2014

## 2014-03-23 ENCOUNTER — Inpatient Hospital Stay (HOSPITAL_COMMUNITY): Payer: BC Managed Care – PPO

## 2014-03-23 LAB — TYPE AND SCREEN
ABO/RH(D): O POS
Antibody Screen: NEGATIVE
Unit division: 0
Unit division: 0

## 2014-03-23 LAB — BASIC METABOLIC PANEL
ANION GAP: 16 — AB (ref 5–15)
BUN: 16 mg/dL (ref 6–23)
CALCIUM: 8.2 mg/dL — AB (ref 8.4–10.5)
CO2: 24 mEq/L (ref 19–32)
Chloride: 101 mEq/L (ref 96–112)
Creatinine, Ser: 1.11 mg/dL (ref 0.50–1.35)
GFR calc Af Amer: 87 mL/min — ABNORMAL LOW (ref >90)
GFR, EST NON AFRICAN AMERICAN: 75 mL/min — AB (ref >90)
Glucose, Bld: 102 mg/dL — ABNORMAL HIGH (ref 70–99)
Potassium: 4.4 mEq/L (ref 3.7–5.3)
SODIUM: 141 meq/L (ref 137–147)

## 2014-03-23 LAB — BODY FLUID CULTURE: CULTURE: NO GROWTH

## 2014-03-23 LAB — CBC
HCT: 32.9 % — ABNORMAL LOW (ref 39.0–52.0)
Hemoglobin: 10.8 g/dL — ABNORMAL LOW (ref 13.0–17.0)
MCH: 28.3 pg (ref 26.0–34.0)
MCHC: 32.8 g/dL (ref 30.0–36.0)
MCV: 86.4 fL (ref 78.0–100.0)
PLATELETS: 467 10*3/uL — AB (ref 150–400)
RBC: 3.81 MIL/uL — ABNORMAL LOW (ref 4.22–5.81)
RDW: 14.9 % (ref 11.5–15.5)
WBC: 12.4 10*3/uL — ABNORMAL HIGH (ref 4.0–10.5)

## 2014-03-23 LAB — VANCOMYCIN, TROUGH: Vancomycin Tr: 17.5 ug/mL (ref 10.0–20.0)

## 2014-03-23 NOTE — Plan of Care (Signed)
Problem: Discharge Progression Outcomes Goal: Pain controlled with appropriate interventions Outcome: Completed/Met Date Met:  03/23/14 Pain appropriately controlled with PCA, Tylenol, and Toradol.

## 2014-03-23 NOTE — Progress Notes (Signed)
Resting comfortably  BP 129/82  Pulse 62  Temp(Src) 98.6 F (37 C) (Axillary)  Resp 18  Ht 5\' 9"  (1.753 m)  Wt 291 lb 8 oz (132.224 kg)  BMI 43.03 kg/m2  SpO2 96%   Intake/Output Summary (Last 24 hours) at 03/23/14 1802 Last data filed at 03/23/14 1500  Gross per 24 hour  Intake 2421.17 ml  Output    487 ml  Net 1934.17 ml   Posterior CT removed  Doing well  Awaiting 3S bed

## 2014-03-23 NOTE — Plan of Care (Signed)
Problem: Discharge Progression Outcomes Goal: Tolerating diet Outcome: Completed/Met Date Met:  03/23/14 Eating full meals and eliminating well; stool and urine.

## 2014-03-23 NOTE — Progress Notes (Signed)
3 Days Post-Op Procedure(s) (LRB): VIDEO ASSISTED THORACOSCOPY (VATS)/DECORTICATION (Right) Subjective: Feels well + productive cough  Objective: Vital signs in last 24 hours: Temp:  [97.3 F (36.3 C)-98.7 F (37.1 C)] 98 F (36.7 C) (10/11 0800) Pulse Rate:  [63-86] 70 (10/11 0800) Cardiac Rhythm:  [-] Normal sinus rhythm (10/10 2027) Resp:  [13-25] 17 (10/11 0800) BP: (126-151)/(73-97) 143/77 mmHg (10/11 0400) SpO2:  [77 %-97 %] 95 % (10/11 0800)  Hemodynamic parameters for last 24 hours:    Intake/Output from previous day: 10/10 0701 - 10/11 0700 In: 2607.5 [P.O.:1570; I.V.:220.5; IV Piggyback:817] Out: 1660 [Urine:1600; Chest Tube:60] Intake/Output this shift: Total I/O In: 56 [I.V.:56] Out: -   General appearance: alert and no distress Neurologic: intact Heart: regular rate and rhythm Lungs: diminished breath sounds bibasilar Abdomen: normal findings: soft, non-tender no air leak  Lab Results:  Recent Labs  03/22/14 0500 03/23/14 0342  WBC 14.3* 12.4*  HGB 10.3* 10.8*  HCT 30.8* 32.9*  PLT 378 467*   BMET:  Recent Labs  03/22/14 0500 03/23/14 0342  NA 141 141  K 4.6 4.4  CL 104 101  CO2 28 24  GLUCOSE 107* 102*  BUN 15 16  CREATININE 1.12 1.11  CALCIUM 8.1* 8.2*    PT/INR: No results found for this basename: LABPROT, INR,  in the last 72 hours ABG    Component Value Date/Time   PHART 7.402 03/20/2014 1515   HCO3 25.1* 03/20/2014 1515   TCO2 26.3 03/20/2014 1515   O2SAT 94.6 03/20/2014 1515   CBG (last 3)   Recent Labs  03/22/14 0014 03/22/14 0623 03/22/14 1211  GLUCAP 130* 104* 142*    Assessment/Plan: S/P Procedure(s) (LRB): VIDEO ASSISTED THORACOSCOPY (VATS)/DECORTICATION (Right) Plan for transfer to step-down: see transfer orders Awaiting bed on 2000  Drainage down- dc posterior CT  CXr shows good aeration  Ambulating well   LOS: 10 days    Thais Silberstein C 03/23/2014

## 2014-03-23 NOTE — Progress Notes (Signed)
ANTIBIOTIC CONSULT NOTE - FOLLOW UP  Pharmacy Consult for Vancomycin Indication: pneumonia  No Known Allergies  Patient Measurements: Height: 5\' 9"  (175.3 cm) Weight: 291 lb 8 oz (132.224 kg) IBW/kg (Calculated) : 70.7 Adjusted Body Weight:    Vital Signs: Temp: 98.6 F (37 C) (10/11 1141) Temp Source: Axillary (10/11 1141) BP: 129/82 mmHg (10/11 1600) Pulse Rate: 66 (10/11 1600) Intake/Output from previous day: 10/10 0701 - 10/11 0700 In: 2607.5 [P.O.:1570; I.V.:220.5; IV Piggyback:817] Out: 1660 [Urine:1600; Chest Tube:60] Intake/Output from this shift: Total I/O In: 909.7 [P.O.:520; I.V.:102.2; IV Piggyback:287.5] Out: 477 [Urine:476; Stool:1]  Labs:  Recent Labs  03/21/14 0410 03/22/14 0500 03/23/14 0342  WBC 14.4* 14.3* 12.4*  HGB 10.6* 10.3* 10.8*  PLT 386 378 467*  CREATININE 1.00 1.12 1.11   Estimated Creatinine Clearance: 104.9 ml/min (by C-G formula based on Cr of 1.11).  Recent Labs  03/21/14 0730 03/23/14 1545  VANCOTROUGH 6.6* 17.5     Microbiology: Recent Results (from the past 720 hour(s))  CULTURE, BLOOD (ROUTINE X 2)     Status: None   Collection Time    03/15/14 10:17 AM      Result Value Ref Range Status   Specimen Description BLOOD LEFT ANTECUBITAL   Final   Special Requests BOTTLES DRAWN AEROBIC AND ANAEROBIC 8CC   Final   Culture NO GROWTH 5 DAYS   Final   Report Status 03/20/2014 FINAL   Final  CULTURE, BLOOD (ROUTINE X 2)     Status: None   Collection Time    03/15/14 10:17 AM      Result Value Ref Range Status   Specimen Description BLOOD LEFT HAND   Final   Special Requests BOTTLES DRAWN AEROBIC AND ANAEROBIC 6CC   Final   Culture NO GROWTH 5 DAYS   Final   Report Status 03/20/2014 FINAL   Final  CULTURE, EXPECTORATED SPUTUM-ASSESSMENT     Status: None   Collection Time    03/16/14 10:30 AM      Result Value Ref Range Status   Specimen Description SPUTUM   Final   Special Requests NONE   Final   Sputum evaluation      Final   Value: THIS SPECIMEN IS ACCEPTABLE. RESPIRATORY CULTURE REPORT TO FOLLOW.   Report Status 03/16/2014 FINAL   Final  CULTURE, RESPIRATORY (NON-EXPECTORATED)     Status: None   Collection Time    03/16/14 10:30 AM      Result Value Ref Range Status   Specimen Description SPUTUM   Final   Special Requests NONE   Final   Gram Stain     Final   Value: MODERATE WBC PRESENT, PREDOMINANTLY PMN     RARE SQUAMOUS EPITHELIAL CELLS PRESENT     FEW GRAM POSITIVE COCCI IN PAIRS     RARE GRAM NEGATIVE RODS     Performed at [facility]   Culture     Final   Value: NORMAL OROPHARYNGEAL FLORA     Performed at [facility]   Report Status 03/19/2014 FINAL   Final  SURGICAL PCR SCREEN     Status: Abnormal   Collection Time    03/19/14  8:15 PM      Result Value Ref Range Status   MRSA, PCR POSITIVE (*) NEGATIVE Final   Comment: RESULT CALLED TO, READ BACK BY AND VERIFIED WITH:     Alvie Heidelberg RN 1610 03/19/14 A BROWNING   Staphylococcus aureus POSITIVE (*) NEGATIVE Final  Comment:            The Xpert SA Assay (FDA     approved for NASAL specimens     in patients over 52 years of age),     is one component of     a comprehensive surveillance     program.  Test performance has     been validated by [facility] for patients greater     than or equal to 52 year old.     It is not intended     to diagnose infection nor to     guide or monitor treatment.  BODY FLUID CULTURE     Status: None   Collection Time    03/20/14 11:57 AM      Result Value Ref Range Status   Specimen Description FLUID RIGHT PLEURAL   Final   Special Requests POF ROCEPHIN   Final   Gram Stain     Final   Value: RARE WBC PRESENT, PREDOMINANTLY PMN     NO ORGANISMS SEEN     Performed at [facility]   Culture     Final   Value: NO GROWTH 3 DAYS     Performed at [facility]   Report Status 03/23/2014 FINAL   Final  FUNGUS CULTURE W SMEAR     Status: None   Collection Time     03/20/14 11:57 AM      Result Value Ref Range Status   Specimen Description FLUID RIGHT PLEURAL   Final   Special Requests POF ROCEPHIN   Final   Fungal Smear     Final   Value: NO YEAST OR FUNGAL ELEMENTS SEEN     Performed at [facility]   Culture     Final   Value: CULTURE IN PROGRESS FOR FOUR WEEKS     Performed at [facility]   Report Status PENDING   Incomplete  AFB CULTURE WITH SMEAR     Status: None   Collection Time    03/20/14 11:57 AM      Result Value Ref Range Status   Specimen Description FLUID RIGHT PLEURAL   Final   Special Requests POF ROCEPHIN   Final   Acid Fast Smear     Final   Value: NO ACID FAST BACILLI SEEN     Performed at [facility]   Culture     Final   Value: CULTURE WILL BE EXAMINED FOR 6 WEEKS BEFORE ISSUING A FINAL REPORT     Performed at [facility]   Report Status PENDING   Incomplete  TISSUE CULTURE     Status: None   Collection Time    03/20/14 12:18 PM      Result Value Ref Range Status   Specimen Description TISSUE   Final   Special Requests PLEURAL PEEL POF ROCEPHIN   Final   Gram Stain     Final   Value: RARE WBC PRESENT, PREDOMINANTLY MONONUCLEAR     NO ORGANISMS SEEN     Performed at [facility]   Culture     Final   Value: NO GROWTH 2 DAYS     Performed at [facility]   Report Status PENDING   Incomplete    Anti-infectives   Start     Dose/Rate Route Frequency Ordered Stop   03/21/14 0900  vancomycin (VANCOCIN) 1,250 mg in sodium chloride 0.9 % 250  mL IVPB     1,250 mg 166.7 mL/hr over 90 Minutes Intravenous Every 8 hours 03/21/14 0850     03/20/14 2000  vancomycin (VANCOCIN) 1,250 mg in sodium chloride 0.9 % 250 mL IVPB  Status:  Discontinued     1,250 mg 166.7 mL/hr over 90 Minutes Intravenous Every 12 hours 03/20/14 1913 03/21/14 0850   03/20/14 1930  piperacillin-tazobactam (ZOSYN) IVPB 3.375 g     3.375 g 12.5 mL/hr over 240 Minutes Intravenous Every 8 hours  03/20/14 1906     03/20/14 1130  cefUROXime (ZINACEF) 1.5 g in dextrose 5 % 50 mL IVPB  Status:  Discontinued     1.5 g 100 mL/hr over 30 Minutes Intravenous To Surgery 03/20/14 1126 03/20/14 1833   03/19/14 1414  cefUROXime (ZINACEF) 1.5 g in dextrose 5 % 50 mL IVPB     1.5 g 100 mL/hr over 30 Minutes Intravenous 60 min pre-op 03/19/14 1414 03/20/14 1140   03/19/14 1000  azithromycin (ZITHROMAX) 500 mg in dextrose 5 % 250 mL IVPB  Status:  Discontinued     500 mg 250 mL/hr over 60 Minutes Intravenous Every 24 hours 03/18/14 1620 03/20/14 1901   03/19/14 0600  vancomycin (VANCOCIN) 1,250 mg in sodium chloride 0.9 % 250 mL IVPB  Status:  Discontinued     1,250 mg 166.7 mL/hr over 90 Minutes Intravenous Every 12 hours 03/18/14 1628 03/20/14 1913   03/18/14 1700  vancomycin (VANCOCIN) 2,000 mg in sodium chloride 0.9 % 500 mL IVPB     2,000 mg 250 mL/hr over 120 Minutes Intravenous  Once 03/18/14 1628 03/18/14 1925   03/18/14 1630  cefTRIAXone (ROCEPHIN) 1 g in dextrose 5 % 50 mL IVPB  Status:  Discontinued     1 g 100 mL/hr over 30 Minutes Intravenous Every 24 hours 03/18/14 1628 03/20/14 1901   03/18/14 0000  cefUROXime (CEFTIN) 250 MG tablet     250 mg Oral 2 times daily with meals 03/18/14 0945     03/18/14 0000  azithromycin (ZITHROMAX) 500 MG tablet        03/18/14 0945     03/17/14 1700  cefUROXime (CEFTIN) tablet 250 mg  Status:  Discontinued     250 mg Oral 2 times daily with meals 03/17/14 1038 03/18/14 1619   03/17/14 1045  azithromycin (ZITHROMAX) tablet 500 mg  Status:  Discontinued     500 mg Oral Daily 03/17/14 1038 03/18/14 1619   03/16/14 0300  vancomycin (VANCOCIN) 1,250 mg in sodium chloride 0.9 % 250 mL IVPB  Status:  Discontinued     1,250 mg 166.7 mL/hr over 90 Minutes Intravenous Every 12 hours 03/15/14 1410 03/17/14 1038   03/15/14 1500  vancomycin (VANCOCIN) 2,000 mg in sodium chloride 0.9 % 500 mL IVPB  Status:  Discontinued     2,000 mg 250 mL/hr over 120  Minutes Intravenous  Once 03/15/14 1410 03/17/14 1154   03/14/14 0300  cefTRIAXone (ROCEPHIN) 1 g in dextrose 5 % 50 mL IVPB  Status:  Discontinued     1 g 100 mL/hr over 30 Minutes Intravenous Daily at bedtime 03/14/14 0247 03/17/14 1038   03/14/14 0015  cefTRIAXone (ROCEPHIN) 1 g in dextrose 5 % 50 mL IVPB  Status:  Discontinued     1 g 100 mL/hr over 30 Minutes Intravenous  Once 03/14/14 0005 03/14/14 0318   03/14/14 0015  azithromycin (ZITHROMAX) 500 mg in dextrose 5 % 250 mL IVPB  Status:  Discontinued  500 mg 250 mL/hr over 60 Minutes Intravenous Every 24 hours 03/14/14 0005 03/17/14 1038      Assessment: Admit Complaint: 53 YOM transferred from [facility] on 10/7, originally being treated with Rocephin and Azithro (10/2-10/6) for CAP, with worsened SOB, chest CT - large loculated L sided effusion suggestive of infection/empyema.   Infectious Disease: Pneumonia, s/p VATS 10/8. Per CVTS will need prolonged IV abx. Afebrile. WBC 12.4 down. Vanco dose missed 10/10 AM.   azithro 10/2 >>10/8 Rocephin 10/2>>10/8 Zosyn 10/8>> Vancomycin 10/4 >> --10/9 VT = 6.6 on 1250mg  q12>>increase to 1250mg  q8 --10/11 VT=17.5 PO zinacef 10/5 >> 10/6  10/3 blood x 2 - Negative 10/4 sputum - neg 10/8 pleural fluid -  10/8 pleural tissue -    Goal of Therapy:  Vancomycin trough level 15-20 mcg/ml  Plan:  Vancomycin 1250mg  IV q8hr Zosyn 3.375g IV q8hr   Yoshiko Keleher S. Merilynn Finland, PharmD, BCPS Clinical Staff Pharmacist Pager [phone]  Misty Stanley Stillinger 03/23/2014,5:28 PM

## 2014-03-24 ENCOUNTER — Inpatient Hospital Stay (HOSPITAL_COMMUNITY): Payer: BC Managed Care – PPO

## 2014-03-24 LAB — TISSUE CULTURE: Culture: NO GROWTH

## 2014-03-24 MED ORDER — DOXYCYCLINE HYCLATE 100 MG PO TABS
100.0000 mg | ORAL_TABLET | Freq: Two times a day (BID) | ORAL | Status: DC
  Administered 2014-03-25: 100 mg via ORAL
  Filled 2014-03-24 (×2): qty 1

## 2014-03-24 MED ORDER — AMOXICILLIN-POT CLAVULANATE 875-125 MG PO TABS
1.0000 | ORAL_TABLET | Freq: Two times a day (BID) | ORAL | Status: DC
  Administered 2014-03-25: 1 via ORAL
  Filled 2014-03-24 (×2): qty 1

## 2014-03-24 NOTE — Discharge Instructions (Signed)
Thoracotomy, Care After °Refer to this sheet in the next few weeks. These instructions provide you with information on caring for yourself after your procedure. Your health care provider may also give you more specific instructions. Your treatment has been planned according to current medical practices, but problems sometimes occur. Call your health care provider if you have any problems or questions after your procedure. °WHAT TO EXPECT AFTER YOUR PROCEDURE °After your procedure, it is typical to have the following sensations: °· You may feel pain at the incision site. °· You may be constipated from the pain medicine given and the change in your level of activity. °· You may feel extremely tired. °HOME CARE INSTRUCTIONS °· Take over-the-counter or prescription medicines for pain, discomfort, or fever only as directed by your health care provider. It is very important to take pain relieving medicine before your pain becomes severe. You will be able to breathe and cough more comfortably if your pain is well controlled. °· Take deep breaths. Deep breathing helps to keep your lungs inflated and protects against a lung infection (pneumonia). °· Cough frequently. Even though coughing may cause discomfort, coughing is important to clear mucus (phlegm) and expand your lungs. Coughing helps prevent pneumonia. If it hurts to cough, hold a pillow against your chest when you cough. This may help with the discomfort. °· Continue to use an incentive spirometer as directed. The use of an incentive spirometer helps to keep your lungs inflated and protects against pneumonia. °· Change the bandages over your incision as needed or as directed by your health care provider. °· Remove the bandages over your chest tube site as directed by your health care provider. °· Resume your normal diet as directed. It is important to have adequate protein, calories, vitamins, and minerals to promote healing. °· Prevent constipation. °¨ Eat  high-fiber foods such as whole grain cereals and breads, brown rice, beans, and fresh fruits and vegetables. °¨ Drink enough water and fluids to keep your urine clear or pale yellow. Avoid drinking beverages containing caffeine. Beverages containing caffeine can cause dehydration and harden your stool. °¨ Talk to your health care provider about taking a stool softener or laxative. °· Avoid lifting until you are instructed otherwise. °· Do not drive until directed by your health care provider.  Do not drive while taking pain medicines (narcotics). °· Do not bathe, swim, or use a hot tub until directed by your health care provider. You may shower instead. Gently wash the area of your incision with water and soap as directed. Do not use anything else to clean your incision except as directed by your health care provider. °· Do not use any tobacco products including cigarettes, chewing tobacco, or electronic cigarettes. °· Avoid secondhand smoke. °· Schedule an appointment for stitch (suture) or staple removal as directed. °· Schedule and attend all follow-up visits as directed by your health care provider. It is important to keep all your appointments. °· Participate in pulmonary rehabilitation as directed by your health care provider. °· Do not travel by airplane for 2 weeks after your chest tube is removed. °SEEK MEDICAL CARE IF: °· You are bleeding from your wounds. °· Your heartbeat seems irregular. °· You have redness, swelling, or increasing pain in the wounds. °· There is pus coming from your wounds. °· There is a bad smell coming from the wound or dressing. °· You have a fever or chills. °· You have nausea or are vomiting. °· You have muscle aches. °SEEK   IMMEDIATE MEDICAL CARE IF: °· You have a rash. °· You have difficulty breathing. °· You have a reaction or side effect to medicines given. °· You have persistent nausea. °· You have lightheadedness or feel faint. °· You have shortness of breath or chest  pain. °· You have persistent pain. °Document Released: 11/12/2010 Document Revised: 06/04/2013 Document Reviewed: 01/16/2013 °[employer]® Patient Information ©2015 [employer]. This information is not intended to replace advice given to you by your health care provider. Make sure you discuss any questions you have with your health care provider. ° °

## 2014-03-24 NOTE — Progress Notes (Signed)
PT received into room 2w35, pt oriented to room and call bell, pt placed on tele, will continue to monitor closely Archie BalboaStein, Annely Sliva G, RN

## 2014-03-24 NOTE — Discharge Summary (Signed)
Physician Discharge Summary  Patient ID: [name] [name] MRN: [id number] DOB/AGE: 52/12/1960 52 y.o.  Admit date: 03/13/2014 Discharge date: 03/25/2014  Admission Diagnoses:  Patient Active Problem List   Diagnosis Date Noted  . Empyema 03/20/2014  . Empyema of right pleural space 03/18/2014  . Obesity 03/17/2014  . CAP (community acquired pneumonia) 03/14/2014  . Hydronephrosis, left 03/14/2014  . Back pain 03/14/2014  . Hypertension   . Sleep apnea   . Hyperlipemia    Discharge Diagnoses:   Patient Active Problem List   Diagnosis Date Noted  . Empyema 03/20/2014  . Empyema of right pleural space 03/18/2014  . Obesity 03/17/2014  . CAP (community acquired pneumonia) 03/14/2014  . Hydronephrosis, left 03/14/2014  . Back pain 03/14/2014  . Hypertension   . Sleep apnea   . Hyperlipemia    Discharged Condition: good  History of Present Illness:   [name] [name] is a 53 yo obese white male who presented to an OSH ED with complaints of pleuritic flank pain.  This was also associated with dyspnea, poor appetite, malaise and dry cough.  CXR and subsequent CT scan showed the patient had pneumonia with a loculated pleural effusion consistent with empyema.  He was started on IV ABX and it was felt surgical intervention would be beneficial.     Hospital Course:   He was [facility] care.  He was evaluated by Dr. Donata ClayVan Trigt on 03/19/2014 at which time it was felt he would require a right VATS with decortication of empyema with long term IV ABX.  The risks and benefits of the procedure were explained to the patient and he was agreeable to proceed.  The patient was taken to the operating room and underwent Right Thoracotomy with drainage of right pleural effusion and decortication of loculated empyema.  He tolerated the procedure well, was extubated and taken to the PACU in stable condition.  The patient was started on Empiric Vancomycin and Zosyn for pneumonia.  The  patient progressed well since surgery.  His chest tubes did not exhibit an air leak and had minimal output post operatively.  His anterior chest tube was removed POD #2.  CXR continued to improve and his posterior chest tube was removed on POD #3.  The patient developed productive cough and sputum culture was obtained.  This showed normal flora to be present.  Operating room cultures came back negative for bacteria as well.  The patient continues to progress.  His last chest tube was removed on POD #4.  He is ambulating around the unit.  His pain is well controlled with use of oral medications.  He is tolerating a regular diet.  We will transition his IV antibiotics to an oral regimen of Augmentin and Doxycycline.  CXR was obtained and shows a tiny apical pneumothorax on the right.  The patient is asymptomatic from this.  His incision sutures and staples will be left in place.  He will follow up at [facility] to have a nurse remove these on 04/03/2014.  He is ambulating without difficulty.  He is tolerating a regular diet.  He is felt medically stable for discharge today 03/25/2014.   Significant Diagnostic Studies: Pathology  1. Pleura, peel - DENSELY INFLAMED FIBROADIPOSE TISSUE WITH ASSOCIATED FIBRINOPURULENT MATERIAL. - THERE IS NO EVIDENCE OF MALIGNANCY. 2. Pleura, peel - DENSELY INFLAMED FIBROADIPOSE TISSUE WITH ASSOCIATED FIBRINOPURULENT MATERIAL. - THERE IS NO EVIDENCE OF MALIGNANCY.  Treatments: antibiotics: vancomycin and Zosyn and surgery:   Right  VATS (video-assisted thoracoscopic surgery) with mini thoracotomy and drainage of empyema, decortication of right lung.  Disposition: Home  Discharge Medications:     Medication List         amoxicillin-clavulanate 875-125 MG per tablet  Commonly known as:  AUGMENTIN  Take 1 tablet by mouth every 12 (twelve) hours. For 7 Days     atorvastatin 40 MG tablet  Commonly known as:  LIPITOR  Take 40 mg by mouth daily.     doxycycline 100 MG  tablet  Commonly known as:  VIBRA-TABS  Take 1 tablet (100 mg total) by mouth every 12 (twelve) hours. For 7 Days     Fluticasone-Salmeterol 250-50 MCG/DOSE Aepb  Commonly known as:  ADVAIR  Inhale 1 puff into the lungs 2 (two) times daily.     lisinopril 10 MG tablet  Commonly known as:  PRINIVIL,ZESTRIL  Take 10 mg by mouth daily.     mometasone 50 MCG/ACT nasal spray  Commonly known as:  NASONEX  Place 2 sprays into the nose daily.     montelukast 10 MG tablet  Commonly known as:  SINGULAIR  Take 10 mg by mouth at bedtime.     omeprazole 20 MG capsule  Commonly known as:  PRILOSEC  Take 20 mg by mouth daily.     oxyCODONE 5 MG immediate release tablet  Commonly known as:  Oxy IR/ROXICODONE  Take 1-2 tablets (5-10 mg total) by mouth every 4 (four) hours as needed for severe pain.     traMADol 50 MG tablet  Commonly known as:  ULTRAM  Take 1-2 tablets (50-100 mg total) by mouth every 6 (six) hours as needed (mild pain).     zolpidem 10 MG tablet  Commonly known as:  AMBIEN  Take 10 mg by mouth at bedtime as needed for sleep.         Discharge Instructions   Diet - low sodium heart healthy    Complete by:  As directed      Discharge instructions    Complete by:  As directed   Take medication as directed Follow up with Denton MeekAnn Knight NP at PCP office 03/21/14 at 11am. Follow resolution of pneumonia, hypoxia. Recommend repeat chest xray     Increase activity slowly    Complete by:  As directed           Follow-up Information   Follow up with Denton MeekAnn Knight NP at Dr Rema Fendtavidsons office as he is out of town this week On 03/21/2014. (appointment with Denton MeekAnn Knight at 11am. recommend follow up chest xray to evaluate resolution of pneumonia as well as oxygen saturation level monitoring to evaluate continued need for oxygen. )       Follow up with VAN Dinah BeersRIGT III,PETER, MD On 04/16/2014. (Appointment is at 4:00)    Specialty:  Cardiothoracic Surgery   Contact information:   [address] [place] [address] [phone]       Follow up with [facility] On 04/16/2014. (Please get CXR at 3:00, located on first floor of [facility])    Contact information:   [place]       Follow up with [facility] On 04/03/2014. (Appointment is at 10:00 for staple and suture removal)       Signed: Ruslan Mccabe 03/25/2014, 8:30 AM

## 2014-03-24 NOTE — Progress Notes (Addendum)
      [address]       [place] [id number]             [phone]      4 Days Post-Op Procedure(s) (LRB): VIDEO ASSISTED THORACOSCOPY (VATS)/DECORTICATION (Right)  Subjective:  Mr. Margo AyeHall has no complaints.  States he feels great.  + ambulation + BM  Objective: Vital signs in last 24 hours: Temp:  [97.9 F (36.6 C)-98.8 F (37.1 C)] 97.9 F (36.6 C) (10/12 0736) Pulse Rate:  [61-74] 63 (10/12 0000) Cardiac Rhythm:  [-] Sinus bradycardia (10/11 2000) Resp:  [13-24] 14 (10/12 0354) BP: (106-148)/(57-82) 148/73 mmHg (10/12 0400) SpO2:  [93 %-97 %] 95 % (10/12 0354)  Intake/Output from previous day: 10/11 0701 - 10/12 0700 In: 2567.7 [P.O.:1450; I.V.:230.2; IV Piggyback:887.5] Out: 1027 [[id number] [Urine:1477; Stool:1]  General appearance: alert, cooperative and no distress Heart: regular rate and rhythm Lungs: clear to auscultation bilaterally Abdomen: soft, non-tender; bowel sounds normal; no masses,  no organomegaly Extremities: extremities normal, atraumatic, no cyanosis or edema Wound: clean andd ry  Lab Results:  Recent Labs  03/22/14 0500 03/23/14 0342  WBC 14.3* 12.4*  HGB 10.3* 10.8*  HCT 30.8* 32.9*  PLT 378 467*   BMET:  Recent Labs  03/22/14 0500 03/23/14 0342  NA 141 141  K 4.6 4.4  CL 104 101  CO2 28 24  GLUCOSE 107* 102*  BUN 15 16  CREATININE 1.12 1.11  CALCIUM 8.1* 8.2*    PT/INR: No results found for this basename: LABPROT, INR,  in the last 72 hours ABG    Component Value Date/Time   PHART 7.402 03/20/2014 1515   HCO3 25.1* 03/20/2014 1515   TCO2 26.3 03/20/2014 1515   O2SAT 94.6 03/20/2014 1515   CBG (last 3)   Recent Labs  03/22/14 0014 03/22/14 0623 03/22/14 1211  GLUCAP 130* 104* 142*    Assessment/Plan: S/P Procedure(s) (LRB): VIDEO ASSISTED THORACOSCOPY (VATS)/DECORTICATION (Right)  1. CV- hemodynamically stable- on home dose of Lisinopril, Lipitor 2. Chest tube- no air leak appreciated, minimal output, CXR  stable- will d/c final chest tube today 3. Renal- creatinine mildly elevated at 1.11 will d/c Toradol, continue diuresis for now 4. ID- Pneumonia on Vanc, Zosyn, sputum culture with normal flora present, will transition to oral regimen at discharge 5. D/C PCA, IV fluids 6. Dispo- patient stable, will d/c final chest tube, awaiting bed for 2W, likely d/c home tomorrow   LOS: 11 days    BARRETT, ERIN 03/24/2014  patient examined and medical record reviewed,agree with above note. VAN TRIGT III,PETER 03/24/2014

## 2014-03-25 ENCOUNTER — Inpatient Hospital Stay (HOSPITAL_COMMUNITY): Payer: BC Managed Care – PPO

## 2014-03-25 MED ORDER — TRAMADOL HCL 50 MG PO TABS: 50.0000 mg | ORAL_TABLET | Freq: Four times a day (QID) | ORAL | Status: DC | PRN

## 2014-03-25 MED ORDER — INFLUENZA VAC SPLIT QUAD 0.5 ML IM SUSY: 0.5000 mL | PREFILLED_SYRINGE | INTRAMUSCULAR | Status: DC

## 2014-03-25 MED ORDER — DOXYCYCLINE HYCLATE 100 MG PO TABS: 100.0000 mg | ORAL_TABLET | Freq: Two times a day (BID) | ORAL | Status: DC

## 2014-03-25 MED ORDER — AMOXICILLIN-POT CLAVULANATE 875-125 MG PO TABS: 1.0000 | ORAL_TABLET | Freq: Two times a day (BID) | ORAL | Status: DC

## 2014-03-25 MED ORDER — OXYCODONE HCL 5 MG PO TABS: 5.0000 mg | ORAL_TABLET | ORAL | Status: DC | PRN

## 2014-03-25 MED ORDER — VANCOMYCIN HCL 10 G IV SOLR
1250.0000 mg | Freq: Once | INTRAVENOUS | Status: AC
  Administered 2014-03-25: 1250 mg via INTRAVENOUS
  Filled 2014-03-25: qty 1250

## 2014-03-25 MED ORDER — INFLUENZA VAC SPLIT QUAD 0.5 ML IM SUSY
0.5000 mL | PREFILLED_SYRINGE | Freq: Once | INTRAMUSCULAR | Status: AC
  Administered 2014-03-25: 0.5 mL via INTRAMUSCULAR
  Filled 2014-03-25: qty 0.5

## 2014-03-25 NOTE — Progress Notes (Signed)
03/25/2014 12:27 PM Nursing note RIJ d/c per order and per protocol. Occlusive dressing applied along with direct pressure for 10 minutes post removal. Pt. Tolerated well. Advised BR x 30 min. Will continue to closely monitor patient.  Genecis Veley, Blanchard KelchStephanie Ingold

## 2014-03-25 NOTE — Progress Notes (Signed)
03/25/2014 2:03 PM Nursing note Discharge avs form, medications already taken today and those due this evening given and explained to patient and family. Incision site care, follow up appointments and when to call MD reviewed. RIJ d/c earlier in shift. D/c tele. Staples and CTS left in place per verbal order Lowella Dandyrin Barrett PAC. D/c home with wife per orders.  [name] [name], Blanchard KelchStephanie Ingold

## 2014-03-25 NOTE — Discharge Summary (Signed)
patient examined and medical record reviewed,agree with above note. [name],[name] [name] 03/25/2014   

## 2014-04-03 ENCOUNTER — Ambulatory Visit (INDEPENDENT_AMBULATORY_CARE_PROVIDER_SITE_OTHER): Payer: Self-pay

## 2014-04-03 ENCOUNTER — Other Ambulatory Visit: Payer: Self-pay | Admitting: *Deleted

## 2014-04-03 DIAGNOSIS — G8918 Other acute postprocedural pain: Secondary | ICD-10-CM

## 2014-04-03 DIAGNOSIS — J869 Pyothorax without fistula: Secondary | ICD-10-CM

## 2014-04-03 DIAGNOSIS — Z4802 Encounter for removal of sutures: Secondary | ICD-10-CM

## 2014-04-03 MED ORDER — OXYCODONE HCL 5 MG PO TABS: 5.0000 mg | ORAL_TABLET | ORAL | Status: DC | PRN

## 2014-04-03 NOTE — Progress Notes (Signed)
Pt was in clinic today to have staples / sutures removed.Pt's skin is healing well no draining mild pain to be expected.Pt tolerated removal well.

## 2014-04-15 ENCOUNTER — Other Ambulatory Visit: Payer: Self-pay | Admitting: Cardiothoracic Surgery

## 2014-04-15 DIAGNOSIS — J869 Pyothorax without fistula: Secondary | ICD-10-CM

## 2014-04-16 ENCOUNTER — Ambulatory Visit
Admission: RE | Admit: 2014-04-16 | Discharge: 2014-04-16 | Disposition: A | Discharge status: Home / Self Care | Payer: BC Managed Care – PPO | Source: Ambulatory Visit | Attending: Cardiothoracic Surgery | Admitting: Cardiothoracic Surgery

## 2014-04-16 ENCOUNTER — Ambulatory Visit (INDEPENDENT_AMBULATORY_CARE_PROVIDER_SITE_OTHER): Payer: Self-pay | Admitting: Cardiothoracic Surgery

## 2014-04-16 ENCOUNTER — Encounter: Payer: Self-pay | Admitting: Cardiothoracic Surgery

## 2014-04-16 VITALS — BP 144/87 | HR 88 | Ht 69.0 in | Wt 287.0 lb

## 2014-04-16 DIAGNOSIS — J869 Pyothorax without fistula: Secondary | ICD-10-CM

## 2014-04-16 DIAGNOSIS — Z9889 Other specified postprocedural states: Secondary | ICD-10-CM

## 2014-04-16 LAB — FUNGUS CULTURE W SMEAR: Fungal Smear: NONE SEEN

## 2014-04-16 NOTE — Progress Notes (Signed)
PCP is No primary care provider on file. Referring Provider is Ozella RocksMerrell, David J, MD  Chief Complaint  Patient presents with  . F/U THORACIC    3 WK F/U VISIT    HPI:one month followup after right VATS with drainage and decortication of empyema. Patient doing well at home with mild postthoracotomy pain. Patient finished a course of oral antibiotics. Surgical incision healing well. No recurrent productive cough fever or shortness of breath. Chest x-ray today shows clear lung fields with mild pleural scarring at right base   Past Medical History  Diagnosis Date  . Hypertension   . Kidney stone   . Sleep apnea   . Asthma   . Seasonal allergies   . Hyperlipemia   . Psoriasis   . Immune deficiency disorder   . Obesity     BMI 42.6    Past Surgical History  Procedure Laterality Date  . Nose surgery    . Video assisted thoracoscopy (vats)/decortication Right 03/20/2014    Procedure: VIDEO ASSISTED THORACOSCOPY (VATS)/DECORTICATION;  Surgeon: Kerin PernaPeter Van Trigt, MD;  [facility];  Service: Thoracic;  Laterality: Right;    No family history on file.  Social History History  Substance Use Topics  . Smoking status: Never Smoker   . Smokeless tobacco: Not on file  . Alcohol Use: No    Current Outpatient Prescriptions  Medication Sig Dispense Refill  . atorvastatin (LIPITOR) 40 MG tablet Take 40 mg by mouth daily.    . Fluticasone-Salmeterol (ADVAIR) 250-50 MCG/DOSE AEPB Inhale 1 puff into the lungs 2 (two) times daily.    Marland Kitchen. lisinopril (PRINIVIL,ZESTRIL) 10 MG tablet Take 10 mg by mouth daily.    . mometasone (NASONEX) 50 MCG/ACT nasal spray Place 2 sprays into the nose daily.    . montelukast (SINGULAIR) 10 MG tablet Take 10 mg by mouth at bedtime.    Marland Kitchen. omeprazole (PRILOSEC) 20 MG capsule Take 20 mg by mouth daily.    Marland Kitchen. zolpidem (AMBIEN) 10 MG tablet Take 10 mg by mouth at bedtime as needed for sleep.    Marland Kitchen. oxyCODONE (OXY IR/ROXICODONE) 5 MG immediate release tablet Take 1-2  tablets (5-10 mg total) by mouth every 4 (four) hours as needed for severe pain. 40 tablet 0  . traMADol (ULTRAM) 50 MG tablet Take 1-2 tablets (50-100 mg total) by mouth every 6 (six) hours as needed (mild pain). 30 tablet 0   No current facility-administered medications for this visit.    No Known Allergies  Review of Systemsimproved appetite and strength overall doing quite well taking minimal narcotics for pain-- usually at night  BP 144/87 mmHg  Pulse 88  Ht 5\' 9"  (1.753 m)  Wt 287 lb (130.182 kg)  BMI 42.36 kg/m2  SpO2 98% Physical Exam Alert and comfortable Lungs clear Heart rhythm regular Right VATS incision well-healed No edema Neuro intact  Diagnostic Tests: Chest x-ray clear  Impression: Resolved right empyema  Plan:continue rehabilitation, ambulation and improved nutrition. Patient may drive lift up to 20 pounds. Patient return in 4-5 weeks for review and to discuss return to work at the [employer]

## 2014-05-02 LAB — AFB CULTURE WITH SMEAR (NOT AT ARMC): ACID FAST SMEAR: NONE SEEN

## 2014-05-13 ENCOUNTER — Other Ambulatory Visit: Payer: Self-pay | Admitting: Cardiothoracic Surgery

## 2014-05-13 DIAGNOSIS — J869 Pyothorax without fistula: Secondary | ICD-10-CM

## 2014-05-14 ENCOUNTER — Ambulatory Visit (INDEPENDENT_AMBULATORY_CARE_PROVIDER_SITE_OTHER): Payer: Self-pay | Admitting: Cardiothoracic Surgery

## 2014-05-14 ENCOUNTER — Ambulatory Visit
Admission: RE | Admit: 2014-05-14 | Discharge: 2014-05-14 | Disposition: A | Discharge status: Home / Self Care | Payer: BC Managed Care – PPO | Source: Ambulatory Visit | Attending: Cardiothoracic Surgery | Admitting: Cardiothoracic Surgery

## 2014-05-14 ENCOUNTER — Encounter: Payer: Self-pay | Admitting: Cardiothoracic Surgery

## 2014-05-14 VITALS — BP 136/92 | HR 76 | Resp 16 | Ht 69.0 in | Wt 287.0 lb

## 2014-05-14 DIAGNOSIS — Z9889 Other specified postprocedural states: Secondary | ICD-10-CM

## 2014-05-14 DIAGNOSIS — J869 Pyothorax without fistula: Secondary | ICD-10-CM

## 2014-05-14 NOTE — Progress Notes (Signed)
PCP is No primary care provider on file. Referring Provider is Ozella RocksMerrell, David J, MD  No chief complaint on file.   HPI:2 month followup after right VATS for drainage and decortication of large empyema Patient doing well without evidence recurrent disease. The patient is being treated with for a sinus infection with amoxicillin. The patient has persistent right subcostal neurogenic postthoracotomy pain  Past Medical History  Diagnosis Date  . Hypertension   . Kidney stone   . Sleep apnea   . Asthma   . Seasonal allergies   . Hyperlipemia   . Psoriasis   . Immune deficiency disorder   . Obesity     BMI 42.6    Past Surgical History  Procedure Laterality Date  . Nose surgery    . Video assisted thoracoscopy (vats)/decortication Right 03/20/2014    Procedure: VIDEO ASSISTED THORACOSCOPY (VATS)/DECORTICATION;  Surgeon: Kerin PernaPeter Van Trigt, MD;  [facility];  Service: Thoracic;  Laterality: Right;    No family history on file.  Social History History  Substance Use Topics  . Smoking status: Never Smoker   . Smokeless tobacco: Not on file  . Alcohol Use: No    Current Outpatient Prescriptions  Medication Sig Dispense Refill  . amoxicillin (AMOXIL) 500 MG capsule Take 500 mg by mouth 3 (three) times daily. Started 05/06/14    . atorvastatin (LIPITOR) 40 MG tablet Take 40 mg by mouth daily.    . Fluticasone-Salmeterol (ADVAIR) 250-50 MCG/DOSE AEPB Inhale 1 puff into the lungs 2 (two) times daily.    Marland Kitchen. HYDROcodone-acetaminophen (NORCO/VICODIN) 5-325 MG per tablet Take 1 tablet by mouth every 6 (six) hours as needed for moderate pain.    Marland Kitchen. lisinopril (PRINIVIL,ZESTRIL) 10 MG tablet Take 10 mg by mouth daily.    . mometasone (NASONEX) 50 MCG/ACT nasal spray Place 2 sprays into the nose daily.    . montelukast (SINGULAIR) 10 MG tablet Take 10 mg by mouth at bedtime.    Marland Kitchen. omeprazole (PRILOSEC) 20 MG capsule Take 20 mg by mouth daily.    Marland Kitchen. zolpidem (AMBIEN) 10 MG tablet Take 10 mg  by mouth at bedtime as needed for sleep.     No current facility-administered medications for this visit.    No Known Allergies  Review of Systemsimproved appetite and strength and activity. Pain is worse at night  BP 136/92 mmHg  Pulse 76  Resp 16  Ht 5\' 9"  (1.753 m)  Wt 287 lb (130.182 kg)  BMI 42.36 kg/m2  SpO2 98% Physical Exam Alert and comfortable Lungs clear Surgical incision on right chest healed Heart rate regular  Diagnostic Tests: Chest x-ray is clear  Impression: Resolved right empyema Persistent right postthoracotomy pain We'll start Neurontin 200 mg twice a day for neurogenic pain and give one more course of oral Vicodin to help him rest at night  Plan:return in 2 months for final review . Return  to work note provided for early January

## 2014-07-14 ENCOUNTER — Other Ambulatory Visit: Payer: Self-pay | Admitting: Cardiothoracic Surgery

## 2014-07-14 DIAGNOSIS — J869 Pyothorax without fistula: Secondary | ICD-10-CM

## 2014-07-16 ENCOUNTER — Ambulatory Visit: Payer: BC Managed Care – PPO | Admitting: Cardiothoracic Surgery

## 2015-03-17 ENCOUNTER — Emergency Department (HOSPITAL_COMMUNITY): Payer: BLUE CROSS/BLUE SHIELD

## 2015-03-17 ENCOUNTER — Emergency Department (HOSPITAL_COMMUNITY)
Admission: EM | Admit: 2015-03-17 | Discharge: 2015-03-17 | Disposition: A | Discharge status: Home / Self Care | Payer: BLUE CROSS/BLUE SHIELD | Attending: Emergency Medicine | Admitting: Emergency Medicine

## 2015-03-17 ENCOUNTER — Encounter (HOSPITAL_COMMUNITY): Payer: Self-pay | Admitting: Emergency Medicine

## 2015-03-17 DIAGNOSIS — Z872 Personal history of diseases of the skin and subcutaneous tissue: Secondary | ICD-10-CM | POA: Insufficient documentation

## 2015-03-17 DIAGNOSIS — E669 Obesity, unspecified: Secondary | ICD-10-CM | POA: Diagnosis not present

## 2015-03-17 DIAGNOSIS — J45901 Unspecified asthma with (acute) exacerbation: Secondary | ICD-10-CM | POA: Diagnosis not present

## 2015-03-17 DIAGNOSIS — I10 Essential (primary) hypertension: Secondary | ICD-10-CM | POA: Insufficient documentation

## 2015-03-17 DIAGNOSIS — Z8669 Personal history of other diseases of the nervous system and sense organs: Secondary | ICD-10-CM | POA: Insufficient documentation

## 2015-03-17 DIAGNOSIS — Z87442 Personal history of urinary calculi: Secondary | ICD-10-CM | POA: Insufficient documentation

## 2015-03-17 DIAGNOSIS — R05 Cough: Secondary | ICD-10-CM | POA: Diagnosis not present

## 2015-03-17 DIAGNOSIS — R0789 Other chest pain: Secondary | ICD-10-CM | POA: Insufficient documentation

## 2015-03-17 DIAGNOSIS — E785 Hyperlipidemia, unspecified: Secondary | ICD-10-CM | POA: Diagnosis not present

## 2015-03-17 DIAGNOSIS — Z7951 Long term (current) use of inhaled steroids: Secondary | ICD-10-CM | POA: Diagnosis not present

## 2015-03-17 DIAGNOSIS — R079 Chest pain, unspecified: Secondary | ICD-10-CM | POA: Diagnosis present

## 2015-03-17 DIAGNOSIS — K219 Gastro-esophageal reflux disease without esophagitis: Secondary | ICD-10-CM | POA: Insufficient documentation

## 2015-03-17 DIAGNOSIS — Z79899 Other long term (current) drug therapy: Secondary | ICD-10-CM | POA: Insufficient documentation

## 2015-03-17 LAB — CBC WITH DIFFERENTIAL/PLATELET
BASOS PCT: 1 %
Basophils Absolute: 0.1 10*3/uL (ref 0.0–0.1)
EOS ABS: 0.4 10*3/uL (ref 0.0–0.7)
Eosinophils Relative: 4 %
HEMATOCRIT: 39.3 % (ref 39.0–52.0)
HEMOGLOBIN: 13.1 g/dL (ref 13.0–17.0)
LYMPHS ABS: 1.8 10*3/uL (ref 0.7–4.0)
Lymphocytes Relative: 17 %
MCH: 29.2 pg (ref 26.0–34.0)
MCHC: 33.3 g/dL (ref 30.0–36.0)
MCV: 87.7 fL (ref 78.0–100.0)
Monocytes Absolute: 0.8 10*3/uL (ref 0.1–1.0)
Monocytes Relative: 8 %
NEUTROS PCT: 70 %
Neutro Abs: 7.6 10*3/uL (ref 1.7–7.7)
Platelets: 274 10*3/uL (ref 150–400)
RBC: 4.48 MIL/uL (ref 4.22–5.81)
RDW: 16.3 % — ABNORMAL HIGH (ref 11.5–15.5)
WBC: 10.7 10*3/uL — AB (ref 4.0–10.5)

## 2015-03-17 LAB — COMPREHENSIVE METABOLIC PANEL
ALBUMIN: 3.4 g/dL — AB (ref 3.5–5.0)
ALT: 25 U/L (ref 17–63)
ANION GAP: 7 (ref 5–15)
AST: 25 U/L (ref 15–41)
Alkaline Phosphatase: 105 U/L (ref 38–126)
BUN: 19 mg/dL (ref 6–20)
CO2: 26 mmol/L (ref 22–32)
Calcium: 8.3 mg/dL — ABNORMAL LOW (ref 8.9–10.3)
Chloride: 106 mmol/L (ref 101–111)
Creatinine, Ser: 1.07 mg/dL (ref 0.61–1.24)
GFR calc Af Amer: >60 mL/min (ref >60)
GFR calc non Af Amer: >60 mL/min (ref >60)
GLUCOSE: 156 mg/dL — AB (ref 65–99)
Potassium: 4 mmol/L (ref 3.5–5.1)
SODIUM: 139 mmol/L (ref 135–145)
TOTAL PROTEIN: 6.6 g/dL (ref 6.5–8.1)
Total Bilirubin: 0.5 mg/dL (ref 0.3–1.2)

## 2015-03-17 LAB — TROPONIN I: Troponin I: <0.03 ng/mL (ref <0.031)

## 2015-03-17 MED ORDER — NITROGLYCERIN 2 % TD OINT
1.0000 [in_us] | TOPICAL_OINTMENT | Freq: Once | TRANSDERMAL | Status: AC
  Administered 2015-03-17: 1 [in_us] via TOPICAL
  Filled 2015-03-17: qty 1

## 2015-03-17 MED ORDER — ASPIRIN 81 MG PO CHEW
81.0000 mg | CHEWABLE_TABLET | Freq: Once | ORAL | Status: AC
  Administered 2015-03-17: 81 mg via ORAL
  Filled 2015-03-17: qty 1

## 2015-03-17 MED ORDER — FAMOTIDINE IN NACL 20-0.9 MG/50ML-% IV SOLN
20.0000 mg | Freq: Once | INTRAVENOUS | Status: AC
  Administered 2015-03-17: 20 mg via INTRAVENOUS
  Filled 2015-03-17: qty 50

## 2015-03-17 MED ORDER — IPRATROPIUM-ALBUTEROL 0.5-2.5 (3) MG/3ML IN SOLN
RESPIRATORY_TRACT | Status: AC
  Administered 2015-03-17: 3 mL
  Filled 2015-03-17: qty 3

## 2015-03-17 MED ORDER — IPRATROPIUM BROMIDE 0.02 % IN SOLN: 0.5000 mg | Freq: Once | RESPIRATORY_TRACT | Status: DC

## 2015-03-17 MED ORDER — ACETAMINOPHEN 500 MG PO TABS
1000.0000 mg | ORAL_TABLET | Freq: Once | ORAL | Status: AC
  Administered 2015-03-17: 1000 mg via ORAL
  Filled 2015-03-17: qty 2

## 2015-03-17 MED ORDER — GI COCKTAIL ~~LOC~~
30.0000 mL | Freq: Once | ORAL | Status: AC
  Administered 2015-03-17: 30 mL via ORAL
  Filled 2015-03-17: qty 30

## 2015-03-17 MED ORDER — ALBUTEROL SULFATE (2.5 MG/3ML) 0.083% IN NEBU: 5.0000 mg | INHALATION_SOLUTION | Freq: Once | RESPIRATORY_TRACT | Status: DC

## 2015-03-17 MED ORDER — ALBUTEROL SULFATE (2.5 MG/3ML) 0.083% IN NEBU
INHALATION_SOLUTION | RESPIRATORY_TRACT | Status: AC
  Administered 2015-03-17: 2.5 mg
  Filled 2015-03-17: qty 3

## 2015-03-17 NOTE — ED Notes (Signed)
Patient/family informed that MD wanted to speak with patient prior to discharge.

## 2015-03-17 NOTE — ED Notes (Signed)
Patient with no complaints at this time. Respirations even and unlabored. Skin warm/dry. Discharge instructions reviewed with patient at this time. Patient given opportunity to voice concerns/ask questions. Patient discharged at this time and left Emergency Department with steady gait.   

## 2015-03-17 NOTE — Discharge Instructions (Signed)
If you were given medicines take as directed.  If you are on coumadin or contraceptives realize their levels and effectiveness is altered by many different medicines.  If you have any reaction (rash, tongues swelling, other) to the medicines stop taking and see a physician.    If your blood pressure was elevated in the ER make sure you follow up for management with a primary doctor or return for chest pain, shortness of breath or stroke symptoms.  Please follow up as directed and return to the ER or see a physician for new or worsening symptoms.  Thank you. Filed Vitals:   03/17/15 0530 03/17/15 0556 03/17/15 0600 03/17/15 0716  BP: 132/67  112/47 109/60  Pulse: 64  55 70  Temp:      Resp: Height:      Weight:      SpO2: 98% 100% 100% 97%

## 2015-03-17 NOTE — ED Provider Notes (Signed)
Patient signed out to myself this morning with plan to follow-up delta troponin and if negative outpatient follow-up. Patient improved in the ER, vital signs normal, clinical concern for reflux, low risk cardiac. Troponin results reviewed negative. Patient stable for outpatient follow-up.   Gabryela Kimbrell, Quincy Simmonds, MD 03/17/15 [phone]

## 2015-03-17 NOTE — ED Notes (Signed)
EKG done and given to EDP. Patient on cardiac monitor.

## 2015-03-17 NOTE — ED Provider Notes (Signed)
CSN: [id number]     Arrival date & time 03/17/15  0520 History   First MD Initiated Contact with Patient 03/17/15 [phone]    Chief Complaint  Patient presents with  . Chest Pain     (Consider location/radiation/quality/duration/timing/severity/associated sxs/prior Treatment) HPI [name] [name] is a 54 year old male with a history of hypertension and high cholesterol. Patient states about an hour and a half ago he woke up with burning fluid in his throat and then he started having anterior chest pain that he states feels like a weight on his chest with shortness of breath and the pain radiating into his left arm. He took 3 aspirin 81 mg tablets about 45 minutes ago. He states he's never had this pain before. He states since he's gotten to the ER his chest is now also starting to have burning sensation and he is getting the burning fluid in his throat again. He also took a Tums after the pain started without relief. He took his usual bedtime omeprazole this evening. He reports a cough that started after he got the pain today. He states his mother is alive at age 39 and she has a history of atrial fib, his father's alive at age 27 and has had MI. He reports his pain at its worst was an 8 out of 10 and currently it still an 8 out of 10.  His wife is concerned because he was admitted about a year ago with pneumonia and that turned into an empyema and he had to have a VATS procedure done.  PCP Dr Ignacia Palma in [place], [place]  Past Medical History  Diagnosis Date  . Hypertension   . Kidney stone   . Sleep apnea   . Asthma   . Seasonal allergies   . Hyperlipemia   . Psoriasis   . Immune deficiency disorder (HCC)   . Obesity     BMI 42.6   Past Surgical History  Procedure Laterality Date  . Nose surgery    . Video assisted thoracoscopy (vats)/decortication Right 03/20/2014    Procedure: VIDEO ASSISTED THORACOSCOPY (VATS)/DECORTICATION;  Surgeon: Kerin Perna, MD;  Location: [facility] OR;  Service: Thoracic;   Laterality: Right;   History reviewed. No pertinent family history. Social History  Substance Use Topics  . Smoking status: Never Smoker   . Smokeless tobacco: None  . Alcohol Use: No  employed Lives at home Lives with spouse  Review of Systems  All other systems reviewed and are negative.     Allergies  Review of patient's allergies indicates no known allergies.  Home Medications   Prior to Admission medications   Medication Sig Start Date End Date Taking? Authorizing Provider  atorvastatin (LIPITOR) 40 MG tablet Take 40 mg by mouth daily.   Yes Historical Provider, MD  Fluticasone-Salmeterol (ADVAIR) 250-50 MCG/DOSE AEPB Inhale 1 puff into the lungs 2 (two) times daily.   Yes Historical Provider, MD  HYDROcodone-acetaminophen (NORCO/VICODIN) 5-325 MG per tablet Take 1 tablet by mouth every 6 (six) hours as needed for moderate pain.   Yes Historical Provider, MD  lisinopril (PRINIVIL,ZESTRIL) 10 MG tablet Take 10 mg by mouth daily.   Yes Historical Provider, MD  mometasone (NASONEX) 50 MCG/ACT nasal spray Place 2 sprays into the nose daily.   Yes Historical Provider, MD  montelukast (SINGULAIR) 10 MG tablet Take 10 mg by mouth at bedtime.   Yes Historical Provider, MD  omeprazole (PRILOSEC) 20 MG capsule Take 20 mg by mouth daily.   Yes Historical  Provider, MD  zolpidem (AMBIEN) 10 MG tablet Take 10 mg by mouth at bedtime as needed for sleep.   Yes Historical Provider, MD  amoxicillin (AMOXIL) 500 MG capsule Take 500 mg by mouth 3 (three) times daily. Started 05/06/14    Historical Provider, MD   BP 134/72 mmHg  Pulse 60  Temp(Src) 97.7 F (36.5 C)  Resp 22  Ht  (1.753 m)  Wt 310 lb (140.615 kg)  BMI 45.76 kg/m2  SpO2 97%  Vital signs normal   Physical Exam  Constitutional: He is oriented to person, place, and time. He appears well-developed and well-nourished.  Non-toxic appearance. He does not appear ill. No distress.  obese  HENT:  Head: Normocephalic and  atraumatic.  Right Ear: External ear normal.  Left Ear: External ear normal.  Nose: Nose normal. No mucosal edema or rhinorrhea.  Mouth/Throat: Oropharynx is clear and moist and mucous membranes are normal. No dental abscesses or uvula swelling.  Eyes: Conjunctivae and EOM are normal. Pupils are equal, round, and reactive to light.  Neck: Normal range of motion and full passive range of motion without pain. Neck supple.  Cardiovascular: Normal rate, regular rhythm and normal heart sounds.  Exam reveals no gallop and no friction rub.   No murmur heard. Pulmonary/Chest: Effort normal. No respiratory distress. He has wheezes. He has no rhonchi. He has no rales. He exhibits no tenderness and no crepitus.  Heart tones distant due to thickness of chest wall. He has a few faint short end expiratory wheezes  Abdominal: Soft. Normal appearance and bowel sounds are normal. He exhibits no distension. There is no tenderness. There is no rebound and no guarding.  Musculoskeletal: Normal range of motion. He exhibits no edema or tenderness.  Moves all extremities well.   Neurological: He is alert and oriented to person, place, and time. He has normal strength. No cranial nerve deficit.  Skin: Skin is warm, dry and intact. No rash noted. No erythema. No pallor.  Psychiatric: He has a normal mood and affect. His speech is normal and behavior is normal. His mood appears not anxious.  Nursing note and vitals reviewed.   ED Course  Procedures (including critical care time)  Medications  aspirin chewable tablet 81 mg (81 mg Oral Given 03/17/15 0547)  famotidine (PEPCID) IVPB 20 mg premix (0 mg Intravenous Stopped 03/17/15 0633)  gi cocktail (Maalox,Lidocaine,Donnatal) (30 mLs Oral Given 03/17/15 0548)  nitroGLYCERIN (NITROGLYN) 2 % ointment 1 inch (1 inch Topical Given 03/17/15 0549)  acetaminophen (TYLENOL) tablet 1,000 mg (1,000 mg Oral Given 03/17/15 0547)  ipratropium-albuterol (DUONEB) 0.5-2.5 (3) MG/3ML  nebulizer solution (3 mLs  Given 03/17/15 0555)  albuterol (PROVENTIL) (2.5 MG/3ML) 0.083% nebulizer solution (2.5 mg  Given 03/17/15 0555)    Recheck at 6:30 AM patient states his pain is much better. He states his breathing and cough is better after the nebulizer. We discussed his lab results and chest x-ray results. We are going to do a delta troponin at 8 AM.  Patient left with Dr. Jodi Mourning at change of shift to get his delta troponin at 8 am.   Labs Review Results for orders placed or performed during the hospital encounter of 03/17/15  Comprehensive metabolic panel  Result Value Ref Range   Sodium 139 135 - 145 mmol/L   Potassium 4.0 3.5 - 5.1 mmol/L   Chloride 106 101 - 111 mmol/L   CO2 26 22 - 32 mmol/L   Glucose, Bld 156 (H) 65 -  99 mg/dL   BUN 19 6 - 20 mg/dL   Creatinine, Ser 1.61 0.61 - 1.24 mg/dL   Calcium 8.3 (L) 8.9 - 10.3 mg/dL   Total Protein 6.6 6.5 - 8.1 g/dL   Albumin 3.4 (L) 3.5 - 5.0 g/dL   AST 25 15 - 41 U/L   ALT 25 17 - 63 U/L   Alkaline Phosphatase 105 38 - 126 U/L   Total Bilirubin 0.5 0.3 - 1.2 mg/dL   GFR calc non Af Amer >60 >60 mL/min   GFR calc Af Amer >60 >60 mL/min   Anion gap 7 5 - 15  Troponin I  Result Value Ref Range   Troponin I <0.03 <0.031 ng/mL  CBC with Differential  Result Value Ref Range   WBC 10.7 (H) 4.0 - 10.5 K/uL   RBC 4.48 4.22 - 5.81 MIL/uL   Hemoglobin 13.1 13.0 - 17.0 g/dL   HCT 09.6 04.5 - 40.9 %   MCV 87.7 78.0 - 100.0 fL   MCH 29.2 26.0 - 34.0 pg   MCHC 33.3 30.0 - 36.0 g/dL   RDW 81.1 (H) 91.4 - 78.2 %   Platelets 274 150 - 400 K/uL   Neutrophils Relative % 70 %   Neutro Abs 7.6 1.7 - 7.7 K/uL   Lymphocytes Relative 17 %   Lymphs Abs 1.8 0.7 - 4.0 K/uL   Monocytes Relative 8 %   Monocytes Absolute 0.8 0.1 - 1.0 K/uL   Eosinophils Relative 4 %   Eosinophils Absolute 0.4 0.0 - 0.7 K/uL   Basophils Relative 1 %   Basophils Absolute 0.1 0.0 - 0.1 K/uL   Laboratory interpretation all normal except mild  leukocytosis, improved anemia     Imaging Review Dg Chest Port 1 View  03/17/2015   CLINICAL DATA:  Initial evaluation for acute central chest pain.  EXAM: PORTABLE CHEST 1 VIEW  COMPARISON:  Prior radiograph from 05/14/2014.  FINDINGS: Allowing for AP projection and technique, the cardiac and mediastinal silhouettes are stable in size and contour, and remain within normal limits.  Lungs are mildly hypoinflated. Persistent elevation of the lateral right hemidiaphragm, likely related to chronic scarring. Persistent subpulmonic effusion not entirely excluded. No left pleural effusion. No pulmonary edema. No focal infiltrate. No pneumothorax.  No acute osseous abnormality.  IMPRESSION: 1. No active cardiopulmonary disease. 2. Persistent elevation of the lateral right hemidiaphragm, likely related to chronic right basilar scarring, although a chronic subpulmonic effusion could also have this appearance.   Electronically Signed   By: Rise Mu M.D.   On: 03/17/2015 07:05   I have personally reviewed and evaluated these images and lab results as part of my medical decision-making.   EKG Interpretation   Date/Time:  Tuesday March 17 2015 05:32:23 EDT Ventricular Rate:  55 PR Interval:  136 QRS Duration: 107 QT Interval:  437 QTC Calculation: 418 R Axis:   58 Text Interpretation:  Sinus rhythm Baseline wander in lead(s) V2 Normal  ECG No significant change since last tracing 29 Mar 2014 Confirmed by  [facility]  MD-I, Neidra Girvan ([id number]) on 03/17/2015 5:38:10 AM      MDM   Final diagnoses:  Atypical chest pain  Gastroesophageal reflux disease without esophagitis    disposition pending  Devoria Albe, MD, Concha Pyo, MD 03/17/15 [phone]

## 2015-03-17 NOTE — ED Notes (Signed)
Pt c/o central chest pain x one hour that radiates down left arm with sob, n/v, and dizziness.

## 2016-06-28 IMAGING — CR DG CHEST 2V
2 series · 2 of 2 positions shown · non-contrast
Comparison: CT abdomen and pelvis 03/13/2014

CLINICAL DATA: Productive cough with green sputum for 2 weeks.
Chills. Sharp pain at the right lower chest with deep inspiration.
Shortness of breath.

EXAM:
CHEST  2 VIEW

[view not recorded (1 of 2)]
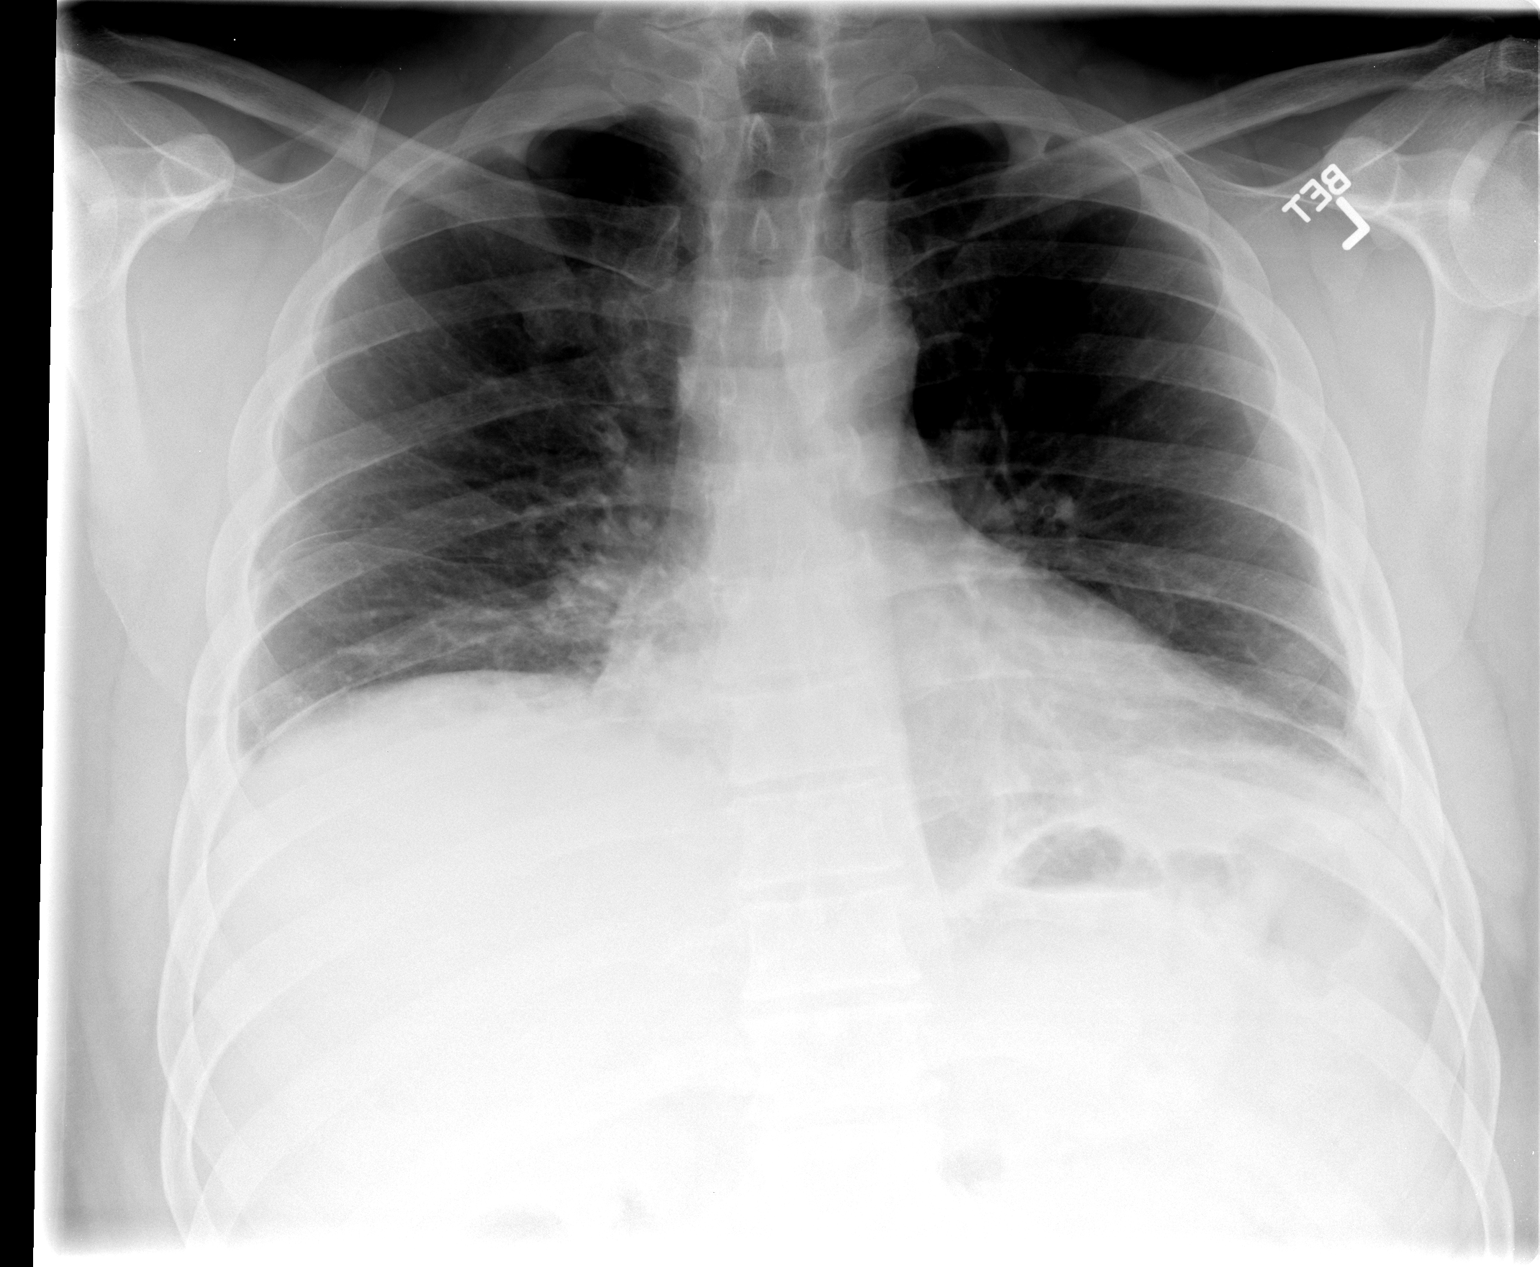

[view not recorded (2 of 2)]
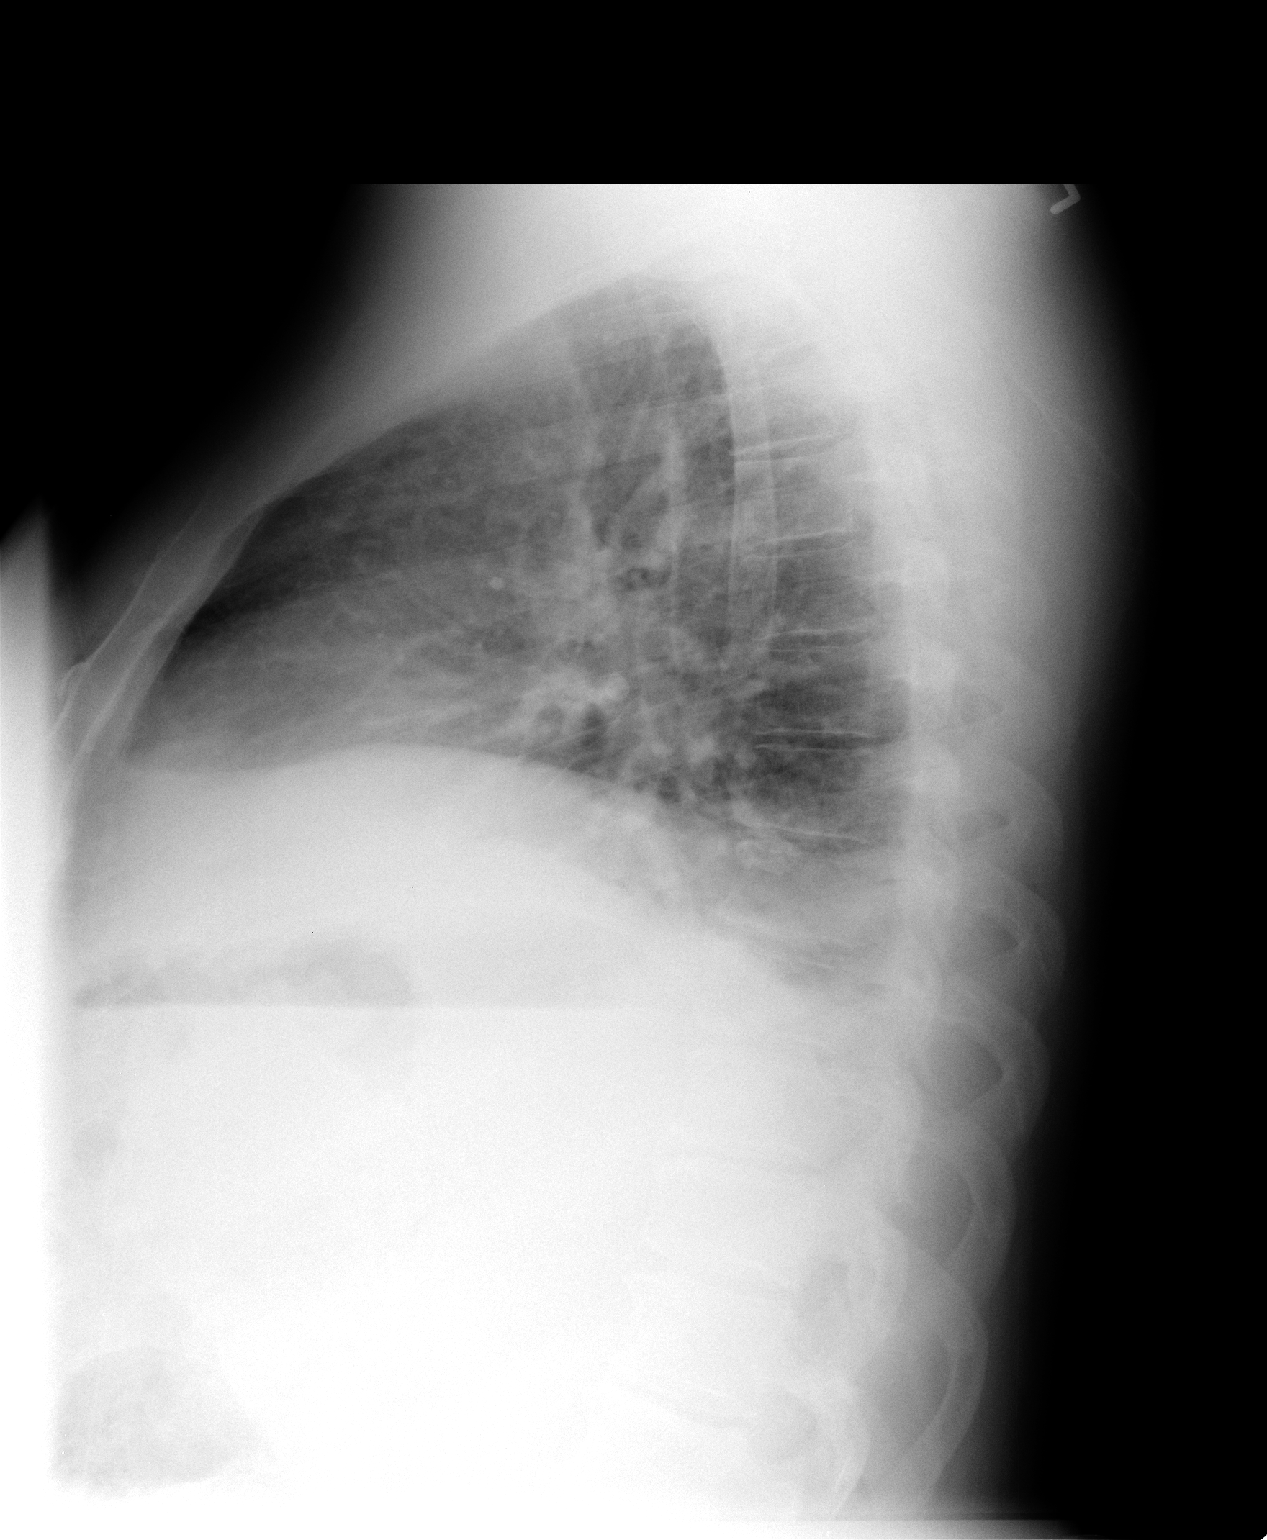

[2 of 2 positions shown; findings below may reference images not displayed]

FINDINGS: Linear infiltration in the lung bases corresponding with
consolidation seen in the right lung base and atelectasis in the
left lung base on previous CT. Shallow inspiration. Heart size and
pulmonary vascularity are normal. Probable emphysematous changes in
the upper lungs. Blunting of the right costophrenic angle suggesting
small effusions.
IMPRESSION: Linear consolidation in the right lung base with small effusion
consistent with pneumonia. Shallow inspiration. Atelectasis in the
left base.

## 2016-07-01 IMAGING — CR DG CHEST 1V PORT
1 series · 1 of 1 positions shown · non-contrast
Comparison: 03/15/2014

CLINICAL DATA: Pneumonia. History of sleep apnea, asthma, nonsmoker

EXAM:
PORTABLE CHEST - 1 VIEW

[portable]
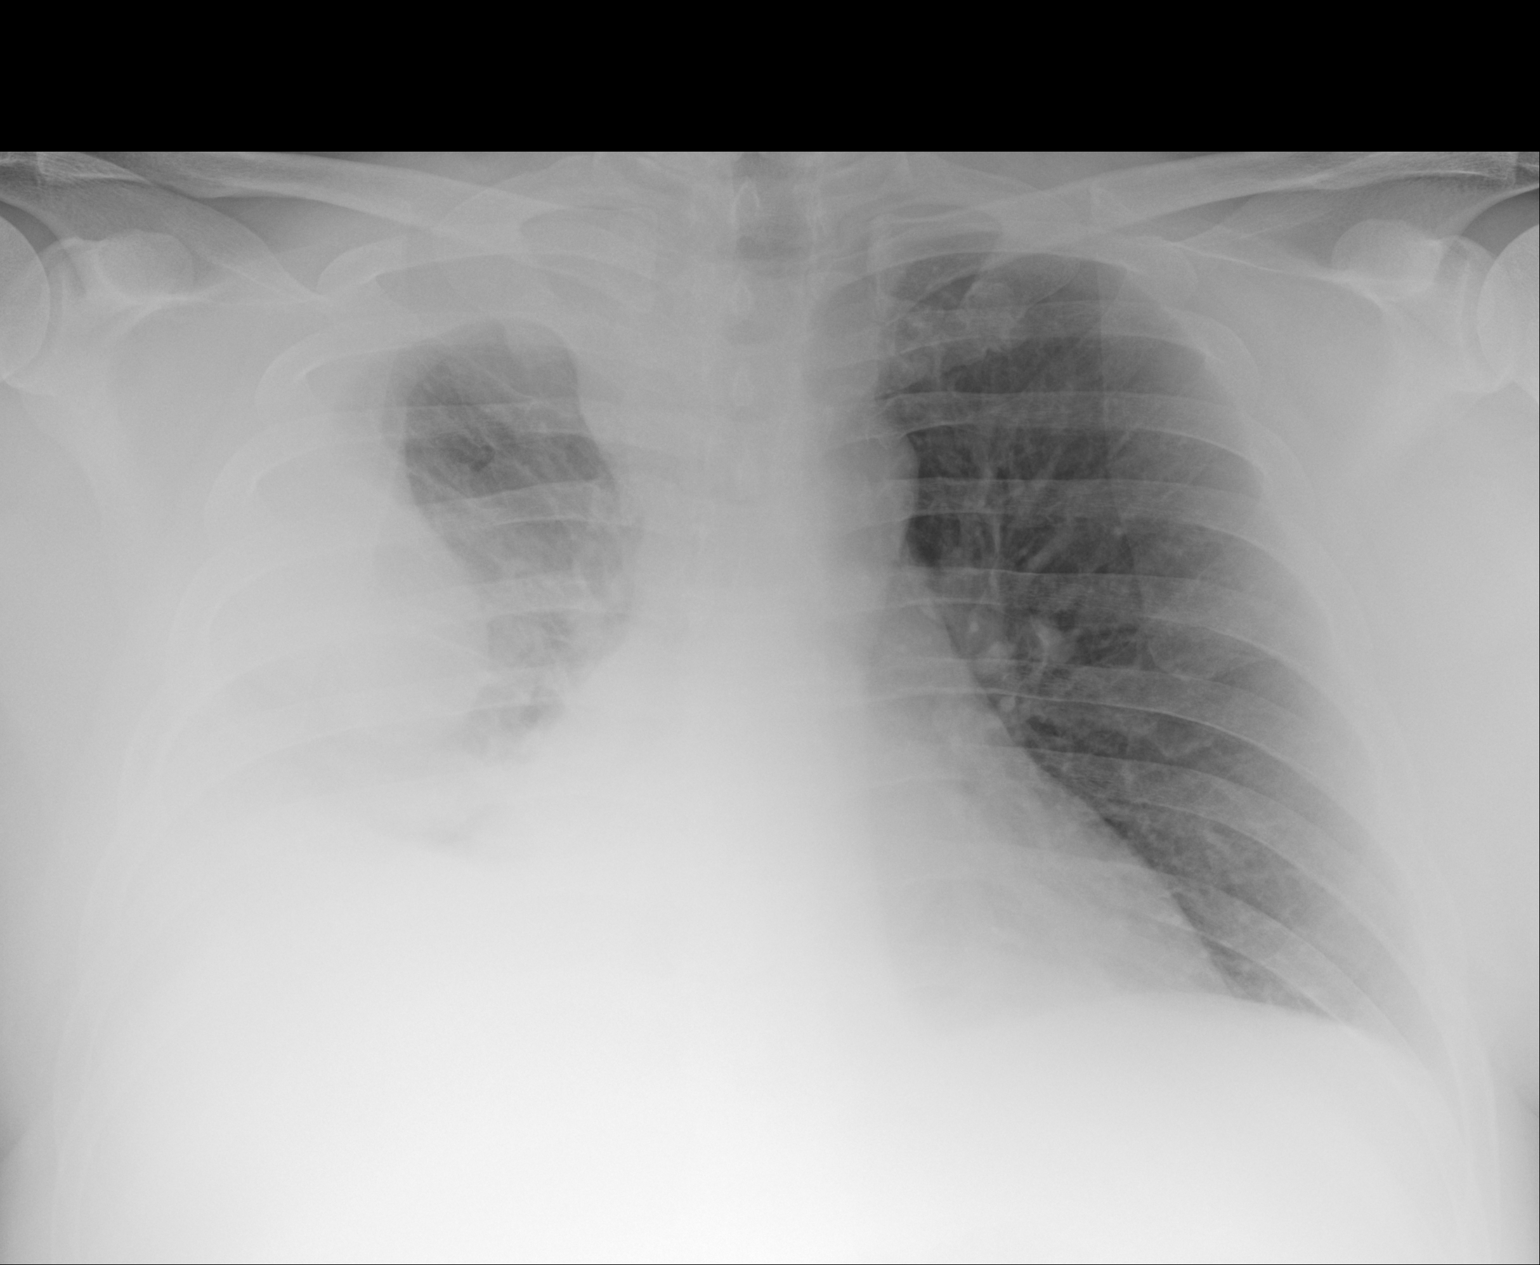

[1 of 1 positions shown; findings below may reference images not displayed]

FINDINGS: Right basilar opacity most consistent with pneumonia. There is a
loculated moderate size right pleural effusion unchanged compared
with 03/15/2014. The left lung is clear. Stable cardiomediastinal
silhouette. Unremarkable osseous structures.
IMPRESSION: Right lower lobe pneumonia with a loculated moderate size right
pleural effusion.

## 2019-08-13 ENCOUNTER — Other Ambulatory Visit: Payer: Self-pay

## 2019-08-13 ENCOUNTER — Emergency Department (HOSPITAL_COMMUNITY): Payer: BC Managed Care – PPO

## 2019-08-13 ENCOUNTER — Encounter (HOSPITAL_COMMUNITY): Payer: Self-pay | Admitting: *Deleted

## 2019-08-13 ENCOUNTER — Telehealth (HOSPITAL_COMMUNITY): Payer: Self-pay

## 2019-08-13 ENCOUNTER — Emergency Department (HOSPITAL_COMMUNITY)
Admission: EM | Admit: 2019-08-13 | Discharge: 2019-08-13 | Disposition: A | Discharge status: Home / Self Care | Payer: BC Managed Care – PPO | Attending: Emergency Medicine | Admitting: Emergency Medicine

## 2019-08-13 ENCOUNTER — Telehealth: Payer: Self-pay

## 2019-08-13 DIAGNOSIS — R0981 Nasal congestion: Secondary | ICD-10-CM | POA: Insufficient documentation

## 2019-08-13 DIAGNOSIS — Z79899 Other long term (current) drug therapy: Secondary | ICD-10-CM | POA: Diagnosis not present

## 2019-08-13 DIAGNOSIS — U071 COVID-19: Secondary | ICD-10-CM | POA: Diagnosis not present

## 2019-08-13 DIAGNOSIS — M7918 Myalgia, other site: Secondary | ICD-10-CM | POA: Diagnosis present

## 2019-08-13 DIAGNOSIS — I10 Essential (primary) hypertension: Secondary | ICD-10-CM | POA: Insufficient documentation

## 2019-08-13 DIAGNOSIS — R070 Pain in throat: Secondary | ICD-10-CM | POA: Diagnosis not present

## 2019-08-13 DIAGNOSIS — R05 Cough: Secondary | ICD-10-CM | POA: Insufficient documentation

## 2019-08-13 DIAGNOSIS — B349 Viral infection, unspecified: Secondary | ICD-10-CM

## 2019-08-13 DIAGNOSIS — J189 Pneumonia, unspecified organism: Secondary | ICD-10-CM

## 2019-08-13 LAB — SARS CORONAVIRUS 2 (TAT 6-24 HRS): SARS Coronavirus 2: POSITIVE — AB

## 2019-08-13 MED ORDER — KETOROLAC TROMETHAMINE 60 MG/2ML IM SOLN
30.0000 mg | Freq: Once | INTRAMUSCULAR | Status: AC
  Administered 2019-08-13: 30 mg via INTRAMUSCULAR
  Filled 2019-08-13: qty 2

## 2019-08-13 MED ORDER — GUAIFENESIN ER 600 MG PO TB12
600.0000 mg | ORAL_TABLET | Freq: Two times a day (BID) | ORAL | Status: DC
  Administered 2019-08-13: 600 mg via ORAL
  Filled 2019-08-13: qty 1

## 2019-08-13 MED ORDER — AMOXICILLIN-POT CLAVULANATE 875-125 MG PO TABS: 1.0000 | ORAL_TABLET | Freq: Two times a day (BID) | ORAL | 0 refills | Status: DC

## 2019-08-13 MED ORDER — ONDANSETRON 4 MG PO TBDP
4.0000 mg | ORAL_TABLET | Freq: Once | ORAL | Status: AC
  Administered 2019-08-13: 4 mg via ORAL
  Filled 2019-08-13: qty 1

## 2019-08-13 MED ORDER — DM-GUAIFENESIN ER 30-600 MG PO TB12: 1.0000 | ORAL_TABLET | Freq: Two times a day (BID) | ORAL | 0 refills | Status: DC | PRN

## 2019-08-13 MED ORDER — ONDANSETRON 4 MG PO TBDP: ORAL_TABLET | ORAL | 0 refills | Status: DC

## 2019-08-13 NOTE — ED Provider Notes (Signed)
[name] EMERGENCY DEPARTMENT Provider Note   CSN: [id number] Arrival date & time: 08/13/19  0048     History Chief Complaint  Patient presents with  . Generalized Body Aches    [name] [name] is a 59 y.o. male.   URI Presenting symptoms: congestion, cough, rhinorrhea and sore throat   Severity:  Mild Onset quality:  Gradual Duration:  2 days Timing:  Constant Progression:  Worsening Chronicity:  Recurrent Relieved by:  None tried Worsened by:  Nothing Ineffective treatments:  None tried Associated symptoms: arthralgias, headaches, myalgias, sinus pain, sneezing and swollen glands   Associated symptoms: no neck pain   Risk factors: chronic respiratory disease and diabetes mellitus        Past Medical History:  Diagnosis Date  . Asthma   . Hyperlipemia   . Hypertension   . Immune deficiency disorder (HCC)   . Kidney stone   . Obesity    BMI 42.6  . Psoriasis   . Seasonal allergies   . Sleep apnea     Patient Active Problem List   Diagnosis Date Noted  . Empyema (HCC) 03/20/2014  . Empyema of right pleural space (HCC) 03/18/2014  . Obesity 03/17/2014  . CAP (community acquired pneumonia) 03/14/2014  . Hydronephrosis, left 03/14/2014  . Back pain 03/14/2014  . Hypertension   . Sleep apnea   . Hyperlipemia     Past Surgical History:  Procedure Laterality Date  . NOSE SURGERY    . VIDEO ASSISTED THORACOSCOPY (VATS)/DECORTICATION Right 03/20/2014   Procedure: VIDEO ASSISTED THORACOSCOPY (VATS)/DECORTICATION;  Surgeon: Kerin Perna, MD;  Location: [facility] OR;  Service: Thoracic;  Laterality: Right;       History reviewed. No pertinent family history.  Social History   Tobacco Use  . Smoking status: Never Smoker  Substance Use Topics  . Alcohol use: No  . Drug use: No    Home Medications Prior to Admission medications   Medication Sig Start Date End Date Taking? Authorizing Provider  amoxicillin (AMOXIL) 500 MG capsule Take 500 mg by mouth 3  (three) times daily. Started 05/06/14    [provider]  amoxicillin-clavulanate (AUGMENTIN) 875-125 MG tablet Take 1 tablet by mouth 2 (two) times daily. One po bid x 7 days if symptoms not improving by Thursday. 08/15/19   Braelyn Jenson, Barbara Cower, MD  atorvastatin (LIPITOR) 40 MG tablet Take 40 mg by mouth daily.    [provider]  dextromethorphan-guaiFENesin (MUCINEX DM) 30-600 MG 12hr tablet Take 1 tablet by mouth 2 (two) times daily as needed for cough. 08/13/19   Hazel Wrinkle, Barbara Cower, MD  Fluticasone-Salmeterol (ADVAIR) 250-50 MCG/DOSE AEPB Inhale 1 puff into the lungs 2 (two) times daily.    [provider]  HYDROcodone-acetaminophen (NORCO/VICODIN) 5-325 MG per tablet Take 1 tablet by mouth every 6 (six) hours as needed for moderate pain.    [provider]  lisinopril (PRINIVIL,ZESTRIL) 10 MG tablet Take 10 mg by mouth daily.    [provider]  mometasone (NASONEX) 50 MCG/ACT nasal spray Place 2 sprays into the nose daily.    [provider]  montelukast (SINGULAIR) 10 MG tablet Take 10 mg by mouth at bedtime.    [provider]  omeprazole (PRILOSEC) 20 MG capsule Take 20 mg by mouth daily.    [provider]  ondansetron (ZOFRAN ODT) 4 MG disintegrating tablet 4mg  ODT q4 hours prn nausea/vomit 08/13/19   [name], 10/13/19, MD  zolpidem (AMBIEN) 10 MG tablet Take 10 mg by mouth  at bedtime as needed for sleep.    [provider]    Allergies    Patient has no known allergies.  Review of Systems   Review of Systems  HENT: Positive for congestion, rhinorrhea, sinus pain, sneezing and sore throat.   Respiratory: Positive for cough.   Musculoskeletal: Positive for arthralgias and myalgias. Negative for neck pain.  Neurological: Positive for headaches.  All other systems reviewed and are negative.   Physical Exam Updated Vital Signs BP 127/61   Pulse 72   Temp 98.6 F (37 C) (Oral)   Resp 18   Ht 5\' 9"  (1.753 m)   Wt  136.1 kg   SpO2 93%   BMI 44.30 kg/m   Physical Exam Vitals and nursing note reviewed.  Constitutional:      Appearance: He is well-developed.  HENT:     Head: Normocephalic and atraumatic.     Nose:     Right Turbinates: Enlarged and swollen.     Left Turbinates: Enlarged and swollen.     Right Sinus: Maxillary sinus tenderness present.     Left Sinus: Maxillary sinus tenderness present.     Comments: Scabbed over area on septum of left nare, no active bleeding    Mouth/Throat:     Mouth: Mucous membranes are moist.     Pharynx: Oropharynx is clear.  Eyes:     Extraocular Movements: Extraocular movements intact.     Conjunctiva/sclera: Conjunctivae normal.     Pupils: Pupils are equal, round, and reactive to light.  Cardiovascular:     Rate and Rhythm: Normal rate.  Pulmonary:     Effort: Pulmonary effort is normal. No respiratory distress.  Abdominal:     General: There is no distension.  Musculoskeletal:        General: Normal range of motion.     Cervical back: Normal range of motion.  Lymphadenopathy:     Cervical: Cervical adenopathy present.  Neurological:     Mental Status: He is alert.     ED Results / Procedures / Treatments   Labs (all labs ordered are listed, but only abnormal results are displayed) Labs Reviewed  SARS CORONAVIRUS 2 (TAT 6-24 HRS)    EKG None  Radiology DG Chest Port 1 View  Result Date: 08/13/2019 CLINICAL DATA:  Generalized body aches and cough EXAM: PORTABLE CHEST 1 VIEW COMPARISON:  March 17, 2015 FINDINGS: The heart size and mediastinal contours are within normal limits. Stable elevation of the right hemidiaphragm. Both lungs are clear. The visualized skeletal structures are unremarkable. IMPRESSION: No active disease. Electronically Signed   By: Prudencio Pair M.D.   On: 08/13/2019 01:47    Procedures Procedures (including critical care time)  Medications Ordered in ED Medications  guaiFENesin (MUCINEX) 12 hr tablet 600 mg  (600 mg Oral Given 08/13/19 0139)  ondansetron (ZOFRAN-ODT) disintegrating tablet 4 mg (4 mg Oral Given 08/13/19 0139)  ketorolac (TORADOL) injection 30 mg (30 mg Intramuscular Given 08/13/19 0141)    ED Course  I have reviewed the triage vital signs and the nursing notes.  Pertinent labs & imaging results that were available during my care of the patient were reviewed by me and considered in my medical decision making (see chart for details).    MDM Rules/Calculators/A&P                      Likely viral infection. covid vs others. Less likely bacterial, but history of same and some  chills with this. Wait and see abx rx given to use if symptoms notimproving in a few days with OTC meds. No resp distress or e/o sepsis to indicate need for hospitalization.   Final Clinical Impression(s) / ED Diagnoses Final diagnoses:  Viral syndrome    Rx / DC Orders ED Discharge Orders         Ordered    amoxicillin-clavulanate (AUGMENTIN) 875-125 MG tablet  2 times daily     08/13/19 0227    dextromethorphan-guaiFENesin (MUCINEX DM) 30-600 MG 12hr tablet  2 times daily PRN     08/13/19 0226    ondansetron (ZOFRAN ODT) 4 MG disintegrating tablet     08/13/19 0226           Serrena Linderman, Barbara Cower, MD 08/13/19 [phone]

## 2019-08-13 NOTE — Telephone Encounter (Signed)
Pt. Given COVID 19 results, verbalizes understanding. Will quarantine x 10 days from beginning of symptoms. Reviewed home safety and when to seek medical attention.Health Dept. Notified.

## 2019-08-13 NOTE — ED Triage Notes (Signed)
Pt c/o generalized body aches that started yesterday; pt states he has had a dry cough; pt denies any fever

## 2019-08-14 ENCOUNTER — Ambulatory Visit (HOSPITAL_COMMUNITY)
Admission: RE | Admit: 2019-08-14 | Discharge: 2019-08-14 | Disposition: A | Discharge status: Home / Self Care | Payer: BC Managed Care – PPO | Source: Ambulatory Visit | Attending: Pulmonary Disease | Admitting: Pulmonary Disease

## 2019-08-14 ENCOUNTER — Other Ambulatory Visit: Payer: Self-pay | Admitting: Internal Medicine

## 2019-08-14 DIAGNOSIS — Z6841 Body Mass Index (BMI) 40.0 and over, adult: Secondary | ICD-10-CM | POA: Diagnosis present

## 2019-08-14 DIAGNOSIS — I10 Essential (primary) hypertension: Secondary | ICD-10-CM

## 2019-08-14 DIAGNOSIS — U071 COVID-19: Secondary | ICD-10-CM | POA: Insufficient documentation

## 2019-08-14 MED ORDER — EPINEPHRINE 0.3 MG/0.3ML IJ SOAJ: 0.3000 mg | Freq: Once | INTRAMUSCULAR | Status: DC | PRN

## 2019-08-14 MED ORDER — METHYLPREDNISOLONE SODIUM SUCC 125 MG IJ SOLR: 125.0000 mg | Freq: Once | INTRAMUSCULAR | Status: DC | PRN

## 2019-08-14 MED ORDER — ALBUTEROL SULFATE HFA 108 (90 BASE) MCG/ACT IN AERS: 2.0000 | INHALATION_SPRAY | Freq: Once | RESPIRATORY_TRACT | Status: DC | PRN

## 2019-08-14 MED ORDER — FAMOTIDINE IN NACL 20-0.9 MG/50ML-% IV SOLN: 20.0000 mg | Freq: Once | INTRAVENOUS | Status: DC | PRN

## 2019-08-14 MED ORDER — DIPHENHYDRAMINE HCL 50 MG/ML IJ SOLN: 50.0000 mg | Freq: Once | INTRAMUSCULAR | Status: DC | PRN

## 2019-08-14 MED ORDER — SODIUM CHLORIDE 0.9 % IV SOLN
700.0000 mg | Freq: Once | INTRAVENOUS | Status: AC
  Administered 2019-08-14: 700 mg via INTRAVENOUS
  Filled 2019-08-14: qty 20

## 2019-08-14 MED ORDER — SODIUM CHLORIDE 0.9 % IV SOLN
INTRAVENOUS | Status: DC | PRN
  Administered 2019-08-14: 250 mL via INTRAVENOUS

## 2019-08-14 NOTE — Progress Notes (Signed)
  I connected by phone with [name] [name] on 08/14/2019 at 9:20 AM to discuss the potential use of an new treatment for mild to moderate COVID-19 viral infection in non-hospitalized patients.  This patient is a 59 y.o. male that meets the FDA criteria for Emergency Use Authorization of bamlanivimab or casirivimab\imdevimab.  Has a (+) direct SARS-CoV-2 viral test result  Has mild or moderate COVID-19   Is ? 58 years of age and weighs ? 40 kg  Is NOT hospitalized due to COVID-19  Is NOT requiring oxygen therapy or requiring an increase in baseline oxygen flow rate due to COVID-19  Is within 10 days of symptom onset  Has at least one of the high risk factor(s) for progression to severe COVID-19 and/or hospitalization as defined in EUA.  Specific high risk criteria : BMI >/= 35, hx htn   I have spoken and communicated the following to the patient or parent/caregiver:  1. FDA has authorized the emergency use of bamlanivimab and casirivimab\imdevimab for the treatment of mild to moderate COVID-19 in adults and pediatric patients with positive results of direct SARS-CoV-2 viral testing who are 15 years of age and older weighing at least 40 kg, and who are at high risk for progressing to severe COVID-19 and/or hospitalization.  2. The significant known and potential risks and benefits of bamlanivimab and casirivimab\imdevimab, and the extent to which such potential risks and benefits are unknown.  3. Information on available alternative treatments and the risks and benefits of those alternatives, including clinical trials.  4. Patients treated with bamlanivimab and casirivimab\imdevimab should continue to self-isolate and use infection control measures (e.g., wear mask, isolate, social distance, avoid sharing personal items, clean and disinfect "high touch" surfaces, and frequent handwashing) according to CDC guidelines.   5. The patient or parent/caregiver has the option to accept or refuse  bamlanivimab or casirivimab\imdevimab .  After reviewing this information with the patient, The patient agreed to proceed with receiving the bamlanimivab infusion and will be provided a copy of the Fact sheet prior to receiving the infusion.   Infusion scheduled for 3/3 at 1430.   Cyndee Brightly, NP-C [facility]

## 2019-08-14 NOTE — Discharge Instructions (Signed)

## 2019-08-14 NOTE — Progress Notes (Signed)
  Diagnosis: COVID-19  Physician:Dr Wright  Procedure: [facility] Infusion Clinic Med: bamlanivimab infusion - Provided patient with bamlanimivab fact sheet for patients, parents and caregivers prior to infusion.  Complications: No immediate complications noted.  Discharge: Discharged home   Shemeka Wardle W 08/14/2019  

## 2019-08-16 ENCOUNTER — Encounter (HOSPITAL_COMMUNITY): Payer: Self-pay | Admitting: Emergency Medicine

## 2019-08-16 ENCOUNTER — Emergency Department (HOSPITAL_COMMUNITY): Payer: BC Managed Care – PPO

## 2019-08-16 ENCOUNTER — Inpatient Hospital Stay (HOSPITAL_COMMUNITY)
Admission: EM | Admit: 2019-08-16 | Discharge: 2019-08-19 | DRG: 308 | Disposition: A | Discharge status: Home / Self Care | Payer: BC Managed Care – PPO | Attending: Family Medicine | Admitting: Family Medicine

## 2019-08-16 ENCOUNTER — Other Ambulatory Visit: Payer: Self-pay

## 2019-08-16 DIAGNOSIS — Z7901 Long term (current) use of anticoagulants: Secondary | ICD-10-CM

## 2019-08-16 DIAGNOSIS — I5020 Unspecified systolic (congestive) heart failure: Secondary | ICD-10-CM | POA: Diagnosis present

## 2019-08-16 DIAGNOSIS — I4891 Unspecified atrial fibrillation: Secondary | ICD-10-CM | POA: Diagnosis present

## 2019-08-16 DIAGNOSIS — I110 Hypertensive heart disease with heart failure: Secondary | ICD-10-CM | POA: Diagnosis present

## 2019-08-16 DIAGNOSIS — E875 Hyperkalemia: Secondary | ICD-10-CM | POA: Diagnosis present

## 2019-08-16 DIAGNOSIS — G4730 Sleep apnea, unspecified: Secondary | ICD-10-CM | POA: Diagnosis present

## 2019-08-16 DIAGNOSIS — D849 Immunodeficiency, unspecified: Secondary | ICD-10-CM | POA: Diagnosis present

## 2019-08-16 DIAGNOSIS — R0902 Hypoxemia: Secondary | ICD-10-CM | POA: Diagnosis not present

## 2019-08-16 DIAGNOSIS — I10 Essential (primary) hypertension: Secondary | ICD-10-CM | POA: Diagnosis present

## 2019-08-16 DIAGNOSIS — E785 Hyperlipidemia, unspecified: Secondary | ICD-10-CM | POA: Diagnosis present

## 2019-08-16 DIAGNOSIS — Z7952 Long term (current) use of systemic steroids: Secondary | ICD-10-CM

## 2019-08-16 DIAGNOSIS — Z792 Long term (current) use of antibiotics: Secondary | ICD-10-CM

## 2019-08-16 DIAGNOSIS — E669 Obesity, unspecified: Secondary | ICD-10-CM | POA: Diagnosis present

## 2019-08-16 DIAGNOSIS — E119 Type 2 diabetes mellitus without complications: Secondary | ICD-10-CM

## 2019-08-16 DIAGNOSIS — Z79899 Other long term (current) drug therapy: Secondary | ICD-10-CM

## 2019-08-16 DIAGNOSIS — G4733 Obstructive sleep apnea (adult) (pediatric): Secondary | ICD-10-CM | POA: Diagnosis present

## 2019-08-16 DIAGNOSIS — U071 COVID-19: Secondary | ICD-10-CM | POA: Diagnosis present

## 2019-08-16 DIAGNOSIS — D689 Coagulation defect, unspecified: Secondary | ICD-10-CM | POA: Diagnosis not present

## 2019-08-16 DIAGNOSIS — J45909 Unspecified asthma, uncomplicated: Secondary | ICD-10-CM | POA: Diagnosis present

## 2019-08-16 DIAGNOSIS — Z79891 Long term (current) use of opiate analgesic: Secondary | ICD-10-CM

## 2019-08-16 LAB — FERRITIN: Ferritin: 320 ng/mL (ref 24–336)

## 2019-08-16 LAB — COMPREHENSIVE METABOLIC PANEL
ALT: 33 U/L (ref 0–44)
AST: 25 U/L (ref 15–41)
Albumin: 3.4 g/dL — ABNORMAL LOW (ref 3.5–5.0)
Alkaline Phosphatase: 74 U/L (ref 38–126)
Anion gap: 11 (ref 5–15)
BUN: 22 mg/dL — ABNORMAL HIGH (ref 6–20)
CO2: 23 mmol/L (ref 22–32)
Calcium: 8.3 mg/dL — ABNORMAL LOW (ref 8.9–10.3)
Chloride: 99 mmol/L (ref 98–111)
Creatinine, Ser: 1.42 mg/dL — ABNORMAL HIGH (ref 0.61–1.24)
GFR calc Af Amer: >60 mL/min (ref >60)
GFR calc non Af Amer: 54 mL/min — ABNORMAL LOW (ref >60)
Glucose, Bld: 116 mg/dL — ABNORMAL HIGH (ref 70–99)
Potassium: 3.9 mmol/L (ref 3.5–5.1)
Sodium: 133 mmol/L — ABNORMAL LOW (ref 135–145)
Total Bilirubin: 1.1 mg/dL (ref 0.3–1.2)
Total Protein: 7.3 g/dL (ref 6.5–8.1)

## 2019-08-16 LAB — CBC WITH DIFFERENTIAL/PLATELET
Abs Immature Granulocytes: 0.08 10*3/uL — ABNORMAL HIGH (ref 0.00–0.07)
Basophils Absolute: 0 10*3/uL (ref 0.0–0.1)
Basophils Relative: 0 %
Eosinophils Absolute: 0 10*3/uL (ref 0.0–0.5)
Eosinophils Relative: 0 %
HCT: 49.1 % (ref 39.0–52.0)
Hemoglobin: 15.7 g/dL (ref 13.0–17.0)
Immature Granulocytes: 1 %
Lymphocytes Relative: 7 %
Lymphs Abs: 1 10*3/uL (ref 0.7–4.0)
MCH: 28.4 pg (ref 26.0–34.0)
MCHC: 32 g/dL (ref 30.0–36.0)
MCV: 88.8 fL (ref 80.0–100.0)
Monocytes Absolute: 1 10*3/uL (ref 0.1–1.0)
Monocytes Relative: 7 %
Neutro Abs: 11.5 10*3/uL — ABNORMAL HIGH (ref 1.7–7.7)
Neutrophils Relative %: 85 %
Platelets: 245 10*3/uL (ref 150–400)
RBC: 5.53 MIL/uL (ref 4.22–5.81)
RDW: 17.9 % — ABNORMAL HIGH (ref 11.5–15.5)
WBC: 13.7 10*3/uL — ABNORMAL HIGH (ref 4.0–10.5)
nRBC: 0 % (ref 0.0–0.2)

## 2019-08-16 LAB — C-REACTIVE PROTEIN: CRP: 15.8 mg/dL — ABNORMAL HIGH (ref <1.0)

## 2019-08-16 LAB — TROPONIN I (HIGH SENSITIVITY)
Troponin I (High Sensitivity): 16 ng/L (ref <18)
Troponin I (High Sensitivity): 13 ng/L (ref <18)

## 2019-08-16 LAB — LACTIC ACID, PLASMA
Lactic Acid, Venous: 1.7 mmol/L (ref 0.5–1.9)
Lactic Acid, Venous: 1.2 mmol/L (ref 0.5–1.9)

## 2019-08-16 LAB — D-DIMER, QUANTITATIVE: D-Dimer, Quant: 0.46 ug/mL-FEU (ref 0.00–0.50)

## 2019-08-16 LAB — FIBRINOGEN: Fibrinogen: 727 mg/dL — ABNORMAL HIGH (ref 210–475)

## 2019-08-16 LAB — PROCALCITONIN: Procalcitonin: 0.17 ng/mL

## 2019-08-16 LAB — LACTATE DEHYDROGENASE: LDH: 251 U/L — ABNORMAL HIGH (ref 98–192)

## 2019-08-16 LAB — TRIGLYCERIDES: Triglycerides: 106 mg/dL (ref <150)

## 2019-08-16 MED ORDER — ALBUTEROL SULFATE HFA 108 (90 BASE) MCG/ACT IN AERS
4.0000 | INHALATION_SPRAY | Freq: Once | RESPIRATORY_TRACT | Status: AC
  Administered 2019-08-16: 4 via RESPIRATORY_TRACT
  Filled 2019-08-16: qty 6.7

## 2019-08-16 MED ORDER — DEXAMETHASONE SODIUM PHOSPHATE 10 MG/ML IJ SOLN
6.0000 mg | Freq: Once | INTRAMUSCULAR | Status: AC
  Administered 2019-08-16: 21:00:00 6 mg via INTRAVENOUS
  Filled 2019-08-16: qty 1

## 2019-08-16 MED ORDER — FENTANYL CITRATE (PF) 100 MCG/2ML IJ SOLN
50.0000 ug | Freq: Once | INTRAMUSCULAR | Status: AC
  Administered 2019-08-16: 50 ug via INTRAVENOUS
  Filled 2019-08-16: qty 2

## 2019-08-16 MED ORDER — AEROCHAMBER PLUS FLO-VU MEDIUM MISC
1.0000 | Freq: Once | Status: AC
  Administered 2019-08-16: 1
  Filled 2019-08-16: qty 1

## 2019-08-16 NOTE — ED Triage Notes (Signed)
Pt C/O SOB that started last night. Pt Dx with COVID 08/13/2019. Pt sats 96% on RA. Denies CP.

## 2019-08-16 NOTE — ED Provider Notes (Addendum)
[facility] EMERGENCY DEPARTMENT Provider Note   CSN: [id number] Arrival date & time: 08/16/19  1836     History Chief Complaint  Patient presents with  . Shortness of Breath    [name] [name] Age is a 59 y.o. male with a history of hypertension, hyperlipidemia, asthma, obesity, sleep apnea, & prior pleural empyema who tested positive for COVID 19 on 08/13/19 who presents to the ED with complaints of worsening dyspnea since last night. Patient states onset of sxs 03/01 with fatigue, subsequently developed nasal congestion, sore throat, nausea, diarrhea, productive cough of yellow phelgm sputum, chills, and generalized body aches that seem the worst in his upper back, also having some pain to the R chest. Discomfort is constant, worse with coughing/palpation/movement, no alleviating factors. Yesterday he started to feel very short of breath with progressive worsening, dyspnea is constant, worse with activity, no alleviating factors. He feels he is wheezing at times with this. Does not have an inhaler to use at home. Denies fever, abdominal pain, melena, hematochezia, syncope, unilateral leg pain/swelling, hemoptysis, recent surgery/trauma, recent long travel, hormone use, personal hx of cancer, or hx of DVT/PE.       HPI     Past Medical History:  Diagnosis Date  . Asthma   . Hyperlipemia   . Hypertension   . Immune deficiency disorder (HCC)   . Kidney stone   . Obesity    BMI 42.6  . Psoriasis   . Seasonal allergies   . Sleep apnea     Patient Active Problem List   Diagnosis Date Noted  . Empyema (HCC) 03/20/2014  . Empyema of right pleural space (HCC) 03/18/2014  . Obesity 03/17/2014  . CAP (community acquired pneumonia) 03/14/2014  . Hydronephrosis, left 03/14/2014  . Back pain 03/14/2014  . Hypertension   . Sleep apnea   . Hyperlipemia     Past Surgical History:  Procedure Laterality Date  . NOSE SURGERY    . VIDEO ASSISTED THORACOSCOPY (VATS)/DECORTICATION Right  03/20/2014   Procedure: VIDEO ASSISTED THORACOSCOPY (VATS)/DECORTICATION;  Surgeon: Kerin Perna, MD;  Location: [facility] OR;  Service: Thoracic;  Laterality: Right;       No family history on file.  Social History   Tobacco Use  . Smoking status: Never Smoker  . Smokeless tobacco: Never Used  Substance Use Topics  . Alcohol use: No  . Drug use: No    Home Medications Prior to Admission medications   Medication Sig Start Date End Date Taking? Authorizing Provider  amoxicillin (AMOXIL) 500 MG capsule Take 500 mg by mouth 3 (three) times daily. Started 05/06/14    [provider]  amoxicillin-clavulanate (AUGMENTIN) 875-125 MG tablet Take 1 tablet by mouth 2 (two) times daily. One po bid x 7 days if symptoms not improving by Thursday. 08/15/19   Mesner, Barbara Cower, MD  atorvastatin (LIPITOR) 80 MG tablet Take 80 mg by mouth daily. 04/18/19   [provider]  azelastine (ASTELIN) 0.1 % nasal spray Place 1 spray into both nostrils 2 (two) times daily. 07/07/19   [provider]  azithromycin (ZITHROMAX) 250 MG tablet Take by mouth as directed. 08/16/19   [provider]  dextromethorphan-guaiFENesin (MUCINEX DM) 30-600 MG 12hr tablet Take 1 tablet by mouth 2 (two) times daily as needed for cough. 08/13/19   Mesner, Barbara Cower, MD  Fluticasone-Salmeterol (ADVAIR) 250-50 MCG/DOSE AEPB Inhale 1 puff into the lungs 2 (two) times daily.    [provider]  folic acid (FOLVITE) 1 MG tablet  Take 1 mg by mouth daily. 07/25/19   [provider]  HYDROcodone-acetaminophen (NORCO/VICODIN) 5-325 MG per tablet Take 1 tablet by mouth every 6 (six) hours as needed for moderate pain.    [provider]  hydroxychloroquine (PLAQUENIL) 200 MG tablet  08/16/19   [provider]  ibuprofen (ADVIL) 800 MG tablet Take 800 mg by mouth 3 (three) times daily. 08/13/19   [provider]  lisinopril (ZESTRIL) 20 MG tablet Take 20 mg by mouth daily. 06/14/19    [provider]  methotrexate (RHEUMATREX) 2.5 MG tablet Take 20 mg by mouth once a week. 07/25/19   [provider]  mometasone (NASONEX) 50 MCG/ACT nasal spray Place 2 sprays into the nose daily.    [provider]  montelukast (SINGULAIR) 10 MG tablet Take 10 mg by mouth at bedtime.    [provider]  omeprazole (PRILOSEC) 20 MG capsule Take 20 mg by mouth daily.    [provider]  ondansetron (ZOFRAN ODT) 4 MG disintegrating tablet 4mg  ODT q4 hours prn nausea/vomit 08/13/19   Mesner, Corene Cornea, MD  predniSONE (STERAPRED UNI-PAK 21 TAB) 5 MG (21) TBPK tablet Take 5-30 mg by mouth See admin instructions. For 6 days take as directed per package instructions [phone]) 08/16/19   [provider]  traMADol (ULTRAM) 50 MG tablet Take 50 mg by mouth 4 (four) times daily. 07/25/19   [provider]  zolpidem (AMBIEN) 10 MG tablet Take 10 mg by mouth at bedtime as needed for sleep.    [provider]    Allergies    Patient has no known allergies.  Review of Systems   Review of Systems  Constitutional: Positive for chills and fatigue. Negative for fever.  HENT: Positive for congestion and sore throat. Negative for ear pain.   Respiratory: Positive for cough, shortness of breath and wheezing.   Cardiovascular: Positive for chest pain. Negative for leg swelling.  Gastrointestinal: Positive for diarrhea and nausea. Negative for abdominal pain, anal bleeding, blood in stool, constipation and vomiting.  Genitourinary: Negative for dysuria.  Musculoskeletal: Positive for back pain and myalgias.  Neurological: Negative for syncope.  All other systems reviewed and are negative.   Physical Exam Updated Vital Signs BP 134/65   Pulse 99   Temp 98.2 F (36.8 C) (Oral)   Resp 20   Ht 5\' 9"  (1.753 m)   Wt 136.1 kg   SpO2 95%   BMI 44.30 kg/m   Physical Exam Vitals and nursing note reviewed.  Constitutional:      General: He is  not in acute distress.    Appearance: He is well-developed. He is not toxic-appearing.  HENT:     Head: Normocephalic and atraumatic.     Right Ear: Ear canal normal. Tympanic membrane is not perforated, erythematous, retracted or bulging.     Left Ear: Ear canal normal. Tympanic membrane is not perforated, erythematous, retracted or bulging.     Ears:     Comments: No mastoid erythema/swellng/tenderness.     Nose:     Right Sinus: No maxillary sinus tenderness or frontal sinus tenderness.     Left Sinus: No maxillary sinus tenderness or frontal sinus tenderness.     Mouth/Throat:     Pharynx: Oropharynx is clear. Uvula midline. No oropharyngeal exudate or posterior oropharyngeal erythema.     Comments: Posterior oropharynx is symmetric appearing. Patient tolerating own secretions without difficulty. No trismus. No drooling. No hot potato voice. No swelling beneath  the tongue, submandibular compartment is soft.  Eyes:     General:        Right eye: No discharge.        Left eye: No discharge.     Conjunctiva/sclera: Conjunctivae normal.  Cardiovascular:     Rate and Rhythm: Normal rate and regular rhythm.  Pulmonary:     Effort: Pulmonary effort is normal. No respiratory distress.     Breath sounds: Normal breath sounds. No wheezing, rhonchi or rales.  Chest:     Chest wall: Tenderness (posterior and to R anterior chest wall) present.  Abdominal:     General: There is no distension.     Palpations: Abdomen is soft.     Tenderness: There is no abdominal tenderness.  Musculoskeletal:     Cervical back: Neck supple. No rigidity.     Comments: Diffuse tenderness to the upper back/posterior chest wall, R > L.  Trace symmetric lower leg edema present   Lymphadenopathy:     Cervical: No cervical adenopathy.  Skin:    General: Skin is warm and dry.     Findings: No rash.  Neurological:     Mental Status: He is alert.  Psychiatric:        Behavior: Behavior normal.     ED Results  / Procedures / Treatments   Labs (all labs ordered are listed, but only abnormal results are displayed) Labs Reviewed - No data to display  EKG EKG Interpretation  Date/Time:  Friday August 16 2019 19:56:18 EST Ventricular Rate:  93 PR Interval:    QRS Duration: 94 QT Interval:  342 QTC Calculation: 426 R Axis:   31 Text Interpretation: Sinus rhythm Borderline short PR interval Nonspecific T abnormalities, inferior leads Baseline wander in lead(s) V2 Since last tracing Nonspecific T wave abnormality NOW PRESENT Confirmed by Bernette Mayers  MD, CHARLES [phone]) on 08/16/2019 8:28:38 PM   Radiology No results found.  Procedures Procedures (including critical care time)  Medications Ordered in ED Medications - No data to display  ED Course  I have reviewed the triage vital signs and the nursing notes.  Pertinent labs & imaging results that were available during my care of the patient were reviewed by me and considered in my medical decision making (see chart for details).  Mohmed Farver was evaluated in Emergency Department on 08/16/2019 for the symptoms described in the history of present illness. He/she was evaluated in the context of the global COVID-19 pandemic, which necessitated consideration that the patient might be at risk for infection with the SARS-CoV-2 virus that causes COVID-19. Institutional protocols and algorithms that pertain to the evaluation of patients at risk for COVID-19 are in a state of rapid change based on information released by regulatory bodies including the CDC and federal and state organizations. These policies and algorithms were followed during the patient's care in the ED.    MDM Rules/Calculators/A&P                      Patient diagnosed with COVID-19 08/13/2019 presents to the emergency department with worsening shortness of breath over the last 24 hours.  Patient nontoxic, vitals WNL at rest.  His lungs are fairly clear.  On my initial assessment had patient  get up and ambulate throughout exam room, he became very tachypneic with respiratory rates in the 30s and desaturated to 85% on room air, returned to mid 90s at rest therefore no oxygen applied.  Plan for albuterol inhaler, Decadron,  work-up, and admission. EKG with nonspecific T wave abnormality now present otherwise no significant change. CXR without acute process- personally interpreted, agree with radiologist.   22:04: Patient care signed out to Burgess Amor PA-C Pending additional labs and consultation for admission given hypoxia and tachypnea with ambulation.   Findings and plan of care discussed with supervising physician Dr. Bernette Mayers who is in agreement.   Final Clinical Impression(s) / ED Diagnoses Final diagnoses:  None    Rx / DC Orders ED Discharge Orders    None       Desmond Lope 08/16/19 2205    Pollyann Savoy, MD 08/16/19 2356  CRITICAL CARE Performed by: Harvie Heck   Total critical care time: 30 minutes  Critical care time was exclusive of separately billable procedures and treating other patients.  Critical care was necessary to treat or prevent imminent or life-threatening deterioration.  Critical care was time spent personally by me on the following activities: development of treatment plan with patient and/or surrogate as well as nursing, discussions with consultants, evaluation of patient's response to treatment, examination of patient, obtaining history from patient or surrogate, ordering and performing treatments and interventions, ordering and review of laboratory studies, ordering and review of radiographic studies, pulse oximetry and re-evaluation of patient's condition.    Desmond Lope 08/26/19 0825    Pollyann Savoy, MD 08/27/19 1257

## 2019-08-16 NOTE — ED Provider Notes (Signed)
Patient signed out to me at shift change from [facility], PA-C.  Patient is a 59 year old diagnosed with Covid 3 days ago presenting with increasing shortness of breath with hypoxia to 85% on room air in association with tachypnea.  He was treated with Decadron and albuterol MDI.  Labs, EKG and chest x-ray per below.  He will need admission given his hypoxia and tachypnea.  At rest his oxygen is maintaining in the mid 90s.   Results for orders placed or performed during the hospital encounter of 08/16/19  Blood Culture (routine x 2)   Specimen: Right Antecubital; Blood  Result Value Ref Range   Specimen Description RIGHT ANTECUBITAL    Special Requests      BOTTLES DRAWN AEROBIC AND ANAEROBIC Blood Culture results may not be optimal due to an excessive volume of blood received in culture bottles Performed at [facility], [address], [place], [place] [address]    Culture PENDING    Report Status PENDING   Blood Culture (routine x 2)   Specimen: Left Antecubital; Blood  Result Value Ref Range   Specimen Description LEFT ANTECUBITAL    Special Requests      BOTTLES DRAWN AEROBIC AND ANAEROBIC Blood Culture results may not be optimal due to an excessive volume of blood received in culture bottles Performed at [facility], [address], [place],  [address]    Culture PENDING    Report Status PENDING   Lactic acid, plasma  Result Value Ref Range   Lactic Acid, Venous 1.7 0.5 - 1.9 mmol/L  Lactic acid, plasma  Result Value Ref Range   Lactic Acid, Venous 1.2 0.5 - 1.9 mmol/L  CBC WITH DIFFERENTIAL  Result Value Ref Range   WBC 13.7 (H) 4.0 - 10.5 K/uL   RBC 5.53 4.22 - 5.81 MIL/uL   Hemoglobin 15.7 13.0 - 17.0 g/dL   HCT 49.1 39.0 - 52.0 %   MCV 88.8 80.0 - 100.0 fL   MCH 28.4 26.0 - 34.0 pg   MCHC 32.0 30.0 - 36.0 g/dL   RDW 17.9 (H) 11.5 - 15.5 %   Platelets 245 150 - 400 K/uL   nRBC 0.0 0.0 - 0.2 %   Neutrophils Relative % 85 %   Neutro Abs 11.5 (H) 1.7 - 7.7  K/uL   Lymphocytes Relative 7 %   Lymphs Abs 1.0 0.7 - 4.0 K/uL   Monocytes Relative 7 %   Monocytes Absolute 1.0 0.1 - 1.0 K/uL   Eosinophils Relative 0 %   Eosinophils Absolute 0.0 0.0 - 0.5 K/uL   Basophils Relative 0 %   Basophils Absolute 0.0 0.0 - 0.1 K/uL   Immature Granulocytes 1 %   Abs Immature Granulocytes 0.08 (H) 0.00 - 0.07 K/uL  Comprehensive metabolic panel  Result Value Ref Range   Sodium 133 (L) 135 - 145 mmol/L   Potassium 3.9 3.5 - 5.1 mmol/L   Chloride 99 98 - 111 mmol/L   CO2 23 22 - 32 mmol/L   Glucose, Bld 116 (H) 70 - 99 mg/dL   BUN 22 (H) 6 - 20 mg/dL   Creatinine, Ser 1.42 (H) 0.61 - 1.24 mg/dL   Calcium 8.3 (L) 8.9 - 10.3 mg/dL   Total Protein 7.3 6.5 - 8.1 g/dL   Albumin 3.4 (L) 3.5 - 5.0 g/dL   AST 25 15 - 41 U/L   ALT 33 0 - 44 U/L   Alkaline Phosphatase 74 38 - 126 U/L   Total  Bilirubin 1.1 0.3 - 1.2 mg/dL   GFR calc non Af Amer 54 (L) >60 mL/min   GFR calc Af Amer >60 >60 mL/min   Anion gap 11 5 - 15  D-dimer, quantitative  Result Value Ref Range   D-Dimer, Quant 0.46 0.00 - 0.50 ug/mL-FEU  Procalcitonin  Result Value Ref Range   Procalcitonin 0.17 ng/mL  Lactate dehydrogenase  Result Value Ref Range   LDH 251 (H) 98 - 192 U/L  Ferritin  Result Value Ref Range   Ferritin 320 24 - 336 ng/mL  Triglycerides  Result Value Ref Range   Triglycerides 106 <150 mg/dL  Fibrinogen  Result Value Ref Range   Fibrinogen 727 (H) 210 - 475 mg/dL  C-reactive protein  Result Value Ref Range   CRP 15.8 (H) <1.0 mg/dL  Troponin I (High Sensitivity)  Result Value Ref Range   Troponin I (High Sensitivity) 16 <18 ng/L   DG Chest Port 1 View  Result Date: 08/16/2019 CLINICAL DATA:  Dyspnea. Additional history provided: Patient reports shortness of breath that began last night, diagnosed with COVID 08/13/2019, history of asthma and hypertension EXAM: PORTABLE CHEST 1 VIEW COMPARISON:  Chest radiograph 08/13/2019 FINDINGS: Heart size within normal  limits. No evidence of airspace consolidation within the lungs. No evidence of pleural effusion or pneumothorax. No acute bony abnormality. IMPRESSION: No evidence of acute cardiopulmonary abnormality. Electronically Signed   By: Jackey Loge DO   On: 08/16/2019 21:32   DG Chest Port 1 View  Result Date: 08/13/2019 CLINICAL DATA:  Generalized body aches and cough EXAM: PORTABLE CHEST 1 VIEW COMPARISON:  March 17, 2015 FINDINGS: The heart size and mediastinal contours are within normal limits. Stable elevation of the right hemidiaphragm. Both lungs are clear. The visualized skeletal structures are unremarkable. IMPRESSION: No active disease. Electronically Signed   By: Jonna Clark M.D.   On: 08/13/2019 01:47      Victoriano Lain 08/16/19 2348    Pollyann Savoy, MD 08/16/19 [phone]

## 2019-08-17 ENCOUNTER — Inpatient Hospital Stay (HOSPITAL_COMMUNITY): Payer: BC Managed Care – PPO

## 2019-08-17 DIAGNOSIS — R0902 Hypoxemia: Secondary | ICD-10-CM | POA: Diagnosis present

## 2019-08-17 DIAGNOSIS — Z79899 Other long term (current) drug therapy: Secondary | ICD-10-CM | POA: Diagnosis not present

## 2019-08-17 DIAGNOSIS — D849 Immunodeficiency, unspecified: Secondary | ICD-10-CM | POA: Diagnosis present

## 2019-08-17 DIAGNOSIS — I110 Hypertensive heart disease with heart failure: Secondary | ICD-10-CM | POA: Diagnosis present

## 2019-08-17 DIAGNOSIS — J45909 Unspecified asthma, uncomplicated: Secondary | ICD-10-CM | POA: Diagnosis present

## 2019-08-17 DIAGNOSIS — Z7952 Long term (current) use of systemic steroids: Secondary | ICD-10-CM | POA: Diagnosis not present

## 2019-08-17 DIAGNOSIS — E119 Type 2 diabetes mellitus without complications: Secondary | ICD-10-CM | POA: Diagnosis present

## 2019-08-17 DIAGNOSIS — I5020 Unspecified systolic (congestive) heart failure: Secondary | ICD-10-CM | POA: Diagnosis present

## 2019-08-17 DIAGNOSIS — Z792 Long term (current) use of antibiotics: Secondary | ICD-10-CM | POA: Diagnosis not present

## 2019-08-17 DIAGNOSIS — I4891 Unspecified atrial fibrillation: Secondary | ICD-10-CM

## 2019-08-17 DIAGNOSIS — E669 Obesity, unspecified: Secondary | ICD-10-CM | POA: Diagnosis present

## 2019-08-17 DIAGNOSIS — G4733 Obstructive sleep apnea (adult) (pediatric): Secondary | ICD-10-CM | POA: Diagnosis present

## 2019-08-17 DIAGNOSIS — U071 COVID-19: Secondary | ICD-10-CM | POA: Diagnosis present

## 2019-08-17 DIAGNOSIS — D689 Coagulation defect, unspecified: Secondary | ICD-10-CM | POA: Diagnosis present

## 2019-08-17 DIAGNOSIS — Z79891 Long term (current) use of opiate analgesic: Secondary | ICD-10-CM | POA: Diagnosis not present

## 2019-08-17 DIAGNOSIS — E785 Hyperlipidemia, unspecified: Secondary | ICD-10-CM | POA: Diagnosis present

## 2019-08-17 DIAGNOSIS — E875 Hyperkalemia: Secondary | ICD-10-CM | POA: Diagnosis present

## 2019-08-17 LAB — LIPID PANEL
Cholesterol: 144 mg/dL (ref 0–200)
HDL: 32 mg/dL — ABNORMAL LOW (ref >40)
LDL Cholesterol: 94 mg/dL (ref 0–99)
Total CHOL/HDL Ratio: 4.5 RATIO
Triglycerides: 92 mg/dL (ref <150)
VLDL: 18 mg/dL (ref 0–40)

## 2019-08-17 LAB — CBC
HCT: 47.7 % (ref 39.0–52.0)
Hemoglobin: 15.6 g/dL (ref 13.0–17.0)
MCH: 29.2 pg (ref 26.0–34.0)
MCHC: 32.7 g/dL (ref 30.0–36.0)
MCV: 89.2 fL (ref 80.0–100.0)
Platelets: 226 10*3/uL (ref 150–400)
RBC: 5.35 MIL/uL (ref 4.22–5.81)
RDW: 18 % — ABNORMAL HIGH (ref 11.5–15.5)
WBC: 11.1 10*3/uL — ABNORMAL HIGH (ref 4.0–10.5)
nRBC: 0 % (ref 0.0–0.2)

## 2019-08-17 LAB — HEMOGLOBIN A1C
Hgb A1c MFr Bld: 6.6 % — ABNORMAL HIGH (ref 4.8–5.6)
Mean Plasma Glucose: 142.72 mg/dL

## 2019-08-17 LAB — ECHOCARDIOGRAM COMPLETE
Height: 69 in
Weight: 4800 oz

## 2019-08-17 LAB — VITAMIN D 25 HYDROXY (VIT D DEFICIENCY, FRACTURES): Vit D, 25-Hydroxy: 80.07 ng/mL (ref 30–100)

## 2019-08-17 LAB — CREATININE, SERUM
Creatinine, Ser: 1.22 mg/dL (ref 0.61–1.24)
GFR calc Af Amer: >60 mL/min (ref >60)
GFR calc non Af Amer: >60 mL/min (ref >60)

## 2019-08-17 LAB — MRSA PCR SCREENING: MRSA by PCR: POSITIVE — AB

## 2019-08-17 LAB — HIV ANTIBODY (ROUTINE TESTING W REFLEX): HIV Screen 4th Generation wRfx: NONREACTIVE

## 2019-08-17 LAB — ABO/RH: ABO/RH(D): O POS

## 2019-08-17 LAB — TSH: TSH: 0.772 u[IU]/mL (ref 0.350–4.500)

## 2019-08-17 MED ORDER — METOPROLOL TARTRATE 5 MG/5ML IV SOLN
5.0000 mg | INTRAVENOUS | Status: DC | PRN
  Administered 2019-08-17: 22:00:00 5 mg via INTRAVENOUS
  Filled 2019-08-17: qty 5

## 2019-08-17 MED ORDER — METOPROLOL TARTRATE 25 MG PO TABS
25.0000 mg | ORAL_TABLET | Freq: Two times a day (BID) | ORAL | Status: DC
  Administered 2019-08-17 (×2): 25 mg via ORAL
  Filled 2019-08-17 (×2): qty 1

## 2019-08-17 MED ORDER — PANTOPRAZOLE SODIUM 40 MG PO TBEC
40.0000 mg | DELAYED_RELEASE_TABLET | Freq: Every day | ORAL | Status: DC
  Administered 2019-08-17 – 2019-08-19 (×3): 40 mg via ORAL
  Filled 2019-08-17 (×3): qty 1

## 2019-08-17 MED ORDER — ATORVASTATIN CALCIUM 40 MG PO TABS
80.0000 mg | ORAL_TABLET | Freq: Every day | ORAL | Status: DC
  Administered 2019-08-17 – 2019-08-18 (×2): 80 mg via ORAL
  Filled 2019-08-17 (×2): qty 2

## 2019-08-17 MED ORDER — MOMETASONE FURO-FORMOTEROL FUM 200-5 MCG/ACT IN AERO
2.0000 | INHALATION_SPRAY | Freq: Two times a day (BID) | RESPIRATORY_TRACT | Status: DC
  Administered 2019-08-17 – 2019-08-19 (×5): 2 via RESPIRATORY_TRACT
  Filled 2019-08-17: qty 8.8

## 2019-08-17 MED ORDER — ZOLPIDEM TARTRATE 5 MG PO TABS
10.0000 mg | ORAL_TABLET | Freq: Every evening | ORAL | Status: DC | PRN
  Administered 2019-08-17 – 2019-08-18 (×2): 10 mg via ORAL
  Filled 2019-08-17 (×2): qty 2

## 2019-08-17 MED ORDER — HYDROCODONE-ACETAMINOPHEN 5-325 MG PO TABS: 1.0000 | ORAL_TABLET | Freq: Four times a day (QID) | ORAL | Status: DC | PRN

## 2019-08-17 MED ORDER — TRAMADOL HCL 50 MG PO TABS: 50.0000 mg | ORAL_TABLET | Freq: Four times a day (QID) | ORAL | Status: DC | PRN

## 2019-08-17 MED ORDER — DEXAMETHASONE 4 MG PO TABS
6.0000 mg | ORAL_TABLET | ORAL | Status: DC
  Administered 2019-08-17 – 2019-08-19 (×3): 6 mg via ORAL
  Filled 2019-08-17 (×3): qty 2

## 2019-08-17 MED ORDER — ORAL CARE MOUTH RINSE
15.0000 mL | Freq: Two times a day (BID) | OROMUCOSAL | Status: DC
  Administered 2019-08-17 – 2019-08-19 (×4): 15 mL via OROMUCOSAL

## 2019-08-17 MED ORDER — ONDANSETRON HCL 4 MG/2ML IJ SOLN: 4.0000 mg | Freq: Four times a day (QID) | INTRAMUSCULAR | Status: DC | PRN

## 2019-08-17 MED ORDER — FOLIC ACID 1 MG PO TABS
1.0000 mg | ORAL_TABLET | Freq: Every day | ORAL | Status: DC
  Administered 2019-08-17 – 2019-08-19 (×3): 1 mg via ORAL
  Filled 2019-08-17 (×3): qty 1

## 2019-08-17 MED ORDER — ONDANSETRON HCL 4 MG PO TABS: 4.0000 mg | ORAL_TABLET | Freq: Four times a day (QID) | ORAL | Status: DC | PRN

## 2019-08-17 MED ORDER — HEPARIN SODIUM (PORCINE) 5000 UNIT/ML IJ SOLN: 5000.0000 [IU] | Freq: Three times a day (TID) | INTRAMUSCULAR | Status: DC

## 2019-08-17 MED ORDER — ENOXAPARIN SODIUM 80 MG/0.8ML ~~LOC~~ SOLN
65.0000 mg | Freq: Every day | SUBCUTANEOUS | Status: DC
  Administered 2019-08-17: 65 mg via SUBCUTANEOUS
  Filled 2019-08-17 (×2): qty 0.8

## 2019-08-17 MED ORDER — DILTIAZEM HCL-DEXTROSE 125-5 MG/125ML-% IV SOLN (PREMIX)
5.0000 mg/h | INTRAVENOUS | Status: DC
  Administered 2019-08-17: 5 mg/h via INTRAVENOUS
  Filled 2019-08-17: qty 125

## 2019-08-17 MED ORDER — APIXABAN 5 MG PO TABS
5.0000 mg | ORAL_TABLET | Freq: Two times a day (BID) | ORAL | Status: DC
  Administered 2019-08-17 – 2019-08-19 (×4): 5 mg via ORAL
  Filled 2019-08-17 (×5): qty 1

## 2019-08-17 MED ORDER — TRAMADOL HCL 50 MG PO TABS: 50.0000 mg | ORAL_TABLET | Freq: Four times a day (QID) | ORAL | Status: DC

## 2019-08-17 MED ORDER — CHLORHEXIDINE GLUCONATE CLOTH 2 % EX PADS
6.0000 | MEDICATED_PAD | Freq: Every day | CUTANEOUS | Status: DC
  Administered 2019-08-17 – 2019-08-19 (×3): 6 via TOPICAL

## 2019-08-17 MED ORDER — MONTELUKAST SODIUM 10 MG PO TABS
10.0000 mg | ORAL_TABLET | Freq: Every day | ORAL | Status: DC
  Administered 2019-08-17 – 2019-08-18 (×2): 10 mg via ORAL
  Filled 2019-08-17 (×2): qty 1

## 2019-08-17 NOTE — Progress Notes (Signed)
*  PRELIMINARY RESULTS* Echocardiogram 2D Echocardiogram has been performed.  [name] [name] 08/17/2019, 11:03 AM

## 2019-08-17 NOTE — Progress Notes (Signed)
Patient removed oxygen and was still over 98% on room air. Heart rate increased to 110 while transferring from bed to chair. Sat in chair for 2 hours

## 2019-08-17 NOTE — Progress Notes (Signed)
PROLONGED SERVICE CARE NOTE   08/17/2019 10:08 AM  [name] [name] was seen and examined.  The H&P by the admitting provider, orders, imaging was reviewed.  Please see new orders.  Will continue to follow.   Pt is being taken off IV cardizem and started metoprolol 25 mg BID with plans to further titrate over day based on HR response.  At this time, HR is controlled.  I recalculated CHADVASC2 score and at this time with what we currently know he is a 1.  We are still waiting for his echocardiogram to be done.    Vitals:   08/17/19 0751 08/17/19 0800  BP:  118/65  Pulse:  96  Resp:  (!) 21  Temp:    SpO2: 96% 96%    Results for orders placed or performed during the hospital encounter of 08/16/19  Blood Culture (routine x 2)   Specimen: Right Antecubital; Blood  Result Value Ref Range   Specimen Description RIGHT ANTECUBITAL    Special Requests      BOTTLES DRAWN AEROBIC AND ANAEROBIC Blood Culture results may not be optimal due to an excessive volume of blood received in culture bottles   Culture      NO GROWTH < 12 HOURS Performed at [facility], [address] [place], [place] [address]    Report Status PENDING   Blood Culture (routine x 2)   Specimen: Left Antecubital; Blood  Result Value Ref Range   Specimen Description LEFT ANTECUBITAL    Special Requests      BOTTLES DRAWN AEROBIC AND ANAEROBIC Blood Culture results may not be optimal due to an excessive volume of blood received in culture bottles   Culture      NO GROWTH < 12 HOURS Performed at [facility], [address], [place], [place] [address]    Report Status PENDING   MRSA PCR Screening   Specimen: Nasal Mucosa; Nasopharyngeal  Result Value Ref Range   MRSA by PCR POSITIVE (A) NEGATIVE  Lactic acid, plasma  Result Value Ref Range   Lactic Acid, Venous 1.7 0.5 - 1.9 mmol/L  Lactic acid, plasma  Result Value Ref Range   Lactic Acid, Venous 1.2 0.5 - 1.9 mmol/L  CBC WITH DIFFERENTIAL  Result Value Ref Range   WBC 13.7 (H) 4.0 - 10.5 K/uL   RBC 5.53 4.22 - 5.81 MIL/uL   Hemoglobin 15.7 13.0 - 17.0 g/dL   HCT 36.6 44.0 - 34.7 %   MCV 88.8 80.0 - 100.0 fL   MCH 28.4 26.0 - 34.0 pg   MCHC 32.0 30.0 - 36.0 g/dL   RDW 42.5 (H) 95.6 - 38.7 %   Platelets 245 150 - 400 K/uL   nRBC 0.0 0.0 - 0.2 %   Neutrophils Relative % 85 %   Neutro Abs 11.5 (H) 1.7 - 7.7 K/uL   Lymphocytes Relative 7 %   Lymphs Abs 1.0 0.7 - 4.0 K/uL   Monocytes Relative 7 %   Monocytes Absolute 1.0 0.1 - 1.0 K/uL   Eosinophils Relative 0 %   Eosinophils Absolute 0.0 0.0 - 0.5 K/uL   Basophils Relative 0 %   Basophils Absolute 0.0 0.0 - 0.1 K/uL   Immature Granulocytes 1 %   Abs Immature Granulocytes 0.08 (H) 0.00 - 0.07 K/uL  Comprehensive metabolic panel  Result Value Ref Range   Sodium 133 (L) 135 - 145 mmol/L   Potassium 3.9 3.5 - 5.1 mmol/L   Chloride 99 98 - 111 mmol/L  CO2 23 22 - 32 mmol/L   Glucose, Bld 116 (H) 70 - 99 mg/dL   BUN 22 (H) 6 - 20 mg/dL   Creatinine, Ser 1.42 (H) 0.61 - 1.24 mg/dL   Calcium 8.3 (L) 8.9 - 10.3 mg/dL   Total Protein 7.3 6.5 - 8.1 g/dL   Albumin 3.4 (L) 3.5 - 5.0 g/dL   AST 25 15 - 41 U/L   ALT 33 0 - 44 U/L   Alkaline Phosphatase 74 38 - 126 U/L   Total Bilirubin 1.1 0.3 - 1.2 mg/dL   GFR calc non Af Amer 54 (L) >60 mL/min   GFR calc Af Amer >60 >60 mL/min   Anion gap 11 5 - 15  D-dimer, quantitative  Result Value Ref Range   D-Dimer, Quant 0.46 0.00 - 0.50 ug/mL-FEU  Procalcitonin  Result Value Ref Range   Procalcitonin 0.17 ng/mL  Lactate dehydrogenase  Result Value Ref Range   LDH 251 (H) 98 - 192 U/L  Ferritin  Result Value Ref Range   Ferritin 320 24 - 336 ng/mL  Triglycerides  Result Value Ref Range   Triglycerides 106 <150 mg/dL  Fibrinogen  Result Value Ref Range   Fibrinogen 727 (H) 210 - 475 mg/dL  C-reactive protein  Result Value Ref Range   CRP 15.8 (H) <1.0 mg/dL  CBC  Result Value Ref Range   WBC 11.1 (H) 4.0 - 10.5 K/uL   RBC 5.35 4.22 - 5.81  MIL/uL   Hemoglobin 15.6 13.0 - 17.0 g/dL   HCT 47.7 39.0 - 52.0 %   MCV 89.2 80.0 - 100.0 fL   MCH 29.2 26.0 - 34.0 pg   MCHC 32.7 30.0 - 36.0 g/dL   RDW 18.0 (H) 11.5 - 15.5 %   Platelets 226 150 - 400 K/uL   nRBC 0.0 0.0 - 0.2 %  Creatinine, serum  Result Value Ref Range   Creatinine, Ser 1.22 0.61 - 1.24 mg/dL   GFR calc non Af Amer >60 >60 mL/min   GFR calc Af Amer >60 >60 mL/min  Lipid panel  Result Value Ref Range   Cholesterol 144 0 - 200 mg/dL   Triglycerides 92 <150 mg/dL   HDL 32 (L) >40 mg/dL   Total CHOL/HDL Ratio 4.5 RATIO   VLDL 18 0 - 40 mg/dL   LDL Cholesterol 94 0 - 99 mg/dL  ABO/Rh  Result Value Ref Range   ABO/RH(D)      O POS Performed at [facility], [address] [place], [place] [address]   Troponin I (High Sensitivity)  Result Value Ref Range   Troponin I (High Sensitivity) 16 <18 ng/L  Troponin I (High Sensitivity)  Result Value Ref Range   Troponin I (High Sensitivity) 13 <18 ng/L   Time Spent: 36 minutes   Murvin Natal, MD [facility]   08/16/2019  7:48 PM How to contact the [facility] Attending or Consulting provider [place] or covering provider during after hours 7P -7A, for this patient?  1. Check the care team in [facility] and look for a) attending/consulting TRH provider listed and b) the [facility] team listed 2. Log into [email or web address] and use [place]'s universal password to access. If you do not have the password, please contact the hospital operator. 3. Locate the [facility] provider you are looking for under [facility] and page to a number that you can be directly reached. 4. If you still have difficulty reaching the provider, please page the [facility] (Director  on Call) for the Hospitalists listed on amion for assistance.

## 2019-08-17 NOTE — Progress Notes (Signed)
08/17/2019 2:47 PM   Echocardiogram Results  IMPRESSIONS  1. Left ventricular ejection fraction, by estimation, is 45 to 50%. The left ventricle has mildly decreased function. The left ventricle  demonstrates global hypokinesis. There is mild concentric left ventricular hypertrophy. Left ventricular diastolic  function could not be evaluated. Left ventricular diastolic function could not be evaluated.  2. Right ventricular systolic function is normal. The right ventricular size is normal.  3. The mitral valve is normal in structure and function. Trivial mitral valve regurgitation. No evidence of mitral stenosis.  4. The aortic valve is tricuspid. Aortic valve regurgitation is not visualized. No aortic stenosis is present.  5. The inferior vena cava is normal in size with greater than 50% respiratory variability, suggesting right atrial pressure of 3 mmHg.   Given these findings, patient's CHA2DS2-VASc score increases to 2 which makes him eligible for full anticoagulation.  I will ask the or the pharmacy to dose apixaban to start full anticoagulation for stroke prevention.   [name] Manuel MD  How to contact the [facility] Attending or Consulting provider 7A - 7P or covering provider during after hours 7P -7A, for this patient?  1. Check the care team in [facility] and look for a) attending/consulting TRH provider listed and b) the [facility] team listed 2. Log into [email or web address] and use [place]'s universal password to access. If you do not have the password, please contact the hospital operator. 3. Locate the [facility] provider you are looking for under [facility] and page to a number that you can be directly reached. 4. If you still have difficulty reaching the provider, please page the [facility] (Director on Call) for the Hospitalists listed on amion for assistance.

## 2019-08-17 NOTE — Progress Notes (Signed)
Pt HR sustaining between 120s-130s at rest currently in afib. MD notified. Awaiting orders.

## 2019-08-17 NOTE — H&P (Signed)
TRH H&P    Patient Demographics:    [name] [name] Underdown, is a 59 y.o. male  MRN: [id number]  DOB - 11/26/1960  Admit Date - 08/16/2019  Referring MD/NP/PA: Burgess AmorJulie Idol  Outpatient Primary MD for the patient is Arlina RobesWinfield, Albert Carl, MD  Patient coming from: Home  Chief complaint-shortness of breath   HPI:    [name] [name] Froio  is a 59 y.o. male, with history of hypertension who was diagnosed with COVID-19 infection on 08/13/2019, came to hospital with shortness of breath.  He also complained of nausea, vomiting and diarrhea.  His O2 sats were 96% on room air.  Chest x-ray was unremarkable.  In the ED patient was initially in normal sinus rhythm and his rhythm converted to atrial fibrillation with RVR. Patient denied any palpitations or chest pain. No previous history of A. Fib. Denies fever or chills. Denies abdominal pain or dysuria Patient started on Cardizem gtt.    Review of systems:    In addition to the HPI above,    All other systems reviewed and are negative.    Past History of the following :    Past Medical History:  Diagnosis Date  . Asthma   . Hyperlipemia   . Hypertension   . Immune deficiency disorder (HCC)   . Kidney stone   . Obesity    BMI 42.6  . Psoriasis   . Seasonal allergies   . Sleep apnea       Past Surgical History:  Procedure Laterality Date  . NOSE SURGERY    . VIDEO ASSISTED THORACOSCOPY (VATS)/DECORTICATION Right 03/20/2014   Procedure: VIDEO ASSISTED THORACOSCOPY (VATS)/DECORTICATION;  Surgeon: Kerin PernaPeter Van Trigt, MD;  [facility];  Service: Thoracic;  Laterality: Right;      Social History:      Social History   Tobacco Use  . Smoking status: Never Smoker  . Smokeless tobacco: Never Used  Substance Use Topics  . Alcohol use: No       Family History :   Patient's mother had heart problems/Atrial fibrillation   Home Medications:   Prior to Admission  medications   Medication Sig Start Date End Date Taking? Authorizing Provider  amoxicillin (AMOXIL) 500 MG capsule Take 500 mg by mouth 3 (three) times daily. Started 05/06/14    [provider]  amoxicillin-clavulanate (AUGMENTIN) 875-125 MG tablet Take 1 tablet by mouth 2 (two) times daily. One po bid x 7 days if symptoms not improving by Thursday. 08/15/19   Mesner, Barbara CowerJason, MD  atorvastatin (LIPITOR) 80 MG tablet Take 80 mg by mouth daily. 04/18/19   [provider]  azelastine (ASTELIN) 0.1 % nasal spray Place 1 spray into both nostrils 2 (two) times daily. 07/07/19   [provider]  azithromycin (ZITHROMAX) 250 MG tablet Take by mouth as directed. 08/16/19   [provider]  dextromethorphan-guaiFENesin (MUCINEX DM) 30-600 MG 12hr tablet Take 1 tablet by mouth 2 (two) times daily as needed for cough. 08/13/19   Mesner, Barbara CowerJason, MD  Fluticasone-Salmeterol (ADVAIR) 250-50 MCG/DOSE AEPB Inhale 1  puff into the lungs 2 (two) times daily.    [provider]  folic acid (FOLVITE) 1 MG tablet Take 1 mg by mouth daily. 07/25/19   [provider]  HYDROcodone-acetaminophen (NORCO/VICODIN) 5-325 MG per tablet Take 1 tablet by mouth every 6 (six) hours as needed for moderate pain.    [provider]  hydroxychloroquine (PLAQUENIL) 200 MG tablet  08/16/19   [provider]  ibuprofen (ADVIL) 800 MG tablet Take 800 mg by mouth 3 (three) times daily. 08/13/19   [provider]  lisinopril (ZESTRIL) 20 MG tablet Take 20 mg by mouth daily. 06/14/19   [provider]  methotrexate (RHEUMATREX) 2.5 MG tablet Take 20 mg by mouth once a week. 07/25/19   [provider]  mometasone (NASONEX) 50 MCG/ACT nasal spray Place 2 sprays into the nose daily.    [provider]  montelukast (SINGULAIR) 10 MG tablet Take 10 mg by mouth at bedtime.    [provider]  omeprazole (PRILOSEC) 20 MG capsule Take 20 mg by mouth daily.     [provider]  ondansetron (ZOFRAN ODT) 4 MG disintegrating tablet 4mg  ODT q4 hours prn nausea/vomit 08/13/19   Mesner, Barbara Cower, MD  predniSONE (STERAPRED UNI-PAK 21 TAB) 5 MG (21) TBPK tablet Take 5-30 mg by mouth See admin instructions. For 6 days take as directed per package instructions [phone]) 08/16/19   [provider]  traMADol (ULTRAM) 50 MG tablet Take 50 mg by mouth 4 (four) times daily. 07/25/19   [provider]  zolpidem (AMBIEN) 10 MG tablet Take 10 mg by mouth at bedtime as needed for sleep.    [provider]     Allergies:    No Known Allergies   Physical Exam:   Vitals  Blood pressure 122/83, pulse (!) 108, temperature 98.2 F (36.8 C), temperature source Oral, resp. rate (!) 26, height 5\' 9"  (1.753 m), weight 136.1 kg, SpO2 95 %.  1.  General: Appears in no acute distress  2. Psychiatric: Alert, oriented x3, intact insight and judgment  3. Neurologic: Cranial nerves II through XII grossly intact, no focal deficit noted  4. HEENMT:  Atraumatic normocephalic, extraocular muscles are intact  5. Respiratory : Clear to auscultation bilaterally, no wheezing or crackles auscultated  6. Cardiovascular : S1-S2, irregular, no murmur auscultated  7. Gastrointestinal:  Abdomen is soft, nontender, no organomegaly      Data Review:    CBC Recent Labs  Lab 08/16/19 2120  WBC 13.7*  HGB 15.7  HCT 49.1  PLT 245  MCV 88.8  MCH 28.4  MCHC 32.0  RDW 17.9*  LYMPHSABS 1.0  MONOABS 1.0  EOSABS 0.0  BASOSABS 0.0   ------------------------------------------------------------------------------------------------------------------  Results for orders placed or performed during the hospital encounter of 08/16/19 (from the past 48 hour(s))  Blood Culture (routine x 2)     Status: None (Preliminary result)   Collection Time: 08/16/19  9:15 PM   Specimen: Right Antecubital; Blood  Result Value Ref Range   Specimen  Description RIGHT ANTECUBITAL    Special Requests      BOTTLES DRAWN AEROBIC AND ANAEROBIC Blood Culture results may not be optimal due to an excessive volume of blood received in culture bottles Performed at [facility], [address] [place], [place] [address]    Culture PENDING    Report Status PENDING   Lactic acid, plasma     Status: None   Collection Time: 08/16/19  9:20 PM  Result Value Ref Range   Lactic Acid, Venous 1.7 0.5 - 1.9 mmol/L    Comment: Performed at [facility] [place] [facility], [address], [place], [place] [address]  Blood Culture (routine x 2)     Status: None (Preliminary result)   Collection Time: 08/16/19  9:20 PM   Specimen: Left Antecubital; Blood  Result Value Ref Range   Specimen Description LEFT ANTECUBITAL    Special Requests      BOTTLES DRAWN AEROBIC AND ANAEROBIC Blood Culture results may not be optimal due to an excessive volume of blood received in culture bottles Performed at [facility], [address] [place], [place] [address]    Culture PENDING    Report Status PENDING   CBC WITH DIFFERENTIAL     Status: Abnormal   Collection Time: 08/16/19  9:20 PM  Result Value Ref Range   WBC 13.7 (H) 4.0 - 10.5 K/uL   RBC 5.53 4.22 - 5.81 MIL/uL   Hemoglobin 15.7 13.0 - 17.0 g/dL   HCT 56.3 14.9 - 70.2 %   MCV 88.8 80.0 - 100.0 fL   MCH 28.4 26.0 - 34.0 pg   MCHC 32.0 30.0 - 36.0 g/dL   RDW 63.7 (H) 85.8 - 85.0 %   Platelets 245 150 - 400 K/uL   nRBC 0.0 0.0 - 0.2 %   Neutrophils Relative % 85 %   Neutro Abs 11.5 (H) 1.7 - 7.7 K/uL   Lymphocytes Relative 7 %   Lymphs Abs 1.0 0.7 - 4.0 K/uL   Monocytes Relative 7 %   Monocytes Absolute 1.0 0.1 - 1.0 K/uL   Eosinophils Relative 0 %   Eosinophils Absolute 0.0 0.0 - 0.5 K/uL   Basophils Relative 0 %   Basophils Absolute 0.0 0.0 - 0.1 K/uL   Immature Granulocytes 1 %   Abs Immature Granulocytes 0.08 (H) 0.00 - 0.07 K/uL    Comment: Performed at [facility], [address], [place], [place] [address]    Comprehensive metabolic panel     Status: Abnormal   Collection Time: 08/16/19  9:20 PM  Result Value Ref Range   Sodium 133 (L) 135 - 145 mmol/L   Potassium 3.9 3.5 - 5.1 mmol/L   Chloride 99 98 - 111 mmol/L   CO2 23 22 - 32 mmol/L   Glucose, Bld 116 (H) 70 - 99 mg/dL    Comment: Glucose reference range applies only to samples taken after fasting for at least 8 hours.   BUN 22 (H) 6 - 20 mg/dL   Creatinine, Ser 2.87 (H) 0.61 - 1.24 mg/dL   Calcium 8.3 (L) 8.9 - 10.3 mg/dL   Total Protein 7.3 6.5 - 8.1 g/dL   Albumin 3.4 (L) 3.5 - 5.0 g/dL   AST 25 15 - 41 U/L   ALT 33 0 - 44 U/L   Alkaline Phosphatase 74 38 - 126 U/L   Total Bilirubin 1.1 0.3 - 1.2 mg/dL   GFR calc non Af Amer 54 (L) >60 mL/min   GFR calc Af Amer >60 >60 mL/min   Anion gap 11 5 - 15    Comment: Performed at [facility], [address], [place], [place] [address]  D-dimer, quantitative     Status: None   Collection Time: 08/16/19  9:20 PM  Result Value Ref Range   D-Dimer, Quant 0.46 0.00 - 0.50 ug/mL-FEU    Comment: (NOTE) At the manufacturer cut-off of 0.50 ug/mL FEU, this assay has been documented to exclude PE  with a sensitivity and negative predictive value of 97 to 99%.  At this time, this assay has not been approved by the FDA to exclude DVT/VTE. Results should be correlated with clinical presentation. Performed at [facility], [address], [place], [place] [address]   Procalcitonin     Status: None   Collection Time: 08/16/19  9:20 PM  Result Value Ref Range   Procalcitonin 0.17 ng/mL    Comment:        Interpretation: PCT (Procalcitonin) <= 0.5 ng/mL: Systemic infection (sepsis) is not likely. Local bacterial infection is possible. (NOTE)       Sepsis PCT Algorithm           Lower Respiratory Tract                                      Infection PCT Algorithm    ----------------------------     ----------------------------         PCT < 0.25 ng/mL                PCT < 0.10 ng/mL          Strongly encourage             Strongly discourage   discontinuation of antibiotics    initiation of antibiotics    ----------------------------     -----------------------------       PCT 0.25 - 0.50 ng/mL            PCT 0.10 - 0.25 ng/mL               OR       >80% decrease in PCT            Discourage initiation of                                            antibiotics      Encourage discontinuation           of antibiotics    ----------------------------     -----------------------------         PCT >= 0.50 ng/mL              PCT 0.26 - 0.50 ng/mL               AND        <80% decrease in PCT             Encourage initiation of                                             antibiotics       Encourage continuation           of antibiotics    ----------------------------     -----------------------------        PCT >= 0.50 ng/mL                  PCT > 0.50 ng/mL               AND         increase in PCT  Strongly encourage                                      initiation of antibiotics    Strongly encourage escalation           of antibiotics                                     -----------------------------                                           PCT <= 0.25 ng/mL                                                 OR                                        > 80% decrease in PCT                                     Discontinue / Do not initiate                                             antibiotics Performed at [facility], [address], [place], [place] [address]   Lactate dehydrogenase     Status: Abnormal   Collection Time: 08/16/19  9:20 PM  Result Value Ref Range   LDH 251 (H) 98 - 192 U/L    Comment: Performed at [facility], [address], [place], [place] [address]  Ferritin     Status: None   Collection Time: 08/16/19  9:20 PM  Result Value Ref Range   Ferritin 320 24 - 336 ng/mL    Comment: Performed at [facility], [address], [place], [place]  [address]  Triglycerides     Status: None   Collection Time: 08/16/19  9:20 PM  Result Value Ref Range   Triglycerides 106 <150 mg/dL    Comment: Performed at [facility], [address], [place], [place] [address]  Fibrinogen     Status: Abnormal   Collection Time: 08/16/19  9:20 PM  Result Value Ref Range   Fibrinogen 727 (H) 210 - 475 mg/dL    Comment: Performed at [facility], [address], [place], [place] [address]  C-reactive protein     Status: Abnormal   Collection Time: 08/16/19  9:20 PM  Result Value Ref Range   CRP 15.8 (H) <1.0 mg/dL    Comment: Performed at [facility], [address] [place], [place] [address]  Troponin I (High Sensitivity)     Status: None   Collection Time: 08/16/19  9:20 PM  Result Value Ref Range   Troponin I (High Sensitivity) 16 <18 ng/L    Comment: (NOTE) Elevated high sensitivity troponin I (hsTnI)  values and significant  changes across serial measurements may suggest ACS but many other  chronic and acute conditions are known to elevate hsTnI results.  Refer to the "Links" section for chest pain algorithms and additional  guidance. Performed at [facility], [address] [place], [place] [address]   Lactic acid, plasma     Status: None   Collection Time: 08/16/19 10:43 PM  Result Value Ref Range   Lactic Acid, Venous 1.2 0.5 - 1.9 mmol/L    Comment: Performed at [facility], [address] [place], [place] [address]  Troponin I (High Sensitivity)     Status: None   Collection Time: 08/16/19 10:43 PM  Result Value Ref Range   Troponin I (High Sensitivity) 13 <18 ng/L    Comment: (NOTE) Elevated high sensitivity troponin I (hsTnI) values and significant  changes across serial measurements may suggest ACS but many other  chronic and acute conditions are known to elevate hsTnI results.  Refer to the "Links" section for chest pain algorithms and additional  guidance. Performed at [facility], [address] [place], [place] [address]       Chemistries  Recent Labs  Lab 08/16/19 2120  NA 133*  K 3.9  CL 99  CO2 23  GLUCOSE 116*  BUN 22*  CREATININE 1.42*  CALCIUM 8.3*  AST 25  ALT 33  ALKPHOS 74  BILITOT 1.1   ------------------------------------------------------------------------------------------------------------------  ------------------------------------------------------------------------------------------------------------------ GFR: Estimated Creatinine Clearance: 78.7 mL/min (A) (by C-G formula based on SCr of 1.42 mg/dL (H)). Liver Function Tests: Recent Labs  Lab 08/16/19 2120  AST 25  ALT 33  ALKPHOS 74  BILITOT 1.1  PROT 7.3  ALBUMIN 3.4*   Lipid Profile: Recent Labs    08/16/19 2120  TRIG 106   Anemia Panel: Recent Labs    08/16/19 2120  FERRITIN 320    --------------------------------------------------------------------------------------------------------------- Urine analysis:    Component Value Date/Time   COLORURINE YELLOW 03/14/2014 0100   APPEARANCEUR CLEAR 03/14/2014 0100   LABSPEC 1.020 03/14/2014 0100   PHURINE 5.5 03/14/2014 0100   GLUCOSEU NEGATIVE 03/14/2014 0100   HGBUR NEGATIVE 03/14/2014 0100   BILIRUBINUR NEGATIVE 03/14/2014 0100   KETONESUR NEGATIVE 03/14/2014 0100   PROTEINUR NEGATIVE 03/14/2014 0100   UROBILINOGEN 0.2 03/14/2014 0100   NITRITE NEGATIVE 03/14/2014 0100   LEUKOCYTESUR NEGATIVE 03/14/2014 0100      Imaging Results:    DG Chest Port 1 View  Result Date: 08/16/2019 CLINICAL DATA:  Dyspnea. Additional history provided: Patient reports shortness of breath that began last night, diagnosed with COVID 08/13/2019, history of asthma and hypertension EXAM: PORTABLE CHEST 1 VIEW COMPARISON:  Chest radiograph 08/13/2019 FINDINGS: Heart size within normal limits. No evidence of airspace consolidation within the lungs. No evidence of pleural effusion or pneumothorax. No acute bony abnormality. IMPRESSION: No evidence of acute cardiopulmonary  abnormality. Electronically Signed   By: Jackey Loge DO   On: 08/16/2019 21:32    COVID-19 Labs  Recent Labs    08/16/19 2120  DDIMER 0.46  FERRITIN 320  LDH 251*  CRP 15.8*    Lab Results  Component Value Date   SARSCOV2NAA POSITIVE (A) 08/13/2019    My personal review of EKG: Rhythm atrial fibrillation   Assessment & Plan:    Active Problems:   Atrial fibrillation with RVR (HCC)   1. Atrial fibrillation with RVR-new onset, no previous history of A. Fib. CHA2DS2VASc score is 1.  Will start patient on Cardizem gtt.  Will hold full  dose anticoagulation, given  CHA2DS2VASc score of only 1.  Will obtain echocardiogram in a.m.  If it shows EF less than 40%, consider starting full dose heparin. 2. COVID-19 infection-patient's O2 sats are 96% on room air, chest x-ray shows no acute abnormality.  He is not a candidate for remdesivir at this time.  Will start Decadron 6 mg p.o. daily.  CRP is 15.8.  D-dimer 0.46.  Will obtain inflammatory markers in a.m.  Patient received outpatient bamlanivimab on 08/14/2019. 3. History of asthma-continue as needed albuterol, Dulera 4. Hypertension-we will hold lisinopril at this time.  Patient started on IV Cardizem as above.   DVT Prophylaxis-Heparin  AM Labs Ordered, also please review Full Orders  Family Communication: Admission, patients condition and plan of care including tests being ordered have been discussed with the patient  who indicate understanding and agree with the plan and Code Status.  Code Status: Full code  Admission status: Inpatient :The appropriate admission status for this patient is INPATIENT. Inpatient status is judged to be reasonable and necessary in order to provide the required intensity of service to ensure the patient's safety. The patient's presenting symptoms, physical exam findings, and initial radiographic and laboratory data in the context of their chronic comorbidities is felt to place them at high risk for  further clinical deterioration. Furthermore, it is not anticipated that the patient will be medically stable for discharge from the hospital within 2 midnights of admission. The following factors support the admission status of inpatient.     The patient's presenting symptoms include vomiting, diarrhea, shortness of breath The worrisome physical exam findings include irregular heart rate, new onset A. fib The initial radiographic and laboratory data are worrisome because of Covid 19+, new onset A. fib. The chronic co-morbidities include hypertension.       * I certify that at the point of admission it is my clinical judgment that the patient will require inpatient hospital care spanning beyond 2 midnights from the point of admission due to high intensity of service, high risk for further deterioration and high frequency of surveillance required.*  Time spent in minutes : 60 minutes   Eliah Marquard S Slayton Lubitz M.D

## 2019-08-17 NOTE — Progress Notes (Signed)
ANTICOAGULATION CONSULT NOTE - Initial Consult  Pharmacy Consult for Eliquis Indication: atrial fibrillation  No Known Allergies  Patient Measurements: Height: 5\' 9"  (175.3 cm) Weight: 300 lb (136.1 kg) IBW/kg (Calculated) : 70.7  Vital Signs: Temp Source: Oral (03/06 0739) BP: 119/79 (03/06 1130) Pulse Rate: 86 (03/06 1200)  Labs: Recent Labs    08/16/19 2120 08/16/19 2243 08/17/19 0606  HGB 15.7  --  15.6  HCT 49.1  --  47.7  PLT 245  --  226  CREATININE 1.42*  --  1.22  TROPONINIHS 16 13  --     Estimated Creatinine Clearance: 91.6 mL/min (by C-G formula based on SCr of 1.22 mg/dL).   Medical History: Past Medical History:  Diagnosis Date  . Asthma   . Hyperlipemia   . Hypertension   . Immune deficiency disorder (HCC)   . Kidney stone   . Obesity    BMI 42.6  . Psoriasis   . Seasonal allergies   . Sleep apnea     Medications:  Medications Prior to Admission  Medication Sig Dispense Refill Last Dose  . amoxicillin (AMOXIL) 500 MG capsule Take 500 mg by mouth 3 (three) times daily. Started 05/06/14     . amoxicillin-clavulanate (AUGMENTIN) 875-125 MG tablet Take 1 tablet by mouth 2 (two) times daily. One po bid x 7 days if symptoms not improving by Thursday. 14 tablet 0   . atorvastatin (LIPITOR) 80 MG tablet Take 80 mg by mouth daily.     Monday azelastine (ASTELIN) 0.1 % nasal spray Place 1 spray into both nostrils 2 (two) times daily.     Marland Kitchen azithromycin (ZITHROMAX) 250 MG tablet Take by mouth as directed.     Marland Kitchen dextromethorphan-guaiFENesin (MUCINEX DM) 30-600 MG 12hr tablet Take 1 tablet by mouth 2 (two) times daily as needed for cough. 30 tablet 0   . Fluticasone-Salmeterol (ADVAIR) 250-50 MCG/DOSE AEPB Inhale 1 puff into the lungs 2 (two) times daily.     . folic acid (FOLVITE) 1 MG tablet Take 1 mg by mouth daily.     Marland Kitchen HYDROcodone-acetaminophen (NORCO/VICODIN) 5-325 MG per tablet Take 1 tablet by mouth every 6 (six) hours as needed for moderate pain.      . hydroxychloroquine (PLAQUENIL) 200 MG tablet      . ibuprofen (ADVIL) 800 MG tablet Take 800 mg by mouth 3 (three) times daily.     Marland Kitchen lisinopril (ZESTRIL) 20 MG tablet Take 20 mg by mouth daily.     . methotrexate (RHEUMATREX) 2.5 MG tablet Take 20 mg by mouth once a week.     . mometasone (NASONEX) 50 MCG/ACT nasal spray Place 2 sprays into the nose daily.     . montelukast (SINGULAIR) 10 MG tablet Take 10 mg by mouth at bedtime.     Marland Kitchen omeprazole (PRILOSEC) 20 MG capsule Take 20 mg by mouth daily.     . ondansetron (ZOFRAN ODT) 4 MG disintegrating tablet 4mg  ODT q4 hours prn nausea/vomit 30 tablet 0   . predniSONE (STERAPRED UNI-PAK 21 TAB) 5 MG (21) TBPK tablet Take 5-30 mg by mouth See admin instructions. For 6 days take as directed per package instructions (6,5,4,3,2,1)     . traMADol (ULTRAM) 50 MG tablet Take 50 mg by mouth 4 (four) times daily.     Marland Kitchen zolpidem (AMBIEN) 10 MG tablet Take 10 mg by mouth at bedtime as needed for sleep.       Assessment: Patient presented to ED with SOB.  In th ED the patient was converted to atrial fibrillation with RVR. MD asked to start eliquis for new onset afib.  Goal of Therapy:  Monitor platelets by anticoagulation protocol: Yes   Plan:  eliquis 5mg  po bid Monitor for S/S of bleeding Educate on eliquis  Isac Sarna, BS Vena [place], BCPS Clinical Pharmacist Pager [phone] 08/17/2019,2:56 PM

## 2019-08-17 NOTE — Progress Notes (Signed)
2150: MD order IV metoprolol. Pt was given medication. 2155: Pt asked about CPAP. Adv patient that unfortunately because he is COVID+. We cannot provide it. Educated of hospital policy regarding CPAP & COVID. Pt became upset and raised his voice. Stated that we are not being helpful. He stated that he has not slept in 3 days. He needs to sleep. Reminded patient that he was given Ambien. Pt said that would not do anything without the CPAP. I set his O2 cannula up and asked him to wear it throughout the night. Pt yelled "Well I have no other choice, do I?" Explained to patient that it was out of my hands and all I could offer him at this time was a cannula.  2250: HR has been ranging between upper 80s and lower 100s.  Will continue to monitor HR.

## 2019-08-17 NOTE — ED Notes (Signed)
Noted rhythm change from sr to afib, notified dr. Sharl Ma

## 2019-08-18 DIAGNOSIS — Z7901 Long term (current) use of anticoagulants: Secondary | ICD-10-CM

## 2019-08-18 DIAGNOSIS — E785 Hyperlipidemia, unspecified: Secondary | ICD-10-CM

## 2019-08-18 DIAGNOSIS — G4733 Obstructive sleep apnea (adult) (pediatric): Secondary | ICD-10-CM

## 2019-08-18 DIAGNOSIS — E119 Type 2 diabetes mellitus without complications: Secondary | ICD-10-CM

## 2019-08-18 DIAGNOSIS — D689 Coagulation defect, unspecified: Secondary | ICD-10-CM

## 2019-08-18 LAB — CBC WITH DIFFERENTIAL/PLATELET
Abs Immature Granulocytes: 0.14 10*3/uL — ABNORMAL HIGH (ref 0.00–0.07)
Basophils Absolute: 0 10*3/uL (ref 0.0–0.1)
Basophils Relative: 0 %
Eosinophils Absolute: 0 10*3/uL (ref 0.0–0.5)
Eosinophils Relative: 0 %
HCT: 47.4 % (ref 39.0–52.0)
Hemoglobin: 15.1 g/dL (ref 13.0–17.0)
Immature Granulocytes: 1 %
Lymphocytes Relative: 4 %
Lymphs Abs: 0.8 10*3/uL (ref 0.7–4.0)
MCH: 28.3 pg (ref 26.0–34.0)
MCHC: 31.9 g/dL (ref 30.0–36.0)
MCV: 88.8 fL (ref 80.0–100.0)
Monocytes Absolute: 1.2 10*3/uL — ABNORMAL HIGH (ref 0.1–1.0)
Monocytes Relative: 6 %
Neutro Abs: 17.7 10*3/uL — ABNORMAL HIGH (ref 1.7–7.7)
Neutrophils Relative %: 89 %
Platelets: 275 10*3/uL (ref 150–400)
RBC: 5.34 MIL/uL (ref 4.22–5.81)
RDW: 17.5 % — ABNORMAL HIGH (ref 11.5–15.5)
WBC: 19.9 10*3/uL — ABNORMAL HIGH (ref 4.0–10.5)
nRBC: 0 % (ref 0.0–0.2)

## 2019-08-18 LAB — COMPREHENSIVE METABOLIC PANEL
ALT: 29 U/L (ref 0–44)
AST: 20 U/L (ref 15–41)
Albumin: 3.1 g/dL — ABNORMAL LOW (ref 3.5–5.0)
Alkaline Phosphatase: 64 U/L (ref 38–126)
Anion gap: 10 (ref 5–15)
BUN: 26 mg/dL — ABNORMAL HIGH (ref 6–20)
CO2: 23 mmol/L (ref 22–32)
Calcium: 8.7 mg/dL — ABNORMAL LOW (ref 8.9–10.3)
Chloride: 104 mmol/L (ref 98–111)
Creatinine, Ser: 1.16 mg/dL (ref 0.61–1.24)
GFR calc Af Amer: >60 mL/min (ref >60)
GFR calc non Af Amer: >60 mL/min (ref >60)
Glucose, Bld: 179 mg/dL — ABNORMAL HIGH (ref 70–99)
Potassium: 5.4 mmol/L — ABNORMAL HIGH (ref 3.5–5.1)
Sodium: 137 mmol/L (ref 135–145)
Total Bilirubin: 0.7 mg/dL (ref 0.3–1.2)
Total Protein: 6.9 g/dL (ref 6.5–8.1)

## 2019-08-18 LAB — C-REACTIVE PROTEIN: CRP: 8.8 mg/dL — ABNORMAL HIGH (ref <1.0)

## 2019-08-18 LAB — GLUCOSE, CAPILLARY
Glucose-Capillary: 174 mg/dL — ABNORMAL HIGH (ref 70–99)
Glucose-Capillary: 217 mg/dL — ABNORMAL HIGH (ref 70–99)
Glucose-Capillary: 167 mg/dL — ABNORMAL HIGH (ref 70–99)
Glucose-Capillary: 167 mg/dL — ABNORMAL HIGH (ref 70–99)

## 2019-08-18 LAB — FERRITIN: Ferritin: 328 ng/mL (ref 24–336)

## 2019-08-18 LAB — D-DIMER, QUANTITATIVE: D-Dimer, Quant: <0.27 ug/mL-FEU (ref 0.00–0.50)

## 2019-08-18 MED ORDER — METOPROLOL TARTRATE 25 MG PO TABS
37.5000 mg | ORAL_TABLET | Freq: Two times a day (BID) | ORAL | Status: DC
  Administered 2019-08-18 (×2): 37.5 mg via ORAL
  Filled 2019-08-18 (×2): qty 2

## 2019-08-18 MED ORDER — SODIUM ZIRCONIUM CYCLOSILICATE 10 G PO PACK
10.0000 g | PACK | Freq: Once | ORAL | Status: AC
  Administered 2019-08-18: 10 g via ORAL
  Filled 2019-08-18: qty 1

## 2019-08-18 MED ORDER — MUPIROCIN 2 % EX OINT
TOPICAL_OINTMENT | Freq: Two times a day (BID) | CUTANEOUS | Status: DC
  Filled 2019-08-18: qty 22

## 2019-08-18 MED ORDER — INSULIN ASPART 100 UNIT/ML ~~LOC~~ SOLN
0.0000 [IU] | Freq: Three times a day (TID) | SUBCUTANEOUS | Status: DC
  Administered 2019-08-18 (×2): 3 [IU] via SUBCUTANEOUS
  Administered 2019-08-18: 5 [IU] via SUBCUTANEOUS
  Administered 2019-08-19: 3 [IU] via SUBCUTANEOUS

## 2019-08-18 MED ORDER — BLOOD GLUCOSE METER KIT: PACK | 0 refills | Status: DC

## 2019-08-18 MED ORDER — LIVING WELL WITH DIABETES BOOK: Freq: Once | Status: AC

## 2019-08-18 NOTE — Progress Notes (Signed)
Patient has been able to give himself a bath today. Stool is still watery, brown color. In chair for about 3 hours today.

## 2019-08-18 NOTE — Progress Notes (Addendum)
Patient has request CPAP. Due to COVID rules we are still unable to furnish him CPAP. Hopefully this rule will change as it is an on going problem. !!!! It is above this therapist control.

## 2019-08-18 NOTE — Progress Notes (Signed)
PROGRESS NOTE [facility]   [name] [name]  QQP:[id number]  DOB: [birth year]  DOA: 08/16/2019 PCP: [name] Shad, MD   Brief Admission Hx: 59 y.o. male, with history of hypertension who was diagnosed with COVID-19 infection on 08/13/2019, came to hospital with shortness of breath.  He also complained of nausea, vomiting and diarrhea.  His O2 sats were 96% on room air.  Chest x-ray was unremarkable.  In the ED patient was initially in normal sinus rhythm and his rhythm converted to atrial fibrillation with RVR.  He was admitted to ICU with a Cardizem infusion.  MDM/Assessment & Plan:   1. Atrial fibrillation with RVR-this is a new finding and no prior history of atrial fibrillation.  His CHA2DS2-VASc score has changed since admission now that we have discovered that he has findings of congestive heart failure and type 2 diabetes mellitus.  He has been started on apixaban for full anticoagulation.  He is off the Cardizem infusion and has been started on metoprolol twice daily.  He continues to have heart rate elevation with elevation.  I have increased his metoprolol to 37.5 mg twice daily.  His TSH is within normal limits.  No significant severe valvular abnormalities noted on 2D echo. 2. Type 2 diabetes mellitus-patient has a hemoglobin A1c of 6.6%.  I have counseled him and I have ordered for living with diabetes mellitus booklet and blood glucose meter and testing supplies.  He has an ambulatory referral to diabetes and nutrition education center.  Supplemental sliding scale coverage ordered and CBG testing ordered while in hospital. 3. COVID-19 infection-he remains off oxygen at this time.  His chest x-ray did not show findings of infiltrate and he had not been started on remdesivir.  He remains on dexamethasone daily.  His inflammatory markers are declining significantly and most are within normal limits.  His vitamin D level is good. 4. OSA-patient is upset that he is not able to get CPAP  therapy at night due to hospital policy regarding [id number] positive patients.  He can be placed on oxygen at night while in the hospital. 5. Hyperkalemia-Lokelma dose ordered and repeat in a.m.    DVT prophylaxis: Apixaban Code Status: Full Family Communication: Patient updated at bedside and verbalized understanding, telephone call to spouse 08/18/19 Disposition Plan: Patient continues to need inpatient services he is being treated for rapid atrial fibrillation, new onset diabetes mellitus and congestive heart failure findings and may require subspecialty consultation if we cannot get the atrial fibrillation controlled  Consultants:    Procedures: IMPRESSIONS  1. Left ventricular ejection fraction, by estimation, is 45 to 50%. The left ventricle has mildly decreased function. The left ventricle  demonstrates global hypokinesis. There is mild concentric left ventricular hypertrophy. Left ventricular diastolic function could not be evaluated. Left ventricular diastolic function could not be evaluated.  2. Right ventricular systolic function is normal. The right ventricular size is normal.  3. The mitral valve is normal in structure and function. Trivial mitral valve regurgitation. No evidence of mitral stenosis.  4. The aortic valve is tricuspid. Aortic valve regurgitation is not visualized. No aortic stenosis is present.  5. The inferior vena cava is normal in size with greater than 50% respiratory variability, suggesting right atrial pressure of 3 mmHg.   Antimicrobials:    Subjective: Pt upset that he can't have his CPAP at night to sleep.  Hospital policy does not allow for Covid patients.   Objective: Vitals:   08/18/19 0600 08/18/19 0700 08/18/19  0800 08/18/19 0830  BP: (!) 132/95 96/86 119/87   Pulse: 68 (!) 40 79 (!) 103  Resp: (!) 25 (!) 27 (!) 24 (!) 29  Temp:      TempSrc:      SpO2: 91% 93% 94% (!) 89%  Weight:      Height:        Intake/Output Summary (Last 24  hours) at 08/18/2019 1205 Last data filed at 08/18/2019 0955 Gross per 24 hour  Intake 861.92 ml  Output --  Net 861.92 ml   Filed Weights   08/16/19 1916  Weight: 136.1 kg   REVIEW OF SYSTEMS  As per history otherwise all reviewed and reported negative  Exam:  General exam: awake, alert, NAD, cooperative.   Respiratory system: Clear. No increased work of breathing. Cardiovascular system: Irregularly irregular S1 & S2 heard. No JVD, murmurs, gallops, clicks or pedal edema. Gastrointestinal system: Abdomen is nondistended, soft and nontender. Normal bowel sounds heard. Central nervous system: Alert and oriented. No focal neurological deficits. Extremities: 1+ edema BLEs.  Data Reviewed: Basic Metabolic Panel: Recent Labs  Lab 08/16/19 2120 08/17/19 0606 08/18/19 0404  NA 133*  --  137  K 3.9  --  5.4*  CL 99  --  104  CO2 23  --  23  GLUCOSE 116*  --  179*  BUN 22*  --  26*  CREATININE 1.42* 1.22 1.16  CALCIUM 8.3*  --  8.7*   Liver Function Tests: Recent Labs  Lab 08/16/19 2120 08/18/19 0404  AST 25 20  ALT 33 29  ALKPHOS 74 64  BILITOT 1.1 0.7  PROT 7.3 6.9  ALBUMIN 3.4* 3.1*   No results for input(s): LIPASE, AMYLASE in the last 168 hours. No results for input(s): AMMONIA in the last 168 hours. CBC: Recent Labs  Lab 08/16/19 2120 08/17/19 0606 08/18/19 0404  WBC 13.7* 11.1* 19.9*  NEUTROABS 11.5*  --  17.7*  HGB 15.7 15.6 15.1  HCT 49.1 47.7 47.4  MCV 88.8 89.2 88.8  PLT 245 226 275   Cardiac Enzymes: No results for input(s): CKTOTAL, CKMB, CKMBINDEX, TROPONINI in the last 168 hours. CBG (last 3)  Recent Labs    08/18/19 0812 08/18/19 1104  GLUCAP 174* 217*   Recent Results (from the past 240 hour(s))  SARS CORONAVIRUS 2 (TAT 6-24 HRS) Nasopharyngeal Nasopharyngeal Swab     Status: Abnormal   Collection Time: 08/13/19  1:17 AM   Specimen: Nasopharyngeal Swab  Result Value Ref Range Status   SARS Coronavirus 2 POSITIVE (A) NEGATIVE Final     Comment: emailed L. Berdik RN 13:20 08/13/19 ([email or web address]) (NOTE) SARS-CoV-2 target nucleic acids are DETECTED. The SARS-CoV-2 RNA is generally detectable in upper and lower respiratory specimens during the acute phase of infection. Positive results are indicative of the presence of SARS-CoV-2 RNA. Clinical correlation with patient history and other diagnostic information is  necessary to determine patient infection status. Positive results do not rule out bacterial infection or co-infection with other viruses.  The expected result is Negative. Fact Sheet for Patients: HairSlick.no Fact Sheet for Healthcare Providers: quierodirigir.com This test is not yet approved or cleared by the [place] FDA and  has been authorized for detection and/or diagnosis of SARS-CoV-2 by FDA under an Emergency Use Authorization (EUA). This EUA will remain  in effect (meaning this test can be used) for the duration of the COVID-19 declaration unde r Section 564(b)(1) of the Act, 21 U.S.C. section 360bbb-3(b)(1), unless the authorization  is terminated or revoked sooner. Performed at [facility], [address] [place], [place] [address]   Blood Culture (routine x 2)     Status: None (Preliminary result)   Collection Time: 08/16/19  9:15 PM   Specimen: Right Antecubital; Blood  Result Value Ref Range Status   Specimen Description RIGHT ANTECUBITAL  Final   Special Requests   Final    BOTTLES DRAWN AEROBIC AND ANAEROBIC Blood Culture results may not be optimal due to an excessive volume of blood received in culture bottles   Culture   Final    NO GROWTH < 12 HOURS Performed at [facility], [address], [place], [place] [address]    Report Status PENDING  Incomplete  Blood Culture (routine x 2)     Status: None (Preliminary result)   Collection Time: 08/16/19  9:20 PM   Specimen: Left Antecubital; Blood  Result Value Ref Range Status    Specimen Description LEFT ANTECUBITAL  Final   Special Requests   Final    BOTTLES DRAWN AEROBIC AND ANAEROBIC Blood Culture results may not be optimal due to an excessive volume of blood received in culture bottles   Culture   Final    NO GROWTH < 12 HOURS Performed at [facility], [address] [place], [place] [address]    Report Status PENDING  Incomplete  MRSA PCR Screening     Status: Abnormal   Collection Time: 08/17/19  6:02 AM   Specimen: Nasal Mucosa; Nasopharyngeal  Result Value Ref Range Status   MRSA by PCR POSITIVE (A) NEGATIVE Final    Comment:        The GeneXpert MRSA Assay (FDA approved for NASAL specimens only), is one component of a comprehensive MRSA colonization surveillance program. It is not intended to diagnose MRSA infection nor to guide or monitor treatment for MRSA infections. CRITICAL RESULT CALLED TO, READ BACK BY AND VERIFIED WITH:  Joslyn Devon, RN @0919  08/17/2019 KAY Performed at [facility], [address] [place], [place]      Studies: DG Chest [facility] 1 View  Result Date: 08/16/2019 CLINICAL DATA:  Dyspnea. Additional history provided: Patient reports shortness of breath that began last night, diagnosed with COVID 08/13/2019, history of asthma and hypertension EXAM: PORTABLE CHEST 1 VIEW COMPARISON:  Chest radiograph 08/13/2019 FINDINGS: Heart size within normal limits. No evidence of airspace consolidation within the lungs. No evidence of pleural effusion or pneumothorax. No acute bony abnormality. IMPRESSION: No evidence of acute cardiopulmonary abnormality. Electronically Signed   By: 10/13/2019 DO   On: 08/16/2019 21:32   ECHOCARDIOGRAM COMPLETE  Result Date: 08/17/2019    ECHOCARDIOGRAM REPORT   Patient Name:   [name] [name] Date of Exam: 08/17/2019 Medical Rec #:  10/17/2019  Height:       69.0 in Accession #:    [id number] Weight:       300.0 lb Date of Birth:  [birth year]   BSA:          2.455 m Patient Age:    58 years   BP:            118/65 mmHg Patient Gender: M          HR:           107 bpm. Exam Location:  03/19/1962 Procedure: 2D Echo Indications:    Atrial Fibrillation 427.31 / I48.91  History:        Patient has no prior history  of Echocardiogram examinations.                 Arrythmias:Atrial Fibrillation; Risk Factors:Dyslipidemia,                 Hypertension and Non-Smoker. COVID POSITIVE.  Sonographer:    Jeryl Columbia RDCS (AE) Referring Phys: Gomez Cleverly LAMA IMPRESSIONS  1. Left ventricular ejection fraction, by estimation, is 45 to 50%. The left ventricle has mildly decreased function. The left ventricle demonstrates global hypokinesis. There is mild concentric left ventricular hypertrophy. Left ventricular diastolic function could not be evaluated. Left ventricular diastolic function could not be evaluated.  2. Right ventricular systolic function is normal. The right ventricular size is normal.  3. The mitral valve is normal in structure and function. Trivial mitral valve regurgitation. No evidence of mitral stenosis.  4. The aortic valve is tricuspid. Aortic valve regurgitation is not visualized. No aortic stenosis is present.  5. The inferior vena cava is normal in size with greater than 50% respiratory variability, suggesting right atrial pressure of 3 mmHg. Comparison(s): No prior Echocardiogram. FINDINGS  Left Ventricle: Left ventricular ejection fraction, by estimation, is 45 to 50%. The left ventricle has mildly decreased function. The left ventricle demonstrates global hypokinesis. The left ventricular internal cavity size was normal in size. There is  mild concentric left ventricular hypertrophy. Left ventricular diastolic function could not be evaluated due to atrial fibrillation. Left ventricular diastolic function could not be evaluated. Right Ventricle: The right ventricular size is normal. No increase in right ventricular wall thickness. Right ventricular systolic function is normal. Left Atrium: Left atrial  size was normal in size. Right Atrium: Right atrial size was normal in size. Pericardium: There is no evidence of pericardial effusion. Mitral Valve: The mitral valve is normal in structure and function. Trivial mitral valve regurgitation. No evidence of mitral valve stenosis. Tricuspid Valve: The tricuspid valve is normal in structure. Tricuspid valve regurgitation is trivial. No evidence of tricuspid stenosis. Aortic Valve: The aortic valve is tricuspid. Aortic valve regurgitation is not visualized. No aortic stenosis is present. Pulmonic Valve: The pulmonic valve was grossly normal. Pulmonic valve regurgitation is not visualized. No evidence of pulmonic stenosis. Aorta: The aortic root, ascending aorta and aortic arch are all structurally normal, with no evidence of dilitation or obstruction. Venous: The inferior vena cava is normal in size with greater than 50% respiratory variability, suggesting right atrial pressure of 3 mmHg. IAS/Shunts: The interatrial septum was not well visualized.  LEFT VENTRICLE PLAX 2D LVIDd:         3.85 cm Diastology LVIDs:         2.99 cm LV e' lateral:   11.00 cm/s LV PW:         1.23 cm LV E/e' lateral: 6.0 LV IVS:        1.50 cm LV e' medial:    11.70 cm/s                        LV E/e' medial:  5.6  RIGHT VENTRICLE RV S prime:     11.60 cm/s TAPSE (M-mode): 1.8 cm LEFT ATRIUM             Index       RIGHT ATRIUM           Index LA diam:        4.40 cm 1.79 cm/m  RA Area:     15.80 cm LA Vol (A2C):  38.1 ml 15.52 ml/m RA Volume:   46.00 ml  18.74 ml/m LA Vol (A4C):   46.2 ml 18.82 ml/m LA Biplane Vol: 46.3 ml 18.86 ml/m   AORTA Ao Root diam: 3.10 cm MITRAL VALVE MV Area (PHT): 2.50 cm MV Decel Time: 303 msec MV E velocity: 66.00 cm/s MV A velocity: 45.60 cm/s MV E/A ratio:  1.45 Jodelle Red MD Electronically signed by Jodelle Red MD Signature Date/Time: 08/17/2019/1:02:05 PM    Final      Scheduled Meds: . apixaban  5 mg Oral BID  . atorvastatin  80  mg Oral q1800  . Chlorhexidine Gluconate Cloth  6 each Topical Daily  . dexamethasone  6 mg Oral Q24H  . folic acid  1 mg Oral Daily  . insulin aspart  0-15 Units Subcutaneous TID WC  . living well with diabetes book   Does not apply Once  . mouth rinse  15 mL Mouth Rinse BID  . metoprolol tartrate  37.5 mg Oral BID  . mometasone-formoterol  2 puff Inhalation BID  . montelukast  10 mg Oral QHS  . mupirocin ointment   Nasal BID  . pantoprazole  40 mg Oral Daily   Continuous Infusions:  Principal Problem:   Atrial fibrillation with RVR (HCC) Active Problems:   COVID-19   Hypoxia   Hypertension   Sleep apnea   Hyperlipemia   Obesity   Immune deficiency disorder (HCC)   Type 2 diabetes mellitus (HCC)   Coagulopathy (HCC)   Long term current use of anticoagulant therapy  Critical Care Procedure Note Authorized and Performed by: Maryln Manuel MD  Total Critical Care time:  34 mins Due to a high probability of clinically significant, life threatening deterioration, the patient required my highest level of preparedness to intervene emergently and I personally spent this critical care time directly and personally managing the patient.  This critical care time included obtaining a history; examining the patient, pulse oximetry; ordering and review of studies; arranging urgent treatment with development of a management plan; evaluation of patient's response of treatment; frequent reassessment; and discussions with other providers.  This critical care time was performed to assess and manage the high probability of imminent and life threatening deterioration that could result in multi-organ failure.  It was exclusive of separately billable procedures and treating other patients and teaching time.     Standley Dakins, MD [facility] 08/18/2019, 12:05 PM    LOS: 1 day  How to contact the [facility] Attending or Consulting provider 7A - 7P or covering provider during after hours 7P -7A, for this  patient?  1. Check the care team in [facility] and look for a) attending/consulting [facility] provider listed and b) the [facility] team listed 2. Log into [email or web address] and use [facility]'s universal password to access. If you do not have the password, please contact the hospital operator. 3. Locate the [facility] provider you are looking for under [facility] and page to a number that you can be directly reached. 4. If you still have difficulty reaching the provider, please page the [facility] (Director on Call) for the Hospitalists listed on amion for assistance.

## 2019-08-19 ENCOUNTER — Encounter (HOSPITAL_COMMUNITY): Payer: Self-pay

## 2019-08-19 DIAGNOSIS — Z7901 Long term (current) use of anticoagulants: Secondary | ICD-10-CM

## 2019-08-19 DIAGNOSIS — I10 Essential (primary) hypertension: Secondary | ICD-10-CM

## 2019-08-19 LAB — COMPREHENSIVE METABOLIC PANEL
ALT: 29 U/L (ref 0–44)
AST: 19 U/L (ref 15–41)
Albumin: 3.1 g/dL — ABNORMAL LOW (ref 3.5–5.0)
Alkaline Phosphatase: 69 U/L (ref 38–126)
Anion gap: 10 (ref 5–15)
BUN: 30 mg/dL — ABNORMAL HIGH (ref 6–20)
CO2: 25 mmol/L (ref 22–32)
Calcium: 8.8 mg/dL — ABNORMAL LOW (ref 8.9–10.3)
Chloride: 104 mmol/L (ref 98–111)
Creatinine, Ser: 1.14 mg/dL (ref 0.61–1.24)
GFR calc Af Amer: >60 mL/min (ref >60)
GFR calc non Af Amer: >60 mL/min (ref >60)
Glucose, Bld: 183 mg/dL — ABNORMAL HIGH (ref 70–99)
Potassium: 4.3 mmol/L (ref 3.5–5.1)
Sodium: 139 mmol/L (ref 135–145)
Total Bilirubin: 0.9 mg/dL (ref 0.3–1.2)
Total Protein: 7 g/dL (ref 6.5–8.1)

## 2019-08-19 LAB — CBC WITH DIFFERENTIAL/PLATELET
Abs Immature Granulocytes: 0.17 10*3/uL — ABNORMAL HIGH (ref 0.00–0.07)
Basophils Absolute: 0.1 10*3/uL (ref 0.0–0.1)
Basophils Relative: 0 %
Eosinophils Absolute: 0 10*3/uL (ref 0.0–0.5)
Eosinophils Relative: 0 %
HCT: 48.7 % (ref 39.0–52.0)
Hemoglobin: 15.4 g/dL (ref 13.0–17.0)
Immature Granulocytes: 1 %
Lymphocytes Relative: 5 %
Lymphs Abs: 1 10*3/uL (ref 0.7–4.0)
MCH: 28.1 pg (ref 26.0–34.0)
MCHC: 31.6 g/dL (ref 30.0–36.0)
MCV: 88.7 fL (ref 80.0–100.0)
Monocytes Absolute: 1.4 10*3/uL — ABNORMAL HIGH (ref 0.1–1.0)
Monocytes Relative: 7 %
Neutro Abs: 17.9 10*3/uL — ABNORMAL HIGH (ref 1.7–7.7)
Neutrophils Relative %: 87 %
Platelets: 312 10*3/uL (ref 150–400)
RBC: 5.49 MIL/uL (ref 4.22–5.81)
RDW: 17.3 % — ABNORMAL HIGH (ref 11.5–15.5)
WBC: 20.6 10*3/uL — ABNORMAL HIGH (ref 4.0–10.5)
nRBC: 0 % (ref 0.0–0.2)

## 2019-08-19 LAB — C-REACTIVE PROTEIN: CRP: 4.5 mg/dL — ABNORMAL HIGH (ref <1.0)

## 2019-08-19 LAB — GLUCOSE, CAPILLARY: Glucose-Capillary: 162 mg/dL — ABNORMAL HIGH (ref 70–99)

## 2019-08-19 MED ORDER — DEXAMETHASONE 6 MG PO TABS: 6.0000 mg | ORAL_TABLET | Freq: Every day | ORAL | 0 refills | Status: AC

## 2019-08-19 MED ORDER — OMEPRAZOLE 40 MG PO CPDR: 40.0000 mg | DELAYED_RELEASE_CAPSULE | Freq: Every day | ORAL | 1 refills | Status: DC

## 2019-08-19 MED ORDER — METOPROLOL TARTRATE 50 MG PO TABS
50.0000 mg | ORAL_TABLET | Freq: Two times a day (BID) | ORAL | Status: DC
  Administered 2019-08-19: 50 mg via ORAL
  Filled 2019-08-19: qty 1

## 2019-08-19 MED ORDER — APIXABAN 5 MG PO TABS: 5.0000 mg | ORAL_TABLET | Freq: Two times a day (BID) | ORAL | 1 refills | Status: DC

## 2019-08-19 MED ORDER — METOPROLOL TARTRATE 50 MG PO TABS: 50.0000 mg | ORAL_TABLET | Freq: Two times a day (BID) | ORAL | 1 refills | Status: DC

## 2019-08-19 MED ORDER — LISINOPRIL 5 MG PO TABS: 5.0000 mg | ORAL_TABLET | Freq: Every day | ORAL | 1 refills | Status: DC

## 2019-08-19 NOTE — Progress Notes (Signed)
Nsg Discharge Note  Admit Date:  08/16/2019 Discharge date: 08/19/2019   Patrica Duel to be D/C'd home per MD order.  AVS completed.  Copy for chart, and copy for patient signed, and dated. Patient/caregiver able to verbalize understanding.  Discharge Medication: Allergies as of 08/19/2019   No Known Allergies     Medication List    STOP taking these medications   amoxicillin-clavulanate 875-125 MG tablet Commonly known as: Augmentin   lisinopril 20 MG tablet Commonly known as: ZESTRIL     TAKE these medications   apixaban 5 MG Tabs tablet Commonly known as: ELIQUIS Take 1 tablet (5 mg total) by mouth 2 (two) times daily.   atorvastatin 80 MG tablet Commonly known as: LIPITOR Take 80 mg by mouth daily.   azelastine 0.1 % nasal spray Commonly known as: ASTELIN Place 1 spray into both nostrils 2 (two) times daily.   blood glucose meter kit and supplies Dispense based on patient and insurance preference. Use up to four times daily as directed. (FOR ICD-10 E10.9, E11.9).   dexamethasone 6 MG tablet Commonly known as: DECADRON Take 1 tablet (6 mg total) by mouth daily with breakfast for 7 days. Start taking on: August 20, 2019   dextromethorphan-guaiFENesin 30-600 MG 12hr tablet Commonly known as: MUCINEX DM Take 1 tablet by mouth 2 (two) times daily as needed for cough.   Fluticasone-Salmeterol 250-50 MCG/DOSE Aepb Commonly known as: ADVAIR Inhale 1 puff into the lungs 2 (two) times daily.   folic acid 1 MG tablet Commonly known as: FOLVITE Take 1 mg by mouth daily.   methotrexate 2.5 MG tablet Commonly known as: RHEUMATREX Take 20 mg by mouth once a week.   metoprolol tartrate 50 MG tablet Commonly known as: LOPRESSOR Take 1 tablet (50 mg total) by mouth 2 (two) times daily.   mometasone 50 MCG/ACT nasal spray Commonly known as: NASONEX Place 2 sprays into the nose daily.   montelukast 10 MG tablet Commonly known as: SINGULAIR Take 10 mg by mouth at bedtime.   omeprazole 40 MG capsule Commonly known as: PRILOSEC Take 1 capsule (40 mg total) by mouth daily. What changed:   medication strength  how much to take   traMADol 50 MG tablet Commonly known as: ULTRAM Take 50 mg by mouth 4 (four) times daily.   zolpidem 10 MG tablet Commonly known as: AMBIEN Take 10 mg by mouth at bedtime as needed for sleep.       Discharge Assessment: Vitals:   08/19/19 1000 08/19/19 1021  BP:    Pulse: 94 (!) 101  Resp: 19 17  Temp:    SpO2: 94% 95%   Skin clean, dry and intact without evidence of skin break down, no evidence of skin tears noted. IV catheter discontinued intact. Site without signs and symptoms of complications - no redness or edema noted at insertion site, patient denies c/o pain - only slight tenderness at site.  Dressing with slight pressure applied.  D/c Instructions-Education: Discharge instructions given to patient/family with verbalized understanding. D/c education completed with patient/family including follow up instructions, medication list, d/c activities limitations if indicated, with other d/c instructions as indicated by MD - patient able to verbalize understanding, all questions fully answered. Patient instructed to return to ED, call 911, or call MD for any changes in condition.  Patient escorted via [place], and D/C home via private auto.  Carney Corners, RN 08/19/2019 11:26 AM

## 2019-08-19 NOTE — Discharge Instructions (Signed)
Please call and make an appointment with cardiology office in 2 weeks.    Bleeding Precautions When on Anticoagulant Therapy, Adult Anticoagulant therapy, also called blood thinner therapy, is medicine that helps to prevent and treat blood clots. The medicine works by stopping blood clots from forming or growing. Blood clots that form in your blood vessels can be dangerous. They can break loose and travel to the heart, lungs, or brain. This increases the risk of a heart attack, stroke, or blocked lung artery (pulmonary embolism). Anticoagulants also increase the risk of bleeding. Try to protect yourself from cuts and other injuries that can cause bleeding. It is important to take anticoagulants exactly as told by your health care provider. Why do I need to be on anticoagulant therapy? You may need this medicine if you are at risk of developing a blood clot. Conditions that increase your risk of a blood clot include:  Being born with heart disease or a heart malformation (congenital heart disease).  Developing heart disease.  Having had surgery, such as valve replacement.  Having had a serious accident or other type of severe injury (trauma).  Having certain types of cancer.  Having certain diseases that can increase blood clotting.  Having a high risk of stroke or heart attack.  Having atrial fibrillation (AF). What are the common anticoagulant medicines? There are several types of anticoagulant medicines. The most common types are:  Medicines that you take by mouth (oral medicines), such as: ? Warfarin. ? Novel oral anticoagulants (NOACs), such as:  Direct thrombin inhibitors (dabigatran).  Factor Xa inhibitors (apixaban, edoxaban, and rivaroxaban).  Injections, such as: ? Unfractionated heparin. ? Low molecular weight heparin. These anticoagulants work in different ways to prevent blood clots. They also have different risks and side effects. What do I need to remember while  on anticoagulant therapy? Taking anticoagulants  Take your medicine at the same time every day. If you forget to take your medicine, take it as soon as you remember. Do not double your dosage of medicine if you miss a whole day. Take your normal dose and call your health care provider.  Do not stop taking your medicine unless your health care provider approves. Stopping the medicine can increase your risk of developing a blood clot. Taking other medicines  Take over-the-counter and prescriptions medicines only as told by your health care provider.  Do not take over-the-counter NSAIDs, including aspirin and ibuprofen, while you are on anticoagulant therapy. These medicines increase your risk of dangerous bleeding.  Get approval from your health care provider before you start taking any new medicines, vitamins, or herbal products. Some of these could interfere with your therapy. General instructions  Keep all follow-up visits as told by your health care provider. This is important.  If you are pregnant or trying to get pregnant, talk with a health care provider about anticoagulants. Some of these medicines are not safe to take during pregnancy.  Tell all health care providers, including your dentist, that you are on anticoagulant therapy. It is especially important to tell providers before you have any surgery, medical procedures, or dental work done. What precautions should I take?   Be very careful when using knives, scissors, or other sharp objects.  Use an electric razor instead of a blade.  Do not use toothpicks.  Use a soft-bristled toothbrush. Brush your teeth gently.  Always wear shoes outdoors and wear slippers indoors.  Be careful when cutting your fingernails and toenails.  Place bath mats  in the bathroom. If possible, install handrails as well.  Wear gloves while you do yard work.  Wear your seat belt.  Prevent falls by removing loose rugs and extension cords from  areas where you walk. Use a cane or walker if you need it.  Avoid constipation by: ? Drinking enough fluid to keep your urine clear or pale yellow. ? Eating foods that are high in fiber, such as fresh fruits and vegetables, whole grains, and beans. ? Limiting foods that are high in fat and processed sugars, such as fried and sweet foods.  Do not play contact sports or participate in other activities that have a high risk for injury. What other precautions are important if on warfarin therapy? If you are taking a type of anticoagulant called warfarin, make sure you:  Work with a diet and nutrition specialist (dietitian) to make an eating plan. Do not make any sudden changes to your diet after you have started your eating plan.  Do not drink alcohol. It can interfere with your medicine and increase your risk of an injury that causes bleeding.  Get regular blood tests as told by your health care provider. What are some questions to ask my health care provider?  Why do I need anticoagulant therapy?  What is the best anticoagulant therapy for my condition?  How long will I need anticoagulant therapy?  What are the side effects of anticoagulant therapy?  When should I take my medicine? What should I do if I forget to take it?  Will I need to have regular blood tests?  Do I need to change my diet? Are there foods or drinks that I should avoid?  What activities are safe for me?  What should I do if I want to get pregnant? Contact a health care provider if:  You miss a dose of medicine: ? And you are not sure what to do. ? For more than one day.  You have: ? Menstrual bleeding that is heavier than normal. ? Bloody or brown urine. ? Easy bruising. ? Black and tarry stool or bright red stool. ? Side effects from your medicine.  You feel weak or dizzy.  You become pregnant. Get help right away if:  You have bleeding that will not stop within 20 minutes from: ? The  nose. ? The gums. ? A cut on the skin.  You have a severe headache or stomachache.  You vomit or cough up blood.  You fall or hit your head. Summary  Anticoagulant therapy, also called blood thinner therapy, is medicine that helps to prevent and treat blood clots.  Anticoagulants work in different ways to prevent blood clots. They also have different risks and side effects.  Talk with your health care provider about any precautions that you should take while on anticoagulant therapy. This information is not intended to replace advice given to you by your health care provider. Make sure you discuss any questions you have with your health care provider. Document Revised: 09/19/2018 Document Reviewed: 08/16/2016 Elsevier Patient Education  2020 Elsevier Inc.    Prevent the Spread of COVID-19 if You Are Sick If you are sick with COVID-19 or think you might have COVID-19, follow the steps below to care for yourself and to help protect other people in your home and community. Stay home except to get medical care.  Stay home. Most people with COVID-19 have mild illness and are able to recover at home without medical care. Do not leave  your home, except to get medical care. Do not visit public areas.  Take care of yourself. Get rest and stay hydrated. Take over-the-counter medicines, such as acetaminophen, to help you feel better.  Stay in touch with your doctor. Call before you get medical care. Be sure to get care if you have trouble breathing, or have any other emergency warning signs, or if you think it is an emergency.  Avoid public transportation, ride-sharing, or taxis. Separate yourself from other people and pets in your home.  As much as possible, stay in a specific room and away from other people and pets in your home. Also, you should use a separate bathroom, if available. If you need to be around other people or animals in or outside of the home, wear a mask. ? See COVID-19  and Animals if you have questions about USFirm.ch. ? Additional guidance is available for those living in close quarters. (http://www.turner-rogers.com/.html) and shared housing (TVStereos.ch). Monitor your symptoms.  Symptoms of COVID-19 include fever, cough, and shortness of breath but other symptoms may be present as well.  Follow care instructions from your healthcare provider and local health department. Your local health authorities will give instructions on checking your symptoms and reporting information. When to Seek Emergency Medical Attention Look for emergency warning signs* for COVID-19. If someone is showing any of these signs, seek emergency medical care immediately:  Trouble breathing  Persistent pain or pressure in the chest  New confusion  Bluish lips or face  Inability to wake or stay awake *This list is not all possible symptoms. Please call your medical provider for any other symptoms that are severe or concerning to you. Call 911 or call ahead to your local emergency facility: Notify the operator that you are seeking care for someone who has or may have COVID-19. Call ahead before visiting your doctor.  Call ahead. Many medical visits for routine care are being postponed or done by phone or telemedicine.  If you have a medical appointment that cannot be postponed, call your doctor's office, and tell them you have or may have COVID-19. If you are sick, wear a mask over your nose and mouth.  You should wear a mask over your nose and mouth if you must be around other people or animals, including pets (even at home).  You don't need to wear the mask if you are alone. If you can't put on a mask (because of trouble breathing for example), cover your coughs and sneezes in some other way. Try to  stay at least 6 feet away from other people. This will help protect the people around you.  Masks should not be placed on young children under age 54 years, anyone who has trouble breathing, or anyone who is not able to remove the mask without help. Note: During the COVID-19 pandemic, medical grade facemasks are reserved for healthcare workers and some first responders. You may need to make a mask using a scarf or bandana. Cover your coughs and sneezes.  Cover your mouth and nose with a tissue when you cough or sneeze.  Throw used tissues in a lined trash can.  Immediately wash your hands with soap and water for at least 20 seconds. If soap and water are not available, clean your hands with an alcohol-based hand sanitizer that contains at least 60% alcohol. Clean your hands often.  Wash your hands often with soap and water for at least 20 seconds. This is especially important after blowing your nose,  coughing, or sneezing; going to the bathroom; and before eating or preparing food.  Use hand sanitizer if soap and water are not available. Use an alcohol-based hand sanitizer with at least 60% alcohol, covering all surfaces of your hands and rubbing them together until they feel dry.  Soap and water are the best option, especially if your hands are visibly dirty.  Avoid touching your eyes, nose, and mouth with unwashed hands. Avoid sharing personal household items.  Do not share dishes, drinking glasses, cups, eating utensils, towels, or bedding with other people in your home.  Wash these items thoroughly after using them with soap and water or put them in the dishwasher. Clean all "high-touch" surfaces everyday.  Clean and disinfect high-touch surfaces in your "sick room" and bathroom. Let someone else clean and disinfect surfaces in common areas, but not your bedroom and bathroom.  If a caregiver or other person needs to clean and disinfect a sick person's bedroom or bathroom, they should  do so on an as-needed basis. The caregiver/other person should wear a mask and wait as long as possible after the sick person has used the bathroom. High-touch surfaces include phones, remote controls, counters, tabletops, doorknobs, bathroom fixtures, toilets, keyboards, tablets, and bedside tables.  Clean and disinfect areas that may have blood, stool, or body fluids on them.  Use household cleaners and disinfectants. Clean the area or item with soap and water or another detergent if it is dirty. Then use a household disinfectant. ? Be sure to follow the instructions on the label to ensure safe and effective use of the product. Many products recommend keeping the surface wet for several minutes to ensure germs are killed. Many also recommend precautions such as wearing gloves and making sure you have good ventilation during use of the product. ? Most EPA-registered household disinfectants should be effective. When you can be around others after you had or likely had COVID-19 When you can be around others (end home isolation) depends on different factors for different situations.  I think or know I had COVID-19, and I had symptoms ? You can be with others after  24 hours with no fever AND  Symptoms improved AND  10 days since symptoms first appeared ? Depending on your healthcare provider's advice and availability of testing, you might get tested to see if you still have COVID-19. If you will be tested, you can be around others when you have no fever, symptoms have improved, and you receive two negative test results in a row, at least 24 hours apart.  I tested positive for COVID-19 but had no symptoms ? If you continue to have no symptoms, you can be with others after:  10 days have passed since test ? Depending on your healthcare provider's advice and availability of testing, you might get tested to see if you still have COVID-19. If you will be tested, you can be around others after you  receive two negative test results in a row, at least 24 hours apart. ? If you develop symptoms after testing positive, follow the guidance above for "I think or know I had COVID, and I had symptoms." [email or web address] 01/22/2019 This information is not intended to replace advice given to you by your health care provider. Make sure you discuss any questions you have with your health care provider. Document Revised: 02/07/2019 Document Reviewed: 12/11/2018 Elsevier Patient Education  2020 Elsevier Inc.    COVID-19: Quarantine vs. Isolation QUARANTINE keeps someone who was in close  contact with someone who has COVID-19 away from others. If you had close contact with a person who has COVID-19  Stay home until 14 days after your last contact.  Check your temperature twice a day and watch for symptoms of COVID-19.  If possible, stay away from people who are at higher-risk for getting very sick from COVID-19. ISOLATION keeps someone who is sick or tested positive for COVID-19 without symptoms away from others, even in their own home. If you are sick and think or know you have COVID-19  Stay home until after ? At least 10 days since symptoms first appeared and ? At least 24 hours with no fever without fever-reducing medication and ? Symptoms have improved If you tested positive for COVID-19 but do not have symptoms  Stay home until after ? 10 days have passed since your positive test If you live with others, stay in a specific "sick room" or area and away from other people or animals, including pets. Use a separate bathroom, if available. [email or web address] 12/31/2018 This information is not intended to replace advice given to you by your health care provider. Make sure you discuss any questions you have with your health care provider. Document Revised: 05/16/2019 Document Reviewed: 05/16/2019 Elsevier Patient Education  2020 [employer].   COVID-19 Frequently Asked  Questions COVID-19 (coronavirus disease) is an infection that is caused by a large family of viruses. Some viruses cause illness in people and others cause illness in animals like camels, cats, and bats. In some cases, the viruses that cause illness in animals can spread to humans. Where did the coronavirus come from? In December 2019, [place] told the [employer] [facility]) of several cases of lung disease (human respiratory illness). These cases were linked to an open seafood and livestock market in the city of [place]. The link to the seafood and livestock market suggests that the virus may have spread from animals to humans. However, since that first outbreak in December, the virus has also been shown to spread from person to person. What is the name of the disease and the virus? Disease name Early on, this disease was called novel coronavirus. This is because scientists determined that the disease was caused by a new (novel) respiratory virus. The World Health Organization [facility]) has now named the disease COVID-19, or coronavirus disease. Virus name The virus that causes the disease is called severe acute respiratory syndrome coronavirus 2 (SARS-CoV-2). More information on disease and virus naming World Health Organization [facility]): www.who.int/emergencies/diseases/novel-coronavirus-2019/technical-guidance/naming-the-coronavirus-disease-(covid-2019)-and-the-virus-that-causes-it Who is at risk for complications from coronavirus disease? Some people may be at higher risk for complications from coronavirus disease. This includes older adults and people who have chronic diseases, such as heart disease, diabetes, and lung disease. If you are at higher risk for complications, take these extra precautions:  Stay home as much as possible.  Avoid social gatherings and travel.  Avoid close contact with others. Stay at least 6 ft (2 m) away from others, if possible.  Wash your hands often with soap  and water for at least 20 seconds.  Avoid touching your face, mouth, nose, or eyes.  Keep supplies on hand at home, such as food, medicine, and cleaning supplies.  If you must go out in public, wear a cloth face covering or face mask. Make sure your mask covers your nose and mouth. How does coronavirus disease spread? The virus that causes coronavirus disease spreads easily from person to person (is contagious). You may catch the virus  by:  Breathing in droplets from an infected person. Droplets can be spread by a person breathing, speaking, singing, coughing, or sneezing.  Touching something, like a table or a doorknob, that was exposed to the virus (contaminated) and then touching your mouth, nose, or eyes. Can I get the virus from touching surfaces or objects? There is still a lot that we do not know about the virus that causes coronavirus disease. Scientists are basing a lot of information on what they know about similar viruses, such as:  Viruses cannot generally survive on surfaces for long. They need a human body (host) to survive.  It is more likely that the virus is spread by close contact with people who are sick (direct contact), such as through: ? Shaking hands or hugging. ? Breathing in respiratory droplets that travel through the air. Droplets can be spread by a person breathing, speaking, singing, coughing, or sneezing.  It is less likely that the virus is spread when a person touches a surface or object that has the virus on it (indirect contact). The virus may be able to enter the body if the person touches a surface or object and then touches his or her face, eyes, nose, or mouth. Can a person spread the virus without having symptoms of the disease? It may be possible for the virus to spread before a person has symptoms of the disease, but this is most likely not the main way the virus is spreading. It is more likely for the virus to spread by being in close contact with  people who are sick and breathing in the respiratory droplets spread by a person breathing, speaking, singing, coughing, or sneezing. What are the symptoms of coronavirus disease? Symptoms vary from person to person and can range from mild to severe. Symptoms may include:  Fever or chills.  Cough.  Difficulty breathing or feeling short of breath.  Headaches, body aches, or muscle aches.  Runny or stuffy (congested) nose.  Sore throat.  New loss of taste or smell.  Nausea, vomiting, or diarrhea. These symptoms can appear anywhere from 2 to 14 days after you have been exposed to the virus. Some people may not have any symptoms. If you develop symptoms, call your health care provider. People with severe symptoms may need hospital care. Should I be tested for this virus? Your health care provider will decide whether to test you based on your symptoms, history of exposure, and your risk factors. How does a health care provider test for this virus? Health care providers will collect samples to send for testing. Samples may include:  Taking a swab of fluid from the back of your nose and throat, your nose, or your throat.  Taking fluid from the lungs by having you cough up mucus (sputum) into a sterile cup.  Taking a blood sample. Is there a treatment or vaccine for this virus? Currently, there is no vaccine to prevent coronavirus disease. Also, there are no medicines like antibiotics or antivirals to treat the virus. A person who becomes sick is given supportive care, which means rest and fluids. A person may also relieve his or her symptoms by using over-the-counter medicines that treat sneezing, coughing, and runny nose. These are the same medicines that a person takes for the common cold. If you develop symptoms, call your health care provider. People with severe symptoms may need hospital care. What can I do to protect myself and my family from this virus?  You can protect  yourself and your family by taking the same actions that you would take to prevent the spread of other viruses. Take the following actions:  Wash your hands often with soap and water for at least 20 seconds. If soap and water are not available, use alcohol-based hand sanitizer.  Avoid touching your face, mouth, nose, or eyes.  Cough or sneeze into a tissue, sleeve, or elbow. Do not cough or sneeze into your hand or the air. ? If you cough or sneeze into a tissue, throw it away immediately and wash your hands.  Disinfect objects and surfaces that you frequently touch every day.  Stay away from people who are sick.  Avoid going out in public, follow guidance from your state and local health authorities.  Avoid crowded indoor spaces. Stay at least 6 ft (2 m) away from others.  If you must go out in public, wear a cloth face covering or face mask. Make sure your mask covers your nose and mouth.  Stay home if you are sick, except to get medical care. Call your health care provider before you get medical care. Your health care provider will tell you how long to stay home.  Make sure your vaccines are up to date. Ask your health care provider what vaccines you need. What should I do if I need to travel? Follow travel recommendations from your local health authority, the CDC, and WHO. Travel information and advice  Centers for Disease Control and Prevention (CDC): GeminiCard.gl  World Health Organization [facility]): PreviewDomains.se Know the risks and take action to protect your health  You are at higher risk of getting coronavirus disease if you are traveling to areas with an outbreak or if you are exposed to travelers from areas with an outbreak.  Wash your hands often and practice good hygiene to lower the risk of catching or spreading the virus. What should I do if I am sick? General instructions  to stop the spread of infection  Wash your hands often with soap and water for at least 20 seconds. If soap and water are not available, use alcohol-based hand sanitizer.  Cough or sneeze into a tissue, sleeve, or elbow. Do not cough or sneeze into your hand or the air.  If you cough or sneeze into a tissue, throw it away immediately and wash your hands.  Stay home unless you must get medical care. Call your health care provider or local health authority before you get medical care.  Avoid public areas. Do not take public transportation, if possible.  If you can, wear a mask if you must go out of the house or if you are in close contact with someone who is not sick. Make sure your mask covers your nose and mouth. Keep your home clean  Disinfect objects and surfaces that are frequently touched every day. This may include: ? Counters and tables. ? Doorknobs and light switches. ? Sinks and faucets. ? Electronics such as phones, remote controls, keyboards, computers, and tablets.  Wash dishes in hot, soapy water or use a dishwasher. Air-dry your dishes.  Wash laundry in hot water. Prevent infecting other household members  Let healthy household members care for children and pets, if possible. If you have to care for children or pets, wash your hands often and wear a mask.  Sleep in a different bedroom or bed, if possible.  Do not share personal items, such as razors, toothbrushes, deodorant, combs, brushes, towels, and washcloths. Where  to find more information Centers for Disease Control and Prevention (CDC)  Information and news updates: CardRetirement.cz World Health Organization [employer])  Information and news updates: AffordableSalon.es  Coronavirus health topic: https://thompson-craig.com/  Questions and answers on COVID-19: kruiseway.com  Global tracker:  who.sprinklr.com American Academy of Pediatrics (AAP)  Information for families: www.healthychildren.org/English/health-issues/conditions/chest-lungs/Pages/2019-Novel-Coronavirus.aspx The coronavirus situation is changing rapidly. Check your local health authority website or the CDC and [facility] websites for updates and news. When should I contact a health care provider?  Contact your health care provider if you have symptoms of an infection, such as fever or cough, and you: ? Have been near anyone who is known to have coronavirus disease. ? Have come into contact with a person who is suspected to have coronavirus disease. ? Have traveled to an area where there is an outbreak of COVID-19. When should I get emergency medical care?  Get help right away by calling your local emergency services (911 in the [place] if you have: ? Trouble breathing. ? Pain or pressure in your chest. ? Confusion. ? Blue-tinged lips and fingernails. ? Difficulty waking from sleep. ? Symptoms that get worse. Let the emergency medical personnel know if you think you have coronavirus disease. Summary  A new respiratory virus is spreading from person to person and causing COVID-19 (coronavirus disease).  The virus that causes COVID-19 appears to spread easily. It spreads from one person to another through droplets from breathing, speaking, singing, coughing, or sneezing.  Older adults and those with chronic diseases are at higher risk of disease. If you are at higher risk for complications, take extra precautions.  There is currently no vaccine to prevent coronavirus disease. There are no medicines, such as antibiotics or antivirals, to treat the virus.  You can protect yourself and your family by washing your hands often, avoiding touching your face, and covering your coughs and sneezes. This information is not intended to replace advice given to you by your health care provider. Make sure you discuss any questions you  have with your health care provider. Document Revised: 03/29/2019 Document Reviewed: 09/25/2018 Elsevier Patient Education  2020 Elsevier Inc.    10 Things You Can Do to Manage Your COVID-19 Symptoms at Home If you have possible or confirmed COVID-19: 1. Stay home from work and school. And stay away from other public places. If you must go out, avoid using any kind of public transportation, ridesharing, or taxis. 2. Monitor your symptoms carefully. If your symptoms get worse, call your healthcare provider immediately. 3. Get rest and stay hydrated. 4. If you have a medical appointment, call the healthcare provider ahead of time and tell them that you have or may have COVID-19. 5. For medical emergencies, call 911 and notify the dispatch personnel that you have or may have COVID-19. 6. Cover your cough and sneezes with a tissue or use the inside of your elbow. 7. Wash your hands often with soap and water for at least 20 seconds or clean your hands with an alcohol-based hand sanitizer that contains at least 60% alcohol. 8. As much as possible, stay in a specific room and away from other people in your home. Also, you should use a separate bathroom, if available. If you need to be around other people in or outside of the home, wear a mask. 9. Avoid sharing personal items with other people in your household, like dishes, towels, and bedding. 10. Clean all surfaces that are touched often, like counters, tabletops, and doorknobs. Use household  cleaning sprays or wipes according to the label instructions. [employer]AmericaFlowers.co.uk 12/12/2018 This information is not intended to replace advice given to you by your health care provider. Make sure you discuss any questions you have with your health care provider. Document Revised: 05/16/2019 Document Reviewed: 05/16/2019 Elsevier Patient Education  2020 Elsevier Inc.  COVID-19: How to Protect Yourself and Others Know how it spreads  There is currently no  vaccine to prevent coronavirus disease 2019 (COVID-19).  The best way to prevent illness is to avoid being exposed to this virus.  The virus is thought to spread mainly from person-to-person. ? Between people who are in close contact with one another (within about 6 feet). ? Through respiratory droplets produced when an infected person coughs, sneezes or talks. ? These droplets can land in the mouths or noses of people who are nearby or possibly be inhaled into the lungs. ? COVID-19 may be spread by people who are not showing symptoms. Everyone should Clean your hands often  Wash your hands often with soap and water for at least 20 seconds especially after you have been in a public place, or after blowing your nose, coughing, or sneezing.  If soap and water are not readily available, use a hand sanitizer that contains at least 60% alcohol. Cover all surfaces of your hands and rub them together until they feel dry.  Avoid touching your eyes, nose, and mouth with unwashed hands. Avoid close contact  Limit contact with others as much as possible.  Avoid close contact with people who are sick.  Put distance between yourself and other people. ? Remember that some people without symptoms may be able to spread virus. ? This is especially important for people who are at higher risk of getting very RetroStamps.it Cover your mouth and nose with a mask when around others  You could spread COVID-19 to others even if you do not feel sick.  Everyone should wear a mask in public settings and when around people not living in their household, especially when social distancing is difficult to maintain. ? Masks should not be placed on young children under age 56, anyone who has trouble breathing, or is unconscious, incapacitated or otherwise unable to remove the mask without assistance.  The mask is meant to protect other people  in case you are infected.  Do NOT use a facemask meant for a Research scientist (physical sciences).  Continue to keep about 6 feet between yourself and others. The mask is not a substitute for social distancing. Cover coughs and sneezes  Always cover your mouth and nose with a tissue when you cough or sneeze or use the inside of your elbow.  Throw used tissues in the trash.  Immediately wash your hands with soap and water for at least 20 seconds. If soap and water are not readily available, clean your hands with a hand sanitizer that contains at least 60% alcohol. Clean and disinfect  Clean AND disinfect frequently touched surfaces daily. This includes tables, doorknobs, light switches, countertops, handles, desks, phones, keyboards, toilets, faucets, and sinks. ktimeonline.com  If surfaces are dirty, clean them: Use detergent or soap and water prior to disinfection.  Then, use a household disinfectant. You can see a list of EPA-registered household disinfectants here. [email or web address].co.uk 02/13/2019 This information is not intended to replace advice given to you by your health care provider. Make sure you discuss any questions you have with your health care provider. Document Revised: 02/21/2019 Document Reviewed: 12/20/2018 Elsevier Patient Education  2020  Elsevier Inc.   COVID-19: What Your Test Results Mean If you test positive for COVID-19 Take steps to help prevent the spread of COVID-19 Stay home.  Do not leave your home, except to get medical care. Do not visit public areas. Get rest and stay hydrated. Take over-the-counter medicines, such as acetaminophen, to help you feel better. Stay in touch with your doctor. Separate yourself from other people.  As much as possible, stay in a specific room and away from other people and pets in your home. If you test negative for COVID-19  You probably were not infected at the time your  sample was collected.  However, that does not mean you will not get sick.  It is possible that you were very early in your infection when your sample was collected and that you could test positive later. A negative test result does not mean you won't get sick later. [email or web address].co.[email or web address]/[email or web address] 11/10/2018 This information is not intended to replace advice given to you by your health care provider. Make sure you discuss any questions you have with your health care provider. Document Revised: 05/16/2019 Document Reviewed: 05/16/2019 Elsevier Patient Education  2020 [employer]

## 2019-08-19 NOTE — Progress Notes (Signed)
Pts BP cuff not going off at the moment- pt request he be left alone tonight since he has not gotten any rest the past 2 nights at the hospital. Pt stated he would call this RN if needed. VSS  Have been stable this shift. Will continue to monitor pt.

## 2019-08-19 NOTE — Discharge Summary (Addendum)
Physician Discharge Summary  [name] [name] CBJ:[id number] DOB: Sep 17, 1960 DOA: 08/16/2019  PCP: Andres Shad, MD  Admit date: 08/16/2019 Discharge date: 08/19/2019  Admitted From:  Home  Disposition:  Home   Recommendations for Outpatient Follow-up:  1. Follow up with PCP in 10 days 2. Home isolation for 10 days for Covid Infection 3. Establish care with [facility] Cardiology clinic 4. Please avoid taking NSAIDS, use tylenol    Discharge Condition: STABLE   CODE STATUS: FULL    Brief Hospitalization Summary: Please see all hospital notes, images, labs for full details of the hospitalization. ADMISSION HPI:  [name] [name]  is a 59 y.o. male, with history of hypertension who was diagnosed with COVID-19 infection on 08/13/2019, came to hospital with shortness of breath.  He also complained of nausea, vomiting and diarrhea.  His O2 sats were 96% on room air.  Chest x-ray was unremarkable.  In the ED patient was initially in normal sinus rhythm and his rhythm converted to atrial fibrillation with RVR. Patient denied any palpitations or chest pain. No previous history of A. Fib. Denies fever or chills.  Denies abdominal pain or dysuria.  Patient started on Cardizem gtt.  Brief Admission Hx: 59 y.o.male,with history of hypertension who was diagnosed with COVID-19 infection on 08/13/2019, came to hospital with shortness of breath. He also complained of nausea, vomiting and diarrhea.His O2 sats were 96% on room air. Chest x-ray was unremarkable. In the ED patient was initially in normal sinus rhythm and his rhythm converted to atrial fibrillation with RVR.  He was admitted to ICU with a Cardizem infusion.  MDM/Assessment & Plan:   1. Atrial fibrillation with RVR-this is a new finding and no prior history of atrial fibrillation.  His CHA2DS2-VASc score has changed since admission now that we have discovered that he has findings of congestive heart failure and type 2 diabetes mellitus.   He has been started on apixaban for full anticoagulation.  He is off the Cardizem infusion and has been started on metoprolol twice daily.  His dose has been increased to 50 mg BID.  He seems to be tolerating it well and his HR has remained in good control.   His TSH is within normal limits.  No significant severe valvular abnormalities noted on 2D echo. See full Echo report below.   2. Type 2 diabetes mellitus-patient has a hemoglobin A1c of 6.6%.  I have counseled him and I have ordered for living with diabetes mellitus booklet and blood glucose meter and testing supplies.  He has an ambulatory referral to diabetes and nutrition education center.  Supplemental sliding scale coverage ordered and CBG testing ordered while in hospital.  He wants to try diet and exercise to control his diabetes at this time.  Follow up with PCP strongly recommended.  3. HFrEF - He is asymptomatic at this time.  Continue lisinopril 5 mg daily.  We have made him appt to establish care with cardiology on September 19, 2019.  4. COVID-19 infection-he remains off oxygen at this time and having no SOB symptoms.  His chest x-ray did not show findings of infiltrate and he had not been started on remdesivir.  He remains on dexamethasone daily and will continue this for full 10 day course.  His inflammatory markers are declining significantly and most are within normal limits.  His vitamin D level is good. 5. OSA-patient is upset that he is not able to get CPAP therapy at night due to hospital policy regarding  COVID-19 positive patients.   6. Hyperkalemia-Lokelma dose ordered and repeat BMP shows resolution.      DVT prophylaxis: Apixaban Code Status: Full Family Communication: Patient updated at bedside and verbalized understanding, telephone call to spouse 08/18/19 Disposition Plan: Home   Consultants:    Procedures: IMPRESSIONS  1. Left ventricular ejection fraction, by estimation, is 45 to 50%. The left ventricle has mildly  decreased function. The left ventricle  demonstrates global hypokinesis. There is mild concentric left ventricular hypertrophy. Left ventricular diastolic function could not be evaluated. Left ventricular diastolic function could not be evaluated.  2. Right ventricular systolic function is normal. The right ventricular size is normal.  3. The mitral valve is normal in structure and function. Trivial mitral valve regurgitation. No evidence of mitral stenosis.  4. The aortic valve is tricuspid. Aortic valve regurgitation is not visualized. No aortic stenosis is present.  5. The inferior vena cava is normal in size with greater than 50% respiratory variability, suggesting right atrial pressure of 3 mmHg.   Discharge Diagnoses:  Principal Problem:   Atrial fibrillation with RVR ([place]) Active Problems:   COVID-19   Hypoxia   Hypertension   Sleep apnea   Hyperlipemia   Obesity   Immune deficiency disorder ([place])   Type 2 diabetes mellitus ([place])   Coagulopathy ([place])   Long term current use of anticoagulant therapy   Discharge Instructions: Discharge Instructions    Ambulatory referral to Nutrition and Diabetic Education   Complete by: As directed    Temperature monitoring   Complete by: Aug 19, 2019    After how many days would you like to receive a notification of this patient's flowsheet entries?: 1   MyChart COVID-19 home monitoring program   Complete by: Aug 20, 2019    Is the patient willing to use the [place] for home monitoring?: Yes     Allergies as of 08/19/2019   No Known Allergies     Medication List    STOP taking these medications   amoxicillin-clavulanate 875-125 MG tablet Commonly known as: Augmentin     TAKE these medications   apixaban 5 MG Tabs tablet Commonly known as: ELIQUIS Take 1 tablet (5 mg total) by mouth 2 (two) times daily.   atorvastatin 80 MG tablet Commonly known as: LIPITOR Take 80 mg by mouth daily.   azelastine 0.1 % nasal  spray Commonly known as: ASTELIN Place 1 spray into both nostrils 2 (two) times daily.   blood glucose meter kit and supplies Dispense based on patient and insurance preference. Use up to four times daily as directed. (FOR ICD-10 E10.9, E11.9).   dexamethasone 6 MG tablet Commonly known as: DECADRON Take 1 tablet (6 mg total) by mouth daily with breakfast for 7 days. Start taking on: August 20, 2019   dextromethorphan-guaiFENesin 30-600 MG 12hr tablet Commonly known as: MUCINEX DM Take 1 tablet by mouth 2 (two) times daily as needed for cough.   Fluticasone-Salmeterol 250-50 MCG/DOSE Aepb Commonly known as: ADVAIR Inhale 1 puff into the lungs 2 (two) times daily.   folic acid 1 MG tablet Commonly known as: FOLVITE Take 1 mg by mouth daily.   lisinopril 5 MG tablet Commonly known as: ZESTRIL Take 1 tablet (5 mg total) by mouth daily. Start taking on: August 20, 2019 What changed:   medication strength  how much to take   methotrexate 2.5 MG tablet Commonly known as: RHEUMATREX Take 20 mg by mouth once a week.  metoprolol tartrate 50 MG tablet Commonly known as: LOPRESSOR Take 1 tablet (50 mg total) by mouth 2 (two) times daily.   mometasone 50 MCG/ACT nasal spray Commonly known as: NASONEX Place 2 sprays into the nose daily.   montelukast 10 MG tablet Commonly known as: SINGULAIR Take 10 mg by mouth at bedtime.   omeprazole 40 MG capsule Commonly known as: PRILOSEC Take 1 capsule (40 mg total) by mouth daily. What changed:   medication strength  how much to take   traMADol 50 MG tablet Commonly known as: ULTRAM Take 50 mg by mouth 4 (four) times daily.   zolpidem 10 MG tablet Commonly known as: AMBIEN Take 10 mg by mouth at bedtime as needed for sleep.      Follow-up Information    Andres Shad, MD. Schedule an appointment as soon as possible for a visit in 10 day(s).   Specialty: Family Medicine Why: Hospital Follow Up  Contact  information: [address] [place] [address] [phone]          No Known Allergies Allergies as of 08/19/2019   No Known Allergies     Medication List    STOP taking these medications   amoxicillin-clavulanate 875-125 MG tablet Commonly known as: Augmentin     TAKE these medications   apixaban 5 MG Tabs tablet Commonly known as: ELIQUIS Take 1 tablet (5 mg total) by mouth 2 (two) times daily.   atorvastatin 80 MG tablet Commonly known as: LIPITOR Take 80 mg by mouth daily.   azelastine 0.1 % nasal spray Commonly known as: ASTELIN Place 1 spray into both nostrils 2 (two) times daily.   blood glucose meter kit and supplies Dispense based on patient and insurance preference. Use up to four times daily as directed. (FOR ICD-10 E10.9, E11.9).   dexamethasone 6 MG tablet Commonly known as: DECADRON Take 1 tablet (6 mg total) by mouth daily with breakfast for 7 days. Start taking on: August 20, 2019   dextromethorphan-guaiFENesin 30-600 MG 12hr tablet Commonly known as: MUCINEX DM Take 1 tablet by mouth 2 (two) times daily as needed for cough.   Fluticasone-Salmeterol 250-50 MCG/DOSE Aepb Commonly known as: ADVAIR Inhale 1 puff into the lungs 2 (two) times daily.   folic acid 1 MG tablet Commonly known as: FOLVITE Take 1 mg by mouth daily.   lisinopril 5 MG tablet Commonly known as: ZESTRIL Take 1 tablet (5 mg total) by mouth daily. Start taking on: August 20, 2019 What changed:   medication strength  how much to take   methotrexate 2.5 MG tablet Commonly known as: RHEUMATREX Take 20 mg by mouth once a week.   metoprolol tartrate 50 MG tablet Commonly known as: LOPRESSOR Take 1 tablet (50 mg total) by mouth 2 (two) times daily.   mometasone 50 MCG/ACT nasal spray Commonly known as: NASONEX Place 2 sprays into the nose daily.   montelukast 10 MG tablet Commonly known as: SINGULAIR Take 10 mg by mouth at bedtime.   omeprazole 40 MG  capsule Commonly known as: PRILOSEC Take 1 capsule (40 mg total) by mouth daily. What changed:   medication strength  how much to take   traMADol 50 MG tablet Commonly known as: ULTRAM Take 50 mg by mouth 4 (four) times daily.   zolpidem 10 MG tablet Commonly known as: AMBIEN Take 10 mg by mouth at bedtime as needed for sleep.       Procedures/Studies: [employer] Chest Port 1 View  Result  Date: 08/16/2019 CLINICAL DATA:  Dyspnea. Additional history provided: Patient reports shortness of breath that began last night, diagnosed with COVID 08/13/2019, history of asthma and hypertension EXAM: PORTABLE CHEST 1 VIEW COMPARISON:  Chest radiograph 08/13/2019 FINDINGS: Heart size within normal limits. No evidence of airspace consolidation within the lungs. No evidence of pleural effusion or pneumothorax. No acute bony abnormality. IMPRESSION: No evidence of acute cardiopulmonary abnormality. Electronically Signed   By: Kellie Simmering DO   On: 08/16/2019 21:32   DG Chest Port 1 View  Result Date: 08/13/2019 CLINICAL DATA:  Generalized body aches and cough EXAM: PORTABLE CHEST 1 VIEW COMPARISON:  March 17, 2015 FINDINGS: The heart size and mediastinal contours are within normal limits. Stable elevation of the right hemidiaphragm. Both lungs are clear. The visualized skeletal structures are unremarkable. IMPRESSION: No active disease. Electronically Signed   By: Prudencio Pair M.D.   On: 08/13/2019 01:47   ECHOCARDIOGRAM COMPLETE  Result Date: 08/17/2019    ECHOCARDIOGRAM REPORT   Patient Name:   [name] [name] Date of Exam: 08/17/2019 Medical Rec #:  [id number]  Height:       69.0 in Accession #:    [id number] Weight:       300.0 lb Date of Birth:  1961-01-30   BSA:          2.455 m Patient Age:    33 years   BP:           118/65 mmHg Patient Gender: M          HR:           107 bpm. Exam Location:  [facility] Procedure: 2D Echo Indications:    Atrial Fibrillation 427.31 / I48.91  History:        Patient has no prior  history of Echocardiogram examinations.                 Arrythmias:Atrial Fibrillation; Risk Factors:Dyslipidemia,                 Hypertension and Non-Smoker. COVID POSITIVE.  Sonographer:    Leavy Cella RDCS (AE) Referring Phys: [place]  1. Left ventricular ejection fraction, by estimation, is 45 to 50%. The left ventricle has mildly decreased function. The left ventricle demonstrates global hypokinesis. There is mild concentric left ventricular hypertrophy. Left ventricular diastolic function could not be evaluated. Left ventricular diastolic function could not be evaluated.  2. Right ventricular systolic function is normal. The right ventricular size is normal.  3. The mitral valve is normal in structure and function. Trivial mitral valve regurgitation. No evidence of mitral stenosis.  4. The aortic valve is tricuspid. Aortic valve regurgitation is not visualized. No aortic stenosis is present.  5. The inferior vena cava is normal in size with greater than 50% respiratory variability, suggesting right atrial pressure of 3 mmHg. Comparison(s): No prior Echocardiogram. FINDINGS  Left Ventricle: Left ventricular ejection fraction, by estimation, is 45 to 50%. The left ventricle has mildly decreased function. The left ventricle demonstrates global hypokinesis. The left ventricular internal cavity size was normal in size. There is  mild concentric left ventricular hypertrophy. Left ventricular diastolic function could not be evaluated due to atrial fibrillation. Left ventricular diastolic function could not be evaluated. Right Ventricle: The right ventricular size is normal. No increase in right ventricular wall thickness. Right ventricular systolic function is normal. Left Atrium: Left atrial size was normal in size. Right Atrium: Right atrial size was normal in  size. Pericardium: There is no evidence of pericardial effusion. Mitral Valve: The mitral valve is normal in structure and  function. Trivial mitral valve regurgitation. No evidence of mitral valve stenosis. Tricuspid Valve: The tricuspid valve is normal in structure. Tricuspid valve regurgitation is trivial. No evidence of tricuspid stenosis. Aortic Valve: The aortic valve is tricuspid. Aortic valve regurgitation is not visualized. No aortic stenosis is present. Pulmonic Valve: The pulmonic valve was grossly normal. Pulmonic valve regurgitation is not visualized. No evidence of pulmonic stenosis. Aorta: The aortic root, ascending aorta and aortic arch are all structurally normal, with no evidence of dilitation or obstruction. Venous: The inferior vena cava is normal in size with greater than 50% respiratory variability, suggesting right atrial pressure of 3 mmHg. IAS/Shunts: The interatrial septum was not well visualized.  LEFT VENTRICLE PLAX 2D LVIDd:         3.85 cm Diastology LVIDs:         2.99 cm LV e' lateral:   11.00 cm/s LV PW:         1.23 cm LV E/e' lateral: 6.0 LV IVS:        1.50 cm LV e' medial:    11.70 cm/s                        LV E/e' medial:  5.6  RIGHT VENTRICLE RV S prime:     11.60 cm/s TAPSE (M-mode): 1.8 cm LEFT ATRIUM             Index       RIGHT ATRIUM           Index LA diam:        4.40 cm 1.79 cm/m  RA Area:     15.80 cm LA Vol (A2C):   38.1 ml 15.52 ml/m RA Volume:   46.00 ml  18.74 ml/m LA Vol (A4C):   46.2 ml 18.82 ml/m LA Biplane Vol: 46.3 ml 18.86 ml/m   AORTA Ao Root diam: 3.10 cm MITRAL VALVE MV Area (PHT): 2.50 cm MV Decel Time: 303 msec MV E velocity: 66.00 cm/s MV A velocity: 45.60 cm/s MV E/A ratio:  1.45 Buford Dresser MD Electronically signed by Buford Dresser MD Signature Date/Time: 08/17/2019/1:02:05 PM    Final      Subjective: Patient reports that he is feeling a lot better.  He still does not require oxygen.  He has been ambulating without difficulty.  He would like to go home today.  He denies chest pain and denies shortness of breath and palpitations.  Discharge  Exam: Vitals:   08/19/19 1000 08/19/19 1021  BP:    Pulse: 94 (!) 101  Resp: 19 17  Temp:    SpO2: 94% 95%   Vitals:   08/19/19 0900 08/19/19 0922 08/19/19 1000 08/19/19 1021  BP:      Pulse: 91 91 94 (!) 101  Resp: 17 (!) '22 19 17  '$ Temp:      TempSrc:      SpO2: 95% 95% 94% 95%  Weight:      Height:       General: Pt is alert, awake, not in acute distress Cardiovascular: irregularly irregular, S1/S2 +, no rubs, no gallops Respiratory: CTA bilaterally, no wheezing, no rhonchi Abdominal: Soft, NT, ND, bowel sounds + Extremities: trace pretibial edema, no cyanosis   The results of significant diagnostics from this hospitalization (including imaging, microbiology, ancillary and laboratory) are listed below for reference.  Microbiology: Recent Results (from the past 240 hour(s))  SARS CORONAVIRUS 2 (TAT 6-24 HRS) Nasopharyngeal Nasopharyngeal Swab     Status: Abnormal   Collection Time: 08/13/19  1:17 AM   Specimen: Nasopharyngeal Swab  Result Value Ref Range Status   SARS Coronavirus 2 POSITIVE (A) NEGATIVE Final    Comment: emailed L. Berdik RN 13:20 08/13/19 ([email or web address]) (NOTE) SARS-CoV-2 target nucleic acids are DETECTED. The SARS-CoV-2 RNA is generally detectable in upper and lower respiratory specimens during the acute phase of infection. Positive results are indicative of the presence of SARS-CoV-2 RNA. Clinical correlation with patient history and other diagnostic information is  necessary to determine patient infection status. Positive results do not rule out bacterial infection or co-infection with other viruses.  The expected result is Negative. Fact Sheet for Patients: SugarRoll.be Fact Sheet for Healthcare Providers: https://www.woods-mathews.com/ This test is not yet approved or cleared by the [place] FDA and  has been authorized for detection and/or diagnosis of SARS-CoV-2 by FDA under an Emergency Use  Authorization (EUA). This EUA will remain  in effect (meaning this test can be used) for the duration of the COVID-19 declaration unde r Section 564(b)(1) of the Act, 21 U.S.C. section 360bbb-3(b)(1), unless the authorization is terminated or revoked sooner. Performed at [facility], [place] [address] [place], [place] [address]   Blood Culture (routine x 2)     Status: None (Preliminary result)   Collection Time: 08/16/19  9:15 PM   Specimen: Right Antecubital; Blood  Result Value Ref Range Status   Specimen Description RIGHT ANTECUBITAL  Final   Special Requests   Final    BOTTLES DRAWN AEROBIC AND ANAEROBIC Blood Culture results may not be optimal due to an excessive volume of blood received in culture bottles   Culture   Final    NO GROWTH 3 DAYS Performed at [facility], [address] [place], [place] [address]    Report Status PENDING  Incomplete  Blood Culture (routine x 2)     Status: None (Preliminary result)   Collection Time: 08/16/19  9:20 PM   Specimen: Left Antecubital; Blood  Result Value Ref Range Status   Specimen Description LEFT ANTECUBITAL  Final   Special Requests   Final    BOTTLES DRAWN AEROBIC AND ANAEROBIC Blood Culture results may not be optimal due to an excessive volume of blood received in culture bottles   Culture   Final    NO GROWTH 3 DAYS Performed at [facility], [address] [place], [place] [address]    Report Status PENDING  Incomplete  MRSA PCR Screening     Status: Abnormal   Collection Time: 08/17/19  6:02 AM   Specimen: Nasal Mucosa; Nasopharyngeal  Result Value Ref Range Status   MRSA by PCR POSITIVE (A) NEGATIVE Final    Comment:        The GeneXpert MRSA Assay (FDA approved for NASAL specimens only), is one component of a comprehensive MRSA colonization surveillance program. It is not intended to diagnose MRSA infection nor to guide or monitor treatment for MRSA infections. CRITICAL RESULT CALLED TO, READ BACK BY AND  VERIFIED WITH:  Lillie Fragmin, RN '@[id number]'$  08/17/2019 KAY Performed at [facility], [address], [place], [place] [address]      Labs: BNP (last 3 results) No results for input(s): BNP in the last 8760 hours. Basic Metabolic Panel: Recent Labs  Lab 08/16/19 2120 08/17/19 0606 08/18/19 0404 08/19/19 0439  NA 133*  --  137 139  K 3.9  --  5.4* 4.3  CL 99  --  104 104  CO2 23  --  23 25  GLUCOSE 116*  --  179* 183*  BUN 22*  --  26* 30*  CREATININE 1.42* 1.22 1.16 1.14  CALCIUM 8.3*  --  8.7* 8.8*   Liver Function Tests: Recent Labs  Lab 08/16/19 2120 08/18/19 0404 08/19/19 0439  AST '25 20 19  '$ ALT 33 29 29  ALKPHOS 74 64 69  BILITOT 1.1 0.7 0.9  PROT 7.3 6.9 7.0  ALBUMIN 3.4* 3.1* 3.1*   No results for input(s): LIPASE, AMYLASE in the last 168 hours. No results for input(s): AMMONIA in the last 168 hours. CBC: Recent Labs  Lab 08/16/19 2120 08/17/19 0606 08/18/19 0404 08/19/19 0439  WBC 13.7* 11.1* 19.9* 20.6*  NEUTROABS 11.5*  --  17.7* 17.9*  HGB 15.7 15.6 15.1 15.4  HCT 49.1 47.7 47.4 48.7  MCV 88.8 89.2 88.8 88.7  PLT 245 226 275 312   Cardiac Enzymes: No results for input(s): CKTOTAL, CKMB, CKMBINDEX, TROPONINI in the last 168 hours. BNP: Invalid input(s): POCBNP CBG: Recent Labs  Lab 08/18/19 0812 08/18/19 1104 08/18/19 1649 08/18/19 2122 08/19/19 0811  GLUCAP 174* 217* 167* 167* 162*   D-Dimer Recent Labs    08/16/19 2120 08/18/19 0404  DDIMER 0.46 <0.27   Hgb A1c Recent Labs    08/17/19 0606  HGBA1C 6.6*   Lipid Profile Recent Labs    08/16/19 2120 08/17/19 0606  CHOL  --  144  HDL  --  32*  LDLCALC  --  94  TRIG 106 92  CHOLHDL  --  4.5   Thyroid function studies Recent Labs    08/17/19 0921  TSH 0.772   Anemia work up Recent Labs    08/16/19 2120 08/18/19 0403  FERRITIN 320 328   Urinalysis    Component Value Date/Time   COLORURINE YELLOW 03/14/2014 0100   APPEARANCEUR CLEAR 03/14/2014 0100   LABSPEC  1.020 03/14/2014 0100   PHURINE 5.5 03/14/2014 0100   GLUCOSEU NEGATIVE 03/14/2014 0100   HGBUR NEGATIVE 03/14/2014 0100   BILIRUBINUR NEGATIVE 03/14/2014 0100   KETONESUR NEGATIVE 03/14/2014 0100   PROTEINUR NEGATIVE 03/14/2014 0100   UROBILINOGEN 0.2 03/14/2014 0100   NITRITE NEGATIVE 03/14/2014 0100   LEUKOCYTESUR NEGATIVE 03/14/2014 0100   Sepsis Labs Invalid input(s): PROCALCITONIN,  WBC,  LACTICIDVEN Microbiology Recent Results (from the past 240 hour(s))  SARS CORONAVIRUS 2 (TAT 6-24 HRS) Nasopharyngeal Nasopharyngeal Swab     Status: Abnormal   Collection Time: 08/13/19  1:17 AM   Specimen: Nasopharyngeal Swab  Result Value Ref Range Status   SARS Coronavirus 2 POSITIVE (A) NEGATIVE Final    Comment: emailed L. Berdik RN 13:20 08/13/19 ([email or web address]) (NOTE) SARS-CoV-2 target nucleic acids are DETECTED. The SARS-CoV-2 RNA is generally detectable in upper and lower respiratory specimens during the acute phase of infection. Positive results are indicative of the presence of SARS-CoV-2 RNA. Clinical correlation with patient history and other diagnostic information is  necessary to determine patient infection status. Positive results do not rule out bacterial infection or co-infection with other viruses.  The expected result is Negative. Fact Sheet for Patients: SugarRoll.be Fact Sheet for Healthcare Providers: https://www.woods-mathews.com/ This test is not yet approved or cleared by the [place] FDA and  has been authorized for detection and/or diagnosis of SARS-CoV-2 by FDA under an Emergency Use Authorization (EUA). This EUA will remain  in effect (meaning  this test can be used) for the duration of the COVID-19 declaration unde r Section 564(b)(1) of the Act, 21 U.S.C. section 360bbb-3(b)(1), unless the authorization is terminated or revoked sooner. Performed at [facility], [place] [address], [place], [place] [address]    Blood Culture (routine x 2)     Status: None (Preliminary result)   Collection Time: 08/16/19  9:15 PM   Specimen: Right Antecubital; Blood  Result Value Ref Range Status   Specimen Description RIGHT ANTECUBITAL  Final   Special Requests   Final    BOTTLES DRAWN AEROBIC AND ANAEROBIC Blood Culture results may not be optimal due to an excessive volume of blood received in culture bottles   Culture   Final    NO GROWTH 3 DAYS Performed at [facility], [address], [place], [place] [address]    Report Status PENDING  Incomplete  Blood Culture (routine x 2)     Status: None (Preliminary result)   Collection Time: 08/16/19  9:20 PM   Specimen: Left Antecubital; Blood  Result Value Ref Range Status   Specimen Description LEFT ANTECUBITAL  Final   Special Requests   Final    BOTTLES DRAWN AEROBIC AND ANAEROBIC Blood Culture results may not be optimal due to an excessive volume of blood received in culture bottles   Culture   Final    NO GROWTH 3 DAYS Performed at [facility], [address] [place] [place], [place] [address]    Report Status PENDING  Incomplete  MRSA PCR Screening     Status: Abnormal   Collection Time: 08/17/19  6:02 AM   Specimen: Nasal Mucosa; Nasopharyngeal  Result Value Ref Range Status   MRSA by PCR POSITIVE (A) NEGATIVE Final    Comment:        The GeneXpert MRSA Assay (FDA approved for NASAL specimens only), is one component of a comprehensive MRSA colonization surveillance program. It is not intended to diagnose MRSA infection nor to guide or monitor treatment for MRSA infections. CRITICAL RESULT CALLED TO, READ BACK BY AND VERIFIED WITH:  Lillie Fragmin, RN '@[email or web address]  08/17/2019 KAY Performed at [facility], [address] [place], [place] [address]     Time coordinating discharge: 36 minutes   SIGNED:  Irwin Brakeman, MD  [facility] 08/19/2019, 11:34 AM How to contact the [facility] Attending or Consulting provider [place] or covering provider  during after hours [place], for this patient?  1. Check the care team in [facility] and look for a) attending/consulting [facility] provider listed and b) the [facility] team listed 2. Log into [email or web address] and use [facility]'s universal password to access. If you do not have the password, please contact the hospital operator. 3. Locate the [facility] provider you are looking for under [facility] and page to a number that you can be directly reached. 4. If you still have difficulty reaching the provider, please page the [facility] (Director on Call) for the Hospitalists listed on amion for assistance.

## 2019-08-21 LAB — CULTURE, BLOOD (ROUTINE X 2)
Culture: NO GROWTH
Culture: NO GROWTH

## 2019-09-19 ENCOUNTER — Ambulatory Visit: Payer: BC Managed Care – PPO | Admitting: Cardiology

## 2019-09-19 ENCOUNTER — Other Ambulatory Visit: Payer: Self-pay

## 2019-09-19 ENCOUNTER — Encounter: Payer: Self-pay | Admitting: Cardiology

## 2019-09-19 VITALS — BP 140/72 | HR 68 | Ht 69.0 in | Wt 324.2 lb

## 2019-09-19 DIAGNOSIS — I4819 Other persistent atrial fibrillation: Secondary | ICD-10-CM | POA: Diagnosis not present

## 2019-09-19 DIAGNOSIS — I429 Cardiomyopathy, unspecified: Secondary | ICD-10-CM

## 2019-09-19 NOTE — Patient Instructions (Signed)
Medication Instructions:  Your physician recommends that you continue on your current medications as directed. Please refer to the Current Medication list given to you today.  *If you need a refill on your cardiac medications before your next appointment, please call your pharmacy*   Lab Work: NONE   If you have labs (blood work) drawn today and your tests are completely normal, you will receive your results only by: Marland Kitchen MyChart Message (if you have MyChart) OR . A paper copy in the mail If you have any lab test that is abnormal or we need to change your treatment, we will call you to review the results.   Testing/Procedures: Your physician has requested that you have an echocardiogram. Echocardiography is a painless test that uses sound waves to create images of your heart. It provides your doctor with information about the size and shape of your heart and how well your heart's chambers and valves are working. This procedure takes approximately one hour. There are no restrictions for this procedure.     Follow-Up: At [facility], you and your health needs are our priority.  As part of our continuing mission to provide you with exceptional heart care, we have created designated Provider Care Teams.  These Care Teams include your primary Cardiologist (physician) and Advanced Practice Providers (APPs -  Physician Assistants and Nurse Practitioners) who all work together to provide you with the care you need, when you need it.  We recommend signing up for the patient portal called "MyChart".  Sign up information is provided on this After Visit Summary.  MyChart is used to connect with patients for Virtual Visits (Telemedicine).  Patients are able to view lab/test results, encounter notes, upcoming appointments, etc.  Non-urgent messages can be sent to your provider as well.   To learn more about what you can do with MyChart, go to [email or web address].    Your next appointment:   2  month(s)  The format for your next appointment:   In Person  Provider:   Nona Dell, MD   Other Instructions Thank you for choosing [facility]!

## 2019-09-19 NOTE — Progress Notes (Signed)
Cardiology Office Note  Date: 09/19/2019   ID: [name] [name], DOB [birth year], MRN [id number]  PCP:  Andres Shad, MD  Cardiologist:  Rozann Lesches, MD Electrophysiologist:  None   Chief Complaint  Patient presents with  . Atrial Fibrillation    History of Present Illness: [name] [name] is a 59 y.o. male referred for cardiology consultation by Dr. Wynetta Emery after recent hospitalization in March with COVID-19 and newly documented atrial fibrillation.  He presents today for evaluation.  He does not describe any chest pain or palpitations, states that his heart rate has been in the 60s to 70s when he has checked it at home for the last few weeks.  He does not report any prior history of cardiac arrhythmias.  His mother does have atrial fibrillation.  Echocardiogram performed in March revealed LVEF 45 to 50% range with global hypokinesis, no significant valvular abnormalities.  CHA2DS2-VASc score is 3.  Based on current information. Hemoglobin A1c was 6.6% in early March.  No prior history of type 2 diabetes mellitus.  He states that he follows regularly with his PCP Dr. Teryl Lucy.  He works at [employer].  I personally reviewed his ECG today which shows normal sinus rhythm.  Past Medical History:  Diagnosis Date  . Asthma   . Atrial fibrillation [facility])    Documented March 2021  . Essential hypertension   . History of nephrolithiasis   . Hyperlipemia   . Obesity    BMI 42.6  . Psoriasis   . Psoriatic arthritis ([place])   . Seasonal allergies   . Sleep apnea     Past Surgical History:  Procedure Laterality Date  . NOSE SURGERY    . VIDEO ASSISTED THORACOSCOPY (VATS)/DECORTICATION Right 03/20/2014   Procedure: VIDEO ASSISTED THORACOSCOPY (VATS)/DECORTICATION;  Surgeon: Ivin Poot, MD;  Location: [facility] OR;  Service: Thoracic;  Laterality: Right;    Current Outpatient Medications  Medication Sig Dispense Refill  . apixaban (ELIQUIS) 5 MG TABS tablet Take 1 tablet (5 mg total)  by mouth 2 (two) times daily. 60 tablet 1  . atorvastatin (LIPITOR) 80 MG tablet Take 80 mg by mouth daily.    Marland Kitchen azelastine (ASTELIN) 0.1 % nasal spray Place 1 spray into both nostrils 2 (two) times daily.    . blood glucose meter kit and supplies Dispense based on patient and insurance preference. Use up to four times daily as directed. (FOR ICD-10 E10.9, E11.9). 1 each 0  . dextromethorphan-guaiFENesin (MUCINEX DM) 30-600 MG 12hr tablet Take 1 tablet by mouth 2 (two) times daily as needed for cough. 30 tablet 0  . Fluticasone-Salmeterol (ADVAIR) 250-50 MCG/DOSE AEPB Inhale 1 puff into the lungs 2 (two) times daily.    . folic acid (FOLVITE) 1 MG tablet Take 1 mg by mouth daily.    Marland Kitchen lisinopril (ZESTRIL) 5 MG tablet Take 1 tablet (5 mg total) by mouth daily. 30 tablet 1  . methotrexate (RHEUMATREX) 2.5 MG tablet Take 20 mg by mouth once a week.    . metoprolol tartrate (LOPRESSOR) 50 MG tablet Take 1 tablet (50 mg total) by mouth 2 (two) times daily. 60 tablet 1  . mometasone (NASONEX) 50 MCG/ACT nasal spray Place 2 sprays into the nose daily.    . montelukast (SINGULAIR) 10 MG tablet Take 10 mg by mouth at bedtime.    Marland Kitchen omeprazole (PRILOSEC) 40 MG capsule Take 1 capsule (40 mg total) by mouth daily. 30 capsule 1  . traMADol (ULTRAM) 50 MG tablet Take  50 mg by mouth 4 (four) times daily.    Marland Kitchen zolpidem (AMBIEN) 10 MG tablet Take 10 mg by mouth at bedtime as needed for sleep.     No current facility-administered medications for this visit.   Allergies:  Patient has no known allergies.   Social History: The patient  reports that he has never smoked. He has never used smokeless tobacco. He reports that he does not drink alcohol or use drugs.   Family History: The patient's family history includes Atrial fibrillation in his mother.   ROS:   Reports arthritic pain related to psoriatic arthritis.  Physical Exam: VS:  BP 140/72   Pulse 68   Ht '5\' 9"'$  (1.753 m)   Wt (!) 324 lb 3.2 oz (147.1 kg)    SpO2 95%   BMI 47.88 kg/m , BMI Body mass index is 47.88 kg/m.  Wt Readings from Last 3 Encounters:  09/19/19 (!) 324 lb 3.2 oz (147.1 kg)  08/16/19 300 lb (136.1 kg)  08/13/19 300 lb (136.1 kg)    General: Morbidly obese male, appears comfortable at rest. HEENT: Conjunctiva and lids normal, wearing a mask. Neck: Supple, no elevated JVP or carotid bruits, no thyromegaly. Lungs: Clear to auscultation, nonlabored breathing at rest. Cardiac: Regular rate and rhythm, no S3 or significant systolic murmur, no pericardial rub. Abdomen: Soft, bowel sounds present. Extremities: No pitting edema, distal pulses 2+. Skin: Warm and dry. Musculoskeletal: No kyphosis. Neuropsychiatric: Alert and oriented x3, affect grossly appropriate.  ECG:  An ECG dated 08/17/2019 was personally reviewed today and demonstrated:  Course atrial fibrillation with RVR, nonspecific ST-T changes.  Recent Labwork: 08/17/2019: TSH 0.772 08/19/2019: ALT 29; AST 19; BUN 30; Creatinine, Ser 1.14; Hemoglobin 15.4; Platelets 312; Potassium 4.3; Sodium 139     Component Value Date/Time   CHOL 144 08/17/2019 0606   TRIG 92 08/17/2019 0606   HDL 32 (L) 08/17/2019 0606   CHOLHDL 4.5 08/17/2019 0606   VLDL 18 08/17/2019 0606   LDLCALC 94 08/17/2019 0606    Other Studies Reviewed Today:  Echocardiogram 08/17/2019: 1. Left ventricular ejection fraction, by estimation, is 45 to 50%. The  left ventricle has mildly decreased function. The left ventricle  demonstrates global hypokinesis. There is mild concentric left ventricular  hypertrophy. Left ventricular diastolic  function could not be evaluated. Left ventricular diastolic function could  not be evaluated.  2. Right ventricular systolic function is normal. The right ventricular  size is normal.  3. The mitral valve is normal in structure and function. Trivial mitral  valve regurgitation. No evidence of mitral stenosis.  4. The aortic valve is tricuspid. Aortic valve  regurgitation is not  visualized. No aortic stenosis is present.  5. The inferior vena cava is normal in size with greater than 50%  respiratory variability, suggesting right atrial pressure of 3 mmHg.   Assessment and Plan:  1.  Newly documented atrial fibrillation in March during hospitalization with COVID-19 and hypoxic respiratory failure.  CHA2DS2-VASc score is 3 based on available information.  He was placed on Eliquis for stroke prophylaxis and also metoprolol by Dr. Wynetta Emery, tolerating both medications, and in fact is in normal sinus rhythm by ECG today. He likely converted over the last few weeks based on reported heart rates at home.  He does not recall experiencing any palpitations when he was in atrial fibrillation, so it is not entirely clear whether he has had paroxysmal atrial fibrillation that was just documented, or whether the rhythm was related  to COVID-19.  At this point would continue with current medical therapy and we will reevaluate over the next few months.  This may be a situation where we stop anticoagulation and observe if he reports no further symptoms and LVEF normalizes.  2.  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  High-sensitivity troponin I levels were not clearly suggestive of myocarditis, although this is a possibility in association with COVID-19.  We will obtain a follow-up echocardiogram for reassessment prior to next office visit.  Medication Adjustments/Labs and Tests Ordered: Current medicines are reviewed at length with the patient today.  Concerns regarding medicines are outlined above.   Tests Ordered: Orders Placed This Encounter  Procedures  . EKG 12-Lead  . ECHOCARDIOGRAM COMPLETE    Medication Changes: No orders of the defined types were placed in this encounter.   Disposition:  Follow up 2 months in the [place] office.  Signed, Satira Sark, MD, [facility] 09/19/2019  9:36 AM    [place] [facility] [place]. [address], [place], [place] [address] Phone: [phone]; Fax: [phone]

## 2019-09-27 ENCOUNTER — Other Ambulatory Visit: Payer: Self-pay

## 2019-09-27 ENCOUNTER — Ambulatory Visit (HOSPITAL_COMMUNITY)
Admission: RE | Admit: 2019-09-27 | Discharge: 2019-09-27 | Disposition: A | Discharge status: Home / Self Care | Payer: BC Managed Care – PPO | Source: Ambulatory Visit | Attending: Cardiology | Admitting: Cardiology

## 2019-09-27 DIAGNOSIS — I429 Cardiomyopathy, unspecified: Secondary | ICD-10-CM | POA: Diagnosis not present

## 2019-09-27 NOTE — Progress Notes (Signed)
*  PRELIMINARY RESULTS* Echocardiogram 2D Echocardiogram has been performed.  [name] [name] 09/27/2019, 12:13 PM

## 2019-12-17 ENCOUNTER — Ambulatory Visit: Payer: BC Managed Care – PPO | Admitting: Cardiology

## 2020-12-11 ENCOUNTER — Emergency Department (HOSPITAL_COMMUNITY): Payer: BC Managed Care – PPO

## 2020-12-11 ENCOUNTER — Other Ambulatory Visit: Payer: Self-pay

## 2020-12-11 ENCOUNTER — Emergency Department (HOSPITAL_COMMUNITY)
Admission: EM | Admit: 2020-12-11 | Discharge: 2020-12-11 | Disposition: A | Discharge status: Home / Self Care | Payer: BC Managed Care – PPO | Attending: Emergency Medicine | Admitting: Emergency Medicine

## 2020-12-11 DIAGNOSIS — R1084 Generalized abdominal pain: Secondary | ICD-10-CM | POA: Insufficient documentation

## 2020-12-11 DIAGNOSIS — E119 Type 2 diabetes mellitus without complications: Secondary | ICD-10-CM | POA: Insufficient documentation

## 2020-12-11 DIAGNOSIS — D72829 Elevated white blood cell count, unspecified: Secondary | ICD-10-CM | POA: Insufficient documentation

## 2020-12-11 DIAGNOSIS — Z7901 Long term (current) use of anticoagulants: Secondary | ICD-10-CM | POA: Insufficient documentation

## 2020-12-11 DIAGNOSIS — R110 Nausea: Secondary | ICD-10-CM | POA: Diagnosis not present

## 2020-12-11 DIAGNOSIS — J45909 Unspecified asthma, uncomplicated: Secondary | ICD-10-CM | POA: Insufficient documentation

## 2020-12-11 DIAGNOSIS — Z28310 Unvaccinated for covid-19: Secondary | ICD-10-CM | POA: Diagnosis not present

## 2020-12-11 DIAGNOSIS — Z8616 Personal history of COVID-19: Secondary | ICD-10-CM | POA: Insufficient documentation

## 2020-12-11 DIAGNOSIS — I4891 Unspecified atrial fibrillation: Secondary | ICD-10-CM | POA: Insufficient documentation

## 2020-12-11 DIAGNOSIS — I10 Essential (primary) hypertension: Secondary | ICD-10-CM | POA: Diagnosis not present

## 2020-12-11 DIAGNOSIS — R1033 Periumbilical pain: Secondary | ICD-10-CM | POA: Diagnosis present

## 2020-12-11 DIAGNOSIS — Z7951 Long term (current) use of inhaled steroids: Secondary | ICD-10-CM | POA: Diagnosis not present

## 2020-12-11 LAB — COMPREHENSIVE METABOLIC PANEL
ALT: 38 U/L (ref 0–44)
AST: 22 U/L (ref 15–41)
Albumin: 3.8 g/dL (ref 3.5–5.0)
Alkaline Phosphatase: 64 U/L (ref 38–126)
Anion gap: 10 (ref 5–15)
BUN: 31 mg/dL — ABNORMAL HIGH (ref 6–20)
CO2: 23 mmol/L (ref 22–32)
Calcium: 9.2 mg/dL (ref 8.9–10.3)
Chloride: 103 mmol/L (ref 98–111)
Creatinine, Ser: 1.7 mg/dL — ABNORMAL HIGH (ref 0.61–1.24)
GFR, Estimated: 46 mL/min — ABNORMAL LOW (ref >60)
Glucose, Bld: 92 mg/dL (ref 70–99)
Potassium: 4 mmol/L (ref 3.5–5.1)
Sodium: 136 mmol/L (ref 135–145)
Total Bilirubin: 1.2 mg/dL (ref 0.3–1.2)
Total Protein: 6.8 g/dL (ref 6.5–8.1)

## 2020-12-11 LAB — CBC
HCT: 47.8 % (ref 39.0–52.0)
Hemoglobin: 15.3 g/dL (ref 13.0–17.0)
MCH: 29.5 pg (ref 26.0–34.0)
MCHC: 32 g/dL (ref 30.0–36.0)
MCV: 92.3 fL (ref 80.0–100.0)
Platelets: 327 10*3/uL (ref 150–400)
RBC: 5.18 MIL/uL (ref 4.22–5.81)
RDW: 18.1 % — ABNORMAL HIGH (ref 11.5–15.5)
WBC: 17.1 10*3/uL — ABNORMAL HIGH (ref 4.0–10.5)
nRBC: 0 % (ref 0.0–0.2)

## 2020-12-11 LAB — URINALYSIS, ROUTINE W REFLEX MICROSCOPIC
Bacteria, UA: NONE SEEN
Bilirubin Urine: NEGATIVE
Glucose, UA: NEGATIVE mg/dL
Hgb urine dipstick: NEGATIVE
Ketones, ur: 20 mg/dL — AB
Leukocytes,Ua: NEGATIVE
Nitrite: NEGATIVE
Protein, ur: 30 mg/dL — AB
Specific Gravity, Urine: 1.017 (ref 1.005–1.030)
pH: 5 (ref 5.0–8.0)

## 2020-12-11 LAB — LIPASE, BLOOD: Lipase: 43 U/L (ref 11–51)

## 2020-12-11 MED ORDER — CIPROFLOXACIN HCL 500 MG PO TABS: 500.0000 mg | ORAL_TABLET | Freq: Two times a day (BID) | ORAL | 0 refills | Status: AC

## 2020-12-11 MED ORDER — LACTATED RINGERS IV BOLUS
1000.0000 mL | Freq: Once | INTRAVENOUS | Status: AC
  Administered 2020-12-11: 1000 mL via INTRAVENOUS

## 2020-12-11 MED ORDER — CIPROFLOXACIN HCL 250 MG PO TABS
500.0000 mg | ORAL_TABLET | Freq: Once | ORAL | Status: AC
  Administered 2020-12-11: 500 mg via ORAL
  Filled 2020-12-11: qty 2

## 2020-12-11 MED ORDER — IOHEXOL 300 MG/ML  SOLN
75.0000 mL | Freq: Once | INTRAMUSCULAR | Status: AC | PRN
  Administered 2020-12-11: 75 mL via INTRAVENOUS

## 2020-12-11 NOTE — Discharge Instructions (Addendum)
You were seen in the ER today for your abdominal pain and abnormal blood work.  Your blood work confirms signs of infection detected on the blood work performed by your PCP.  Your blood work was otherwise reassuring as were your vital signs and physical exam.  Your CT scan revealed some changes suggestive of possible urinary tract infection.  Your urine test was equivocal and has been sent off for further testing.  You have been prescribed antibiotic to cover both for UTI and possible infection in your bowels.  Please follow-up in 1 week for reevaluation by your primary care doctor.  Below is also the contact information for the gastroenterologist who may follow-up as needed should your belly pain worsen.  Return to the ER if develop any worsening abdominal pain, nausea, vomiting that does not stop, blood in your stool or in your urine, or any other new severe symptoms.

## 2020-12-11 NOTE — ED Provider Notes (Signed)
[facility] EMERGENCY DEPARTMENT Provider Note   CSN: [id number] Arrival date & time: 12/11/20  1358     History Chief Complaint  Patient presents with   Abdominal Pain    [name] [name] is a 60 y.o. male who presents with concern for generalized abdominal pain, worse in the left side x4 days.  Was seen by PCP and had elevated white blood cell count in the office and was directed to emergency department for further evaluation.  Denies any vomiting but does endorse some nausea.  Denies any diarrhea.  She has decreased bowel movement quantity for the last week.  Denies any melena, hematochezia, hematuria, urinary symptoms.  Denies fevers or chills.  He has not been vaccinated for COVID-19.  I personally reviewed this patient's medical records.  He has history of type 2 diabetes, hypertension, A. fib anticoagulated on Eliquis, and GERD, psoriasis on methotrexat.   HPI     Past Medical History:  Diagnosis Date   Asthma    Atrial fibrillation [facility])    Documented March 2021   Essential hypertension    History of nephrolithiasis    Hyperlipemia    Obesity    BMI 42.6   Psoriasis    Psoriatic arthritis ([place])    Seasonal allergies    Sleep apnea     Patient Active Problem List   Diagnosis Date Noted   Type 2 diabetes mellitus ([place]) 08/18/2019   Coagulopathy ([place]) 08/18/2019   Long term current use of anticoagulant therapy 08/18/2019   Atrial fibrillation with RVR ([place]) 08/17/2019   COVID-19    Hypoxia    Immune deficiency disorder ([place])    Empyema ([place]) 03/20/2014   Empyema of right pleural space ([place]) 03/18/2014   Obesity 03/17/2014   CAP (community acquired pneumonia) 03/14/2014   Hydronephrosis, left 03/14/2014   Back pain 03/14/2014   Hypertension    Sleep apnea    Hyperlipemia     Past Surgical History:  Procedure Laterality Date   NOSE SURGERY     VIDEO ASSISTED THORACOSCOPY (VATS)/DECORTICATION Right 03/20/2014   Procedure: VIDEO ASSISTED THORACOSCOPY  (VATS)/DECORTICATION;  Surgeon: Ivin Poot, MD;  Location: [facility] OR;  Service: Thoracic;  Laterality: Right;       Family History  Problem Relation Age of Onset   Atrial fibrillation Mother     Social History   Tobacco Use   Smoking status: Never   Smokeless tobacco: Never  Substance Use Topics   Alcohol use: No   Drug use: No    Home Medications Prior to Admission medications   Medication Sig Start Date End Date Taking? Authorizing Provider  ciprofloxacin (CIPRO) 500 MG tablet Take 1 tablet (500 mg total) by mouth every 12 (twelve) hours for 10 days. 12/11/20 12/21/20 Yes , Gypsy Balsam, PA-C  apixaban (ELIQUIS) 5 MG TABS tablet Take 1 tablet (5 mg total) by mouth 2 (two) times daily. 08/19/19   Johnson, Clanford L, MD  atorvastatin (LIPITOR) 80 MG tablet Take 80 mg by mouth daily. 04/18/19   [provider]  azelastine (ASTELIN) 0.1 % nasal spray Place 1 spray into both nostrils 2 (two) times daily. 07/07/19   [provider]  blood glucose meter kit and supplies Dispense based on patient and insurance preference. Use up to four times daily as directed. (FOR ICD-10 E10.9, E11.9). 08/18/19   Johnson, Clanford L, MD  dextromethorphan-guaiFENesin (MUCINEX DM) 30-600 MG 12hr tablet Take 1 tablet by mouth 2 (two) times daily as needed for cough.  08/13/19   Mesner, Corene Cornea, MD  Fluticasone-Salmeterol (ADVAIR) 250-50 MCG/DOSE AEPB Inhale 1 puff into the lungs 2 (two) times daily.    [provider]  folic acid (FOLVITE) 1 MG tablet Take 1 mg by mouth daily. 07/25/19   [provider]  lisinopril (ZESTRIL) 5 MG tablet Take 1 tablet (5 mg total) by mouth daily. 08/20/19   Johnson, Clanford L, MD  methotrexate (RHEUMATREX) 2.5 MG tablet Take 20 mg by mouth once a week. 07/25/19   [provider]  metoprolol tartrate (LOPRESSOR) 50 MG tablet Take 1 tablet (50 mg total) by mouth 2 (two) times daily. 08/19/19 10/18/19  Johnson, Clanford L, MD  mometasone  (NASONEX) 50 MCG/ACT nasal spray Place 2 sprays into the nose daily.    [provider]  montelukast (SINGULAIR) 10 MG tablet Take 10 mg by mouth at bedtime.    [provider]  omeprazole (PRILOSEC) 40 MG capsule Take 1 capsule (40 mg total) by mouth daily. 08/19/19   Johnson, Clanford L, MD  traMADol (ULTRAM) 50 MG tablet Take 50 mg by mouth 4 (four) times daily. 07/25/19   [provider]  zolpidem (AMBIEN) 10 MG tablet Take 10 mg by mouth at bedtime as needed for sleep.    [provider]    Allergies    Patient has no known allergies.  Review of Systems   Review of Systems  Constitutional:  Positive for appetite change. Negative for activity change, chills, diaphoresis, fatigue and fever.  HENT: Negative.    Respiratory: Negative.    Cardiovascular: Negative.   Gastrointestinal:  Positive for abdominal distention, abdominal pain and nausea. Negative for constipation, diarrhea and vomiting.  Genitourinary:  Positive for decreased urine volume. Negative for difficulty urinating, dysuria, frequency, hematuria, testicular pain and urgency.  Musculoskeletal: Negative.   Skin: Negative.   Neurological: Negative.    Physical Exam Updated Vital Signs BP (!) 143/79 (BP Location: Left Arm)   Pulse (!) 58   Temp 98.1 F (36.7 C) (Oral)   Resp 14   Ht 5' 9" (1.753 m)   Wt (!) 138.3 kg   SpO2 99%   BMI 45.04 kg/m   Physical Exam Vitals and nursing note reviewed.  Constitutional:      Appearance: He is morbidly obese. He is not toxic-appearing.  HENT:     Head: Normocephalic and atraumatic.     Nose: Nose normal.     Mouth/Throat:     Mouth: Mucous membranes are moist.     Pharynx: Oropharynx is clear. Uvula midline. No oropharyngeal exudate, posterior oropharyngeal erythema or uvula swelling.     Tonsils: No tonsillar exudate.  Eyes:     General: Lids are normal. Vision grossly intact.        Right eye: No discharge.        Left eye: No  discharge.     Extraocular Movements: Extraocular movements intact.     Conjunctiva/sclera: Conjunctivae normal.     Pupils: Pupils are equal, round, and reactive to light.  Neck:     Trachea: Trachea and phonation normal.  Cardiovascular:     Rate and Rhythm: Normal rate and regular rhythm.     Pulses: Normal pulses.     Heart sounds: Normal heart sounds. No murmur heard. Pulmonary:     Effort: Pulmonary effort is normal. No tachypnea, bradypnea, accessory muscle usage or respiratory distress.     Breath sounds: Normal breath sounds. No wheezing or rales.  Chest:  Chest wall: No mass, lacerations, deformity, swelling, tenderness, crepitus or edema.  Abdominal:     General: Bowel sounds are normal. There is no distension.     Palpations: Abdomen is soft.     Tenderness: There is abdominal tenderness in the periumbilical area, left upper quadrant and left lower quadrant. There is no right CVA tenderness, left CVA tenderness or rebound.  Musculoskeletal:        General: No deformity.     Cervical back: Normal range of motion and neck supple. No edema, rigidity or crepitus. No pain with movement, spinous process tenderness or muscular tenderness.     Right lower leg: No edema.     Left lower leg: No edema.  Lymphadenopathy:     Cervical: No cervical adenopathy.  Skin:    General: Skin is warm and dry.  Neurological:     Mental Status: He is alert. Mental status is at baseline.     Gait: Gait is intact.  Psychiatric:        Mood and Affect: Mood normal.    ED Results / Procedures / Treatments   Labs (all labs ordered are listed, but only abnormal results are displayed) Labs Reviewed  COMPREHENSIVE METABOLIC PANEL - Abnormal; Notable for the following components:      Result Value   BUN 31 (*)    Creatinine, Ser 1.70 (*)    GFR, Estimated 46 (*)    All other components within normal limits  CBC - Abnormal; Notable for the following components:   WBC 17.1 (*)    RDW 18.1  (*)    All other components within normal limits  URINALYSIS, ROUTINE W REFLEX MICROSCOPIC - Abnormal; Notable for the following components:   APPearance HAZY (*)    Ketones, ur 20 (*)    Protein, ur 30 (*)    All other components within normal limits  URINE CULTURE  LIPASE, BLOOD    EKG None  Radiology CT Abdomen Pelvis W Contrast  Result Date: 12/11/2020 CLINICAL DATA:  Left lower quadrant abdominal pain. Nausea. Dizziness. EXAM: CT ABDOMEN AND PELVIS WITH CONTRAST TECHNIQUE: Multidetector CT imaging of the abdomen and pelvis was performed using the standard protocol following bolus administration of intravenous contrast. CONTRAST:  59m OMNIPAQUE IOHEXOL 300 MG/ML  SOLN COMPARISON:  03/13/2014 FINDINGS: Lower chest: Mild scarring at the right lung base. Right coronary artery atherosclerotic calcification. Hepatobiliary: Unremarkable Pancreas: Unremarkable Spleen: Unremarkable Adrenals/Urinary Tract: Adrenal glands appear normal. Left peripelvic cysts noted mildly effacing the left collecting system. There is a suggestion of subtle hypoenhancement the left kidney compared to the right. Small hypodense lesions in the right kidney upper pole on image 12 of series 7 are probably small cysts but technically too small to characterize. Two tiny nonobstructive right kidney lower pole calculi are present. 0.6 cm exophytic lesion of the right kidney lower pole on image 62 series 5 technically too small to characterize. Stomach/Bowel: Unremarkable Vascular/Lymphatic: Mild left common iliac artery atherosclerotic calcification. Reproductive: Unremarkable Other: No supplemental non-categorized findings. Musculoskeletal: Bone island in the right proximal femur. Lower lumbar spondylosis and degenerative disc disease resulting in foraminal impingement bilaterally at L4-5 and on the right at L3-4. Suspected facet joint effusions at the L4-5 level bilaterally. Fatty left spermatic cord. IMPRESSION: 1. Mild  hypoenhancement of the left kidney compared to the right. A specific cause is not identified, left renal artery appears widely patent. Correlate with urine analysis to assess for urinary tract infection or pyelonephritis. There are notable  left peripelvic cysts effacing the left collecting system. 2. Other imaging findings of potential clinical significance: Right coronary artery atherosclerotic calcification. Lumbar impingement at L4-5 and L3-4. Nonobstructive right nephrolithiasis. Electronically Signed   By: Van Clines M.D.   On: 12/11/2020 20:01    Procedures Procedures   Medications Ordered in ED Medications  lactated ringers bolus 1,000 mL (0 mLs Intravenous Stopped 12/11/20 1818)  iohexol (OMNIPAQUE) 300 MG/ML solution 75 mL (75 mLs Intravenous Contrast Given 12/11/20 1920)  ciprofloxacin (CIPRO) tablet 500 mg (500 mg Oral Given 12/11/20 2058)    ED Course  I have reviewed the triage vital signs and the nursing notes.  Pertinent labs & imaging results that were available during my care of the patient were reviewed by me and considered in my medical decision making (see chart for details).    MDM Rules/Calculators/A&P                         60 year old male presents with concern for 3 days of left-sided belly pain.  Differential diagnose includes but is not limited to diverticulitis, nephrolithiasis/ureterolithiasis, pyelonephritis, factious colitis, IBD, inguinal hernia, mesenteric ischemia, less abscess bowel obstruction.  Vital signs are normal intake.  Cardiopulmonary symptoms, abdominal protuberant abdomen, generally tender but worse in the periumbilical and left side of the abdomen without rebound or guarding.  He is neurovascularly intact in all 4 extremities.  CBC with leukocytosis of 17,000, CMP with AKI with creatinine of 1.7, baseline creatinine is 1.1.  We will proceed with IV fluid and contrasted CT of the abdomen pelvis.  Analgesia and antiemetic offered, patient  declined.  UA with ketonuria and proteinuria, otherwise unremarkable. Suggestive of possible pyelonephritis  CT of the abdomen pelvis with some hypoenhancement of the left kidney as well as left perinephric cysts.  Case discussed with attending physician who recommends placing patient on ciprofloxacin for possible complicated urinary tract infection; this will also cover for some enteric microbes.  Recommend close outpatient follow-up with his PCP.  No further work-up is warranted in ED as time given work-up is reassuring.  Resource understanding of medical evaluation and treatment plan.  Each of his questions was answered to his expressed satisfaction.  Strict return precautions given.  Patient is well-appearing, stable, and appropriate for discharge at this time.  This chart was dictated using voice recognition software, Dragon. Despite the best efforts of this provider to proofread and correct errors, errors may still occur which can change documentation meaning.  Final Clinical Impression(s) / ED Diagnoses Final diagnoses:  Periumbilical abdominal pain    Rx / DC Orders ED Discharge Orders          Ordered    ciprofloxacin (CIPRO) 500 MG tablet  Every 12 hours        12/11/20 2048             Courtni Balash, Gypsy Balsam, PA-C 12/11/20 2309    Fredia Sorrow, MD 12/13/20 [phone]

## 2020-12-11 NOTE — ED Triage Notes (Signed)
Abdominal pain x 3 days, sent from PCP for elevated white count

## 2020-12-11 NOTE — ED Notes (Signed)
Pt with c/o abd pain with nausea and dry heaves.  No diarrhea per pt.  Pt stood up earlier today at work with mild dizziness. Work sent pt here.

## 2020-12-13 LAB — URINE CULTURE: Culture: NO GROWTH

## 2021-02-01 ENCOUNTER — Telehealth: Payer: Self-pay

## 2021-02-01 NOTE — Telephone Encounter (Signed)
EKG was faxed to [facility]

## 2021-02-01 NOTE — Telephone Encounter (Signed)
Dr. Merleen Milliner called and was concerned about a pt he had seen in clinic today. PT had surgery for  ruptured triceps. EKG completed showed pt in a. Flutter with hr at 109. Dr. Merleen Milliner stated that pt was asymptomatic, but wanted to know how concerned he should be.

## 2021-02-02 NOTE — Telephone Encounter (Signed)
Noted.  I reviewed the ECG sent over from [place] with Dr. Teryl Lucy.  Patient is in atrial fibrillation with mildly increased heart rate.  I met him in consultation in April of last year, he has not followed up as recommended at that visit.  He has a known history of atrial fibrillation following hospitalization with COVID-19.  At the time that I saw him he was on both beta-blocker and Eliquis and was to follow-up to see if longer-term anticoagulation and reassessment of LVEF was necessary as he also had evidence of cardiomyopathy with LVEF 45 to 50%.  Would clarify that he is continuing on Eliquis and beta-blocker, please schedule an office follow-up for further discussion.  How long he has been back in atrial fibrillation is uncertain unless there are any ECGs done around the time of his surgery.   Myles Gip, MD   Spoke to Dr. Teryl Lucy who stated that the pt had ECG prior to surgery and was in SR and that the patient was no longer taking Eliquis or beta-blocker. Will schedule office follow up visit with pt.

## 2021-03-23 ENCOUNTER — Ambulatory Visit: Payer: BC Managed Care – PPO | Admitting: Family Medicine

## 2021-03-25 NOTE — Progress Notes (Signed)
Cardiology Office Note  Date: 03/28/2021   ID: Everard Interrante, DOB [birth year], MRN [id number]  PCP:  Arlina Robes, MD  Cardiologist:  Nona Dell, MD Electrophysiologist:  None   Chief Complaint: Atrial fibrillation/anticoagulation  History of Present Illness: [name] [name] is a 60 y.o. male with a history of atrial fibrillation, HTN, sleep apnea, DM2, anticoagulopathy, obesity, HLD.  He was last seen by Dr. Diona Browner on 09/19/2019 for consultation by Dr. Laural Benes after  hospitalization last year for COVID-19 and newly documented atrial fibrillation.  He did not describe any chest pain or palpitations.  Heart rate in the 60s to 70s at home.  Echocardiogram in March 2021 EF of 45 to 50% with global hypokinesis, no significant valvular abnormalities.  TMJ score was 3 hemoglobin A1c was 7.6 in March no prior history of type 2 diabetes.  He was following regularly with his PCP Dr. Merleen Milliner.  His CHA2DS2-VASc score was 3.  He was placed on Eliquis for stroke prophylaxis and metoprolol by Dr. Laural Benes.  He was in normal sinus rhythm by EKG during that visit.  Dr. Diona Browner mentioned he likely converted over the last few weeks based on reported heart rates at home.  He did not recall any palpitations when he was in atrial fibrillation.  At that point plan was to continue with current medical therapy and reevaluate over the next few months.  Dr. Diona Browner mentioned this may be a situation where we stop anticoagulation and reports no further symptoms LVEF normalized.  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  Follow-up echo on 09/27/2019 showed return LVEF back to normal at 55 to 60%.  He had moderate concentric LVH, normal diastolic parameters, normal RV function, trivial MR  He is here today for follow-up.  He denies any further issues with atrial fibrillation or  sensation of palpitations/racing heart or skipping sensations in his chest.  EKG today shows normal sinus rhythm rate of 75.  He states he has been off of Eliquis for quite some time without any issues.  He states "the hospital took me off of Eliquis".   He states " I was having terrible nosebleeds on it". Currently not on beta-blockers.  He states he was briefly in atrial fibrillation during the time he had  COVID infection but since that time he has had no further episodes.  He denies any orthostatic symptoms, CVA or TIA-like symptoms, DOE/SOB, PND, orthopnea.  He states he knows he is significantly overweight and needs to lose a fair amount of weight.  Blood pressure is elevated today at 140/80.  He states he has whitecoat syndrome and blood pressure is usually in the high 100s to low 120's at home.  Systolic.  He denies any claudication-like symptoms, DVT or PE-like symptoms.  No lower extremity edema.  He recently injured his right arm with triceps repair and has a brace on his right arm.  There is a note from Dr Diona Browner on 02/02/2021 from Dr Merleen Milliner, his PCP, regarding an EKG demonstrating atrial fibrillation which occurred apparently during his recent triceps repair surgery in August. Per Dr Diona Browner he was seen in April of prior year and had not followed up as recommended at that visit. Dr McDowell's note recommended clarification to check if patient was currently taking Eliquis and BB. Patient states he was not notified for clarification.   Past Medical  History:  Diagnosis Date   Asthma    Atrial fibrillation [facility])    Documented March 2021   Essential hypertension    History of nephrolithiasis    Hyperlipemia    Obesity    BMI 42.6   Psoriasis    Psoriatic arthritis (HCC)    Seasonal allergies    Sleep apnea     Past Surgical History:  Procedure Laterality Date   NOSE SURGERY     VIDEO ASSISTED THORACOSCOPY (VATS)/DECORTICATION Right 03/20/2014   Procedure: VIDEO ASSISTED  THORACOSCOPY (VATS)/DECORTICATION;  Surgeon: Kerin Perna, MD;  Location: [facility] OR;  Service: Thoracic;  Laterality: Right;    Current Outpatient Medications  Medication Sig Dispense Refill   atorvastatin (LIPITOR) 80 MG tablet Take 80 mg by mouth daily.     Fluticasone-Salmeterol (ADVAIR) 250-50 MCG/DOSE AEPB Inhale 1 puff into the lungs 2 (two) times daily.     folic acid (FOLVITE) 1 MG tablet Take 1 mg by mouth daily.     lisinopril (ZESTRIL) 20 MG tablet Take 20 mg by mouth daily.     methotrexate (RHEUMATREX) 2.5 MG tablet Take 20 mg by mouth once a week.     montelukast (SINGULAIR) 10 MG tablet Take 10 mg by mouth at bedtime.     omeprazole (PRILOSEC) 40 MG capsule Take 1 capsule (40 mg total) by mouth daily. 30 capsule 1   traMADol (ULTRAM) 50 MG tablet Take 50 mg by mouth every 6 (six) hours as needed.     zolpidem (AMBIEN) 10 MG tablet Take 10 mg by mouth at bedtime as needed for sleep.     No current facility-administered medications for this visit.   Allergies:  Patient has no known allergies.   Social History: The patient  reports that he has never smoked. He has never used smokeless tobacco. He reports that he does not drink alcohol and does not use drugs.   Family History: The patient's family history includes Atrial fibrillation in his mother.   ROS:  Please see the history of present illness. Otherwise, complete review of systems is positive for none.  All other systems are reviewed and negative.   Physical Exam: VS:  BP 140/80   Pulse 78   Ht 5\' 9"  (1.753 m)   Wt (!) 323 lb 3.2 oz (146.6 kg)   SpO2 99%   BMI 47.73 kg/m , BMI Body mass index is 47.73 kg/m.  Wt Readings from Last 3 Encounters:  03/26/21 (!) 323 lb 3.2 oz (146.6 kg)  12/11/20 (!) 305 lb (138.3 kg)  09/19/19 (!) 324 lb 3.2 oz (147.1 kg)    General: Patient appears comfortable at rest. Neck: Supple, no elevated JVP or carotid bruits, no thyromegaly. Lungs: Clear to auscultation, nonlabored  breathing at rest. Cardiac: Regular rate and rhythm, no S3 or significant systolic murmur, no pericardial rub. Extremities: No pitting edema, distal pulses 2+. Skin: Warm and dry. Musculoskeletal: No kyphosis. Neuropsychiatric: Alert and oriented x3, affect grossly appropriate.  ECG: 03/26/2021 EKG normal sinus rhythm rate of 75.  Recent Labwork: 12/11/2020: ALT 38; AST 22; BUN 31; Creatinine, Ser 1.70; Hemoglobin 15.3; Platelets 327; Potassium 4.0; Sodium 136     Component Value Date/Time   CHOL 144 08/17/2019 0606   TRIG 92 08/17/2019 0606   HDL 32 (L) 08/17/2019 0606   CHOLHDL 4.5 08/17/2019 0606   VLDL 18 08/17/2019 0606   LDLCALC 94 08/17/2019 0606    Other Studies Reviewed Today: Percent results   Echocardiogram 09/27/2019  1. Left ventricular ejection fraction, by estimation, is 55 to 60%. The  left ventricle has normal function. The left ventricle has no regional  wall motion abnormalities. There is moderate concentric left ventricular  hypertrophy. Left ventricular  diastolic parameters were normal.   2. Right ventricular systolic function is normal. The right ventricular  size is normal.   3. The mitral valve is grossly normal. Trivial mitral valve  regurgitation.   4. The aortic valve is tricuspid. Aortic valve regurgitation is not  visualized. No aortic stenosis is present.   5. The inferior vena cava is normal in size with greater than 50%  respiratory variability, suggesting right atrial pressure of 3 mmHg.    Echocardiogram 08/17/2019:  1. Left ventricular ejection fraction, by estimation, is 45 to 50%. The  left ventricle has mildly decreased function. The left ventricle  demonstrates global hypokinesis. There is mild concentric left ventricular  hypertrophy. Left ventricular diastolic  function could not be evaluated. Left ventricular diastolic function could  not be evaluated.   2. Right ventricular systolic function is normal. The right ventricular  size  is normal.   3. The mitral valve is normal in structure and function. Trivial mitral  valve regurgitation. No evidence of mitral stenosis.   4. The aortic valve is tricuspid. Aortic valve regurgitation is not  visualized. No aortic stenosis is present.   5. The inferior vena cava is normal in size with greater than 50%  respiratory variability, suggesting right atrial pressure of 3 mmHg.    Assessment and Plan:  1. Atrial fibrillation, unspecified type (HCC)   2. Secondary cardiomyopathy (HCC)   3. Essential hypertension   4. Mixed hyperlipidemia    1. Atrial fibrillation, unspecified type [facility]) Patient denies any subsequent episodes of atrial fibrillation.  There is a telephone note in the system from Dr. Merleen Milliner his primary care provider stating patient had a recent surgery on his triceps and EKG showed patient in atrial flutter with a rate of 109.  Dr. Merleen Milliner stated patient was asymptomatic but wanted to know how concerned he should be.  Dr. Diona Browner reviewed the EKG sent by Dr. Merleen Milliner.  Dr. Diona Browner noted to clarify that he was continuing Eliquis and beta-blocker and schedule follow-up for further discussion.  Dr. Diona Browner mentioned how long he has been in atrial fibrillation was uncertain unless there were EKGs done around the time of surgery.  Today EKG shows normal sinus rhythm with a rate of 75, no ectopy noted, no ST or T wave abnormalities noted.  Patient states he is not taking Eliquis due to  significant nosebleeds. We discussed risks and benefits of not taking the medication and he understands the risks. He prefers only to take an aspirin 81 mg. He will start ASA 81 mg daily. He is not on BB currently. CHADS2-Vasc = 1  2. Secondary cardiomyopathy (HCC) Previous history of secondary cardiomyopathy which resolved after follow-up echocardiogram last year .Echo April of last year showed EF of 55 to 60%.  No WMA's, moderate LVH concentric, trivial MR.  Patient denies any DOE or SOB,  or lower extremity edema.  3. Essential hypertension BP was elevated today at 140/80. He states he has white coat hypertension. His BP's are usually much better at home per his statement. Continue Lisinopril 20 mg po daily.   4.Hyperlipidemia Continue atorvastatin 80 mg po daily. Last lipid profile 08/17/2019 : TC 144, TG 92, HDL 32, LDL 94.   Medication Adjustments/Labs and Tests Ordered: Current medicines  are reviewed at length with the patient today.  Concerns regarding medicines are outlined above.   Disposition: Follow-up with Dr Diona Browner or APP 6 months  Signed, Rennis Harding, NP 03/28/2021 5:50 AM    [facility] [address] [place], [place], [place] [address] Phone: [phone]; Fax: [phone]

## 2021-03-26 ENCOUNTER — Other Ambulatory Visit: Payer: Self-pay

## 2021-03-26 ENCOUNTER — Ambulatory Visit: Payer: BC Managed Care – PPO | Admitting: Family Medicine

## 2021-03-26 ENCOUNTER — Encounter: Payer: Self-pay | Admitting: Family Medicine

## 2021-03-26 VITALS — BP 140/80 | HR 78 | Ht 69.0 in | Wt 323.2 lb

## 2021-03-26 DIAGNOSIS — I10 Essential (primary) hypertension: Secondary | ICD-10-CM

## 2021-03-26 DIAGNOSIS — I429 Cardiomyopathy, unspecified: Secondary | ICD-10-CM | POA: Diagnosis not present

## 2021-03-26 DIAGNOSIS — E782 Mixed hyperlipidemia: Secondary | ICD-10-CM

## 2021-03-26 DIAGNOSIS — I4891 Unspecified atrial fibrillation: Secondary | ICD-10-CM | POA: Diagnosis not present

## 2021-03-26 NOTE — Patient Instructions (Signed)

## 2021-05-13 HISTORY — PX: CATARACT EXTRACTION, BILATERAL: SHX1313

## 2021-08-06 ENCOUNTER — Other Ambulatory Visit: Payer: Self-pay

## 2021-08-06 ENCOUNTER — Encounter (HOSPITAL_COMMUNITY): Payer: Self-pay

## 2021-08-06 ENCOUNTER — Other Ambulatory Visit (HOSPITAL_COMMUNITY): Payer: Self-pay

## 2021-08-06 ENCOUNTER — Emergency Department (HOSPITAL_COMMUNITY): Payer: No Typology Code available for payment source

## 2021-08-06 ENCOUNTER — Inpatient Hospital Stay (HOSPITAL_COMMUNITY)
Admission: EM | Admit: 2021-08-06 | Discharge: 2021-08-10 | DRG: 603 | Disposition: A | Discharge status: Home / Self Care | Payer: No Typology Code available for payment source | Attending: Internal Medicine | Admitting: Internal Medicine

## 2021-08-06 DIAGNOSIS — Z79899 Other long term (current) drug therapy: Secondary | ICD-10-CM

## 2021-08-06 DIAGNOSIS — Z87442 Personal history of urinary calculi: Secondary | ICD-10-CM | POA: Diagnosis not present

## 2021-08-06 DIAGNOSIS — G4700 Insomnia, unspecified: Secondary | ICD-10-CM

## 2021-08-06 DIAGNOSIS — E8809 Other disorders of plasma-protein metabolism, not elsewhere classified: Secondary | ICD-10-CM | POA: Diagnosis not present

## 2021-08-06 DIAGNOSIS — J4520 Mild intermittent asthma, uncomplicated: Secondary | ICD-10-CM

## 2021-08-06 DIAGNOSIS — I482 Chronic atrial fibrillation, unspecified: Secondary | ICD-10-CM | POA: Diagnosis present

## 2021-08-06 DIAGNOSIS — J45909 Unspecified asthma, uncomplicated: Secondary | ICD-10-CM | POA: Diagnosis present

## 2021-08-06 DIAGNOSIS — E785 Hyperlipidemia, unspecified: Secondary | ICD-10-CM | POA: Diagnosis present

## 2021-08-06 DIAGNOSIS — E782 Mixed hyperlipidemia: Secondary | ICD-10-CM

## 2021-08-06 DIAGNOSIS — K219 Gastro-esophageal reflux disease without esophagitis: Secondary | ICD-10-CM | POA: Diagnosis present

## 2021-08-06 DIAGNOSIS — R7302 Impaired glucose tolerance (oral): Secondary | ICD-10-CM

## 2021-08-06 DIAGNOSIS — T8130XA Disruption of wound, unspecified, initial encounter: Secondary | ICD-10-CM | POA: Diagnosis present

## 2021-08-06 DIAGNOSIS — Z9989 Dependence on other enabling machines and devices: Secondary | ICD-10-CM | POA: Diagnosis not present

## 2021-08-06 DIAGNOSIS — M79604 Pain in right leg: Secondary | ICD-10-CM | POA: Diagnosis present

## 2021-08-06 DIAGNOSIS — E1165 Type 2 diabetes mellitus with hyperglycemia: Secondary | ICD-10-CM | POA: Diagnosis present

## 2021-08-06 DIAGNOSIS — Z6841 Body Mass Index (BMI) 40.0 and over, adult: Secondary | ICD-10-CM | POA: Diagnosis not present

## 2021-08-06 DIAGNOSIS — D72829 Elevated white blood cell count, unspecified: Secondary | ICD-10-CM

## 2021-08-06 DIAGNOSIS — I480 Paroxysmal atrial fibrillation: Secondary | ICD-10-CM | POA: Diagnosis not present

## 2021-08-06 DIAGNOSIS — S81801A Unspecified open wound, right lower leg, initial encounter: Secondary | ICD-10-CM | POA: Diagnosis not present

## 2021-08-06 DIAGNOSIS — E46 Unspecified protein-calorie malnutrition: Secondary | ICD-10-CM | POA: Diagnosis present

## 2021-08-06 DIAGNOSIS — L03115 Cellulitis of right lower limb: Principal | ICD-10-CM | POA: Diagnosis present

## 2021-08-06 DIAGNOSIS — M79606 Pain in leg, unspecified: Secondary | ICD-10-CM

## 2021-08-06 DIAGNOSIS — I10 Essential (primary) hypertension: Secondary | ICD-10-CM | POA: Diagnosis present

## 2021-08-06 DIAGNOSIS — L4050 Arthropathic psoriasis, unspecified: Secondary | ICD-10-CM

## 2021-08-06 DIAGNOSIS — Z7982 Long term (current) use of aspirin: Secondary | ICD-10-CM

## 2021-08-06 DIAGNOSIS — Z20822 Contact with and (suspected) exposure to covid-19: Secondary | ICD-10-CM | POA: Diagnosis present

## 2021-08-06 DIAGNOSIS — G4733 Obstructive sleep apnea (adult) (pediatric): Secondary | ICD-10-CM | POA: Diagnosis present

## 2021-08-06 DIAGNOSIS — E669 Obesity, unspecified: Secondary | ICD-10-CM | POA: Diagnosis present

## 2021-08-06 DIAGNOSIS — E66813 Obesity, class 3: Secondary | ICD-10-CM

## 2021-08-06 DIAGNOSIS — T148XXA Other injury of unspecified body region, initial encounter: Secondary | ICD-10-CM

## 2021-08-06 DIAGNOSIS — E876 Hypokalemia: Secondary | ICD-10-CM

## 2021-08-06 DIAGNOSIS — L089 Local infection of the skin and subcutaneous tissue, unspecified: Principal | ICD-10-CM

## 2021-08-06 LAB — CBC WITH DIFFERENTIAL/PLATELET
Abs Immature Granulocytes: 0.12 10*3/uL — ABNORMAL HIGH (ref 0.00–0.07)
Basophils Absolute: 0.1 10*3/uL (ref 0.0–0.1)
Basophils Relative: 0 %
Eosinophils Absolute: 0 10*3/uL (ref 0.0–0.5)
Eosinophils Relative: 0 %
HCT: 41.4 % (ref 39.0–52.0)
Hemoglobin: 13.7 g/dL (ref 13.0–17.0)
Immature Granulocytes: 1 %
Lymphocytes Relative: 5 %
Lymphs Abs: 1.1 10*3/uL (ref 0.7–4.0)
MCH: 30.1 pg (ref 26.0–34.0)
MCHC: 33.1 g/dL (ref 30.0–36.0)
MCV: 91 fL (ref 80.0–100.0)
Monocytes Absolute: 0.9 10*3/uL (ref 0.1–1.0)
Monocytes Relative: 4 %
Neutro Abs: 22.1 10*3/uL — ABNORMAL HIGH (ref 1.7–7.7)
Neutrophils Relative %: 90 %
Platelets: 316 10*3/uL (ref 150–400)
RBC: 4.55 MIL/uL (ref 4.22–5.81)
RDW: 19.8 % — ABNORMAL HIGH (ref 11.5–15.5)
WBC: 24.3 10*3/uL — ABNORMAL HIGH (ref 4.0–10.5)
nRBC: 0 % (ref 0.0–0.2)

## 2021-08-06 LAB — COMPREHENSIVE METABOLIC PANEL
ALT: 48 U/L — ABNORMAL HIGH (ref 0–44)
AST: 34 U/L (ref 15–41)
Albumin: 3.4 g/dL — ABNORMAL LOW (ref 3.5–5.0)
Alkaline Phosphatase: 65 U/L (ref 38–126)
Anion gap: 7 (ref 5–15)
BUN: 16 mg/dL (ref 6–20)
CO2: 25 mmol/L (ref 22–32)
Calcium: 8.5 mg/dL — ABNORMAL LOW (ref 8.9–10.3)
Chloride: 109 mmol/L (ref 98–111)
Creatinine, Ser: 1.03 mg/dL (ref 0.61–1.24)
GFR, Estimated: >60 mL/min (ref >60)
Glucose, Bld: 150 mg/dL — ABNORMAL HIGH (ref 70–99)
Potassium: 3 mmol/L — ABNORMAL LOW (ref 3.5–5.1)
Sodium: 141 mmol/L (ref 135–145)
Total Bilirubin: 1.4 mg/dL — ABNORMAL HIGH (ref 0.3–1.2)
Total Protein: 6.3 g/dL — ABNORMAL LOW (ref 6.5–8.1)

## 2021-08-06 LAB — LACTIC ACID, PLASMA
Lactic Acid, Venous: 1.8 mmol/L (ref 0.5–1.9)
Lactic Acid, Venous: 1.6 mmol/L (ref 0.5–1.9)

## 2021-08-06 MED ORDER — ACETAMINOPHEN 650 MG RE SUPP: 650.0000 mg | Freq: Four times a day (QID) | RECTAL | Status: DC | PRN

## 2021-08-06 MED ORDER — ONDANSETRON HCL 4 MG/2ML IJ SOLN: 4.0000 mg | Freq: Four times a day (QID) | INTRAMUSCULAR | Status: DC | PRN

## 2021-08-06 MED ORDER — ENOXAPARIN SODIUM 80 MG/0.8ML IJ SOSY
70.0000 mg | PREFILLED_SYRINGE | INTRAMUSCULAR | Status: DC
  Administered 2021-08-06 – 2021-08-10 (×4): 70 mg via SUBCUTANEOUS
  Filled 2021-08-06 (×4): qty 0.8

## 2021-08-06 MED ORDER — ACETAMINOPHEN 325 MG PO TABS: 650.0000 mg | ORAL_TABLET | Freq: Four times a day (QID) | ORAL | Status: DC | PRN

## 2021-08-06 MED ORDER — ONDANSETRON HCL 4 MG PO TABS: 4.0000 mg | ORAL_TABLET | Freq: Four times a day (QID) | ORAL | Status: DC | PRN

## 2021-08-06 MED ORDER — VANCOMYCIN HCL IN DEXTROSE 1-5 GM/200ML-% IV SOLN
1000.0000 mg | Freq: Once | INTRAVENOUS | Status: AC
  Administered 2021-08-06: 1000 mg via INTRAVENOUS
  Filled 2021-08-06: qty 200

## 2021-08-06 NOTE — ED Triage Notes (Addendum)
Pov from home with cc of right leg pain and swelling. Pt had a laceration around 2.5 weeks ago that required stitches. Pt also has swelling that caused it to be tight and started leaking. 3 days ago he had stitches taken out. He said the wound tried to open. He was told to wear compression socks but he thinks that might have decreased his circulation too much.  He has already completed a course of antibiotics. He has been getting his wound dressed every morning and they use silvadene ointment on it.   He said wound has started to open even more.  Says it is red and warm to the touch. And redness has been going down to his foot.

## 2021-08-06 NOTE — ED Provider Notes (Signed)
[facility]ERGENCY DEPARTMENT Provider Note   CSN: [id number] Arrival date & time: 08/06/21  1942     History  Chief Complaint  Patient presents with   Leg Pain    [name] [name] is a 61 y.o. male.  Patient is a 61 year old male with past medical history of hypertension, hyperlipidemia, atrial fibrillation.  Patient presenting today with complaints of right leg pain and swelling.  He sustained a laceration approximately 1-1/2 weeks ago while at work.  He reports being struck on the leg with a sharp object.  He had sutures placed and was initially treated with Keflex.  When the sutures were removed, the wound reopened.  Since that time, patient has had increased swelling, redness, and pain to the lower leg along with increased drainage from the wound itself.  He denies fevers or chills.  The history is provided by the patient.      Home Medications Prior to Admission medications   Medication Sig Start Date End Date Taking? Authorizing Provider  atorvastatin (LIPITOR) 80 MG tablet Take 80 mg by mouth daily. 04/18/19   [provider]  Fluticasone-Salmeterol (ADVAIR) 250-50 MCG/DOSE AEPB Inhale 1 puff into the lungs 2 (two) times daily.    [provider]  folic acid (FOLVITE) 1 MG tablet Take 1 mg by mouth daily. 07/25/19   [provider]  lisinopril (ZESTRIL) 20 MG tablet Take 20 mg by mouth daily. 10/22/20   [provider]  methotrexate (RHEUMATREX) 2.5 MG tablet Take 20 mg by mouth once a week. 07/25/19   [provider]  montelukast (SINGULAIR) 10 MG tablet Take 10 mg by mouth at bedtime.    [provider]  omeprazole (PRILOSEC) 40 MG capsule Take 1 capsule (40 mg total) by mouth daily. 08/19/19   Johnson, Clanford L, MD  traMADol (ULTRAM) 50 MG tablet Take 50 mg by mouth every 6 (six) hours as needed.    [provider]  zolpidem (AMBIEN) 10 MG tablet Take 10 mg by mouth at bedtime as needed for sleep.    [provider]      Allergies    Patient has no known allergies.    Review of Systems   Review of Systems  All other systems reviewed and are negative.  Physical Exam Updated Vital Signs BP (!) 157/79 (BP Location: Right Arm)    Pulse 92    Temp 99 F (37.2 C) (Oral)    Resp 19    Ht 5\' 9"  (1.753 m)    Wt (!) 146.6 kg    SpO2 95%    BMI 47.73 kg/m  Physical Exam Vitals and nursing note reviewed.  Constitutional:      General: He is not in acute distress.    Appearance: Normal appearance. He is not ill-appearing.  HENT:     Head: Normocephalic and atraumatic.  Pulmonary:     Effort: Pulmonary effort is normal.  Musculoskeletal:        General: Normal range of motion.     Comments: The right lower extremity has a nonhealing wound to the anterior lateral aspect of the distal shin.  There is marked erythema surrounding the wound along with significant swelling and edema.  The area is warm to the touch.  There is purulent drainage noted on the gauze overlying the wound.  DP pulses are palpable and the foot appears well-perfused.  Skin:    General: Skin is warm and dry.  Neurological:  Mental Status: He is alert.      ED Results / Procedures / Treatments   Labs (all labs ordered are listed, but only abnormal results are displayed) Labs Reviewed  COMPREHENSIVE METABOLIC PANEL - Abnormal; Notable for the following components:      Result Value   Potassium 3.0 (*)    Glucose, Bld 150 (*)    Calcium 8.5 (*)    Total Protein 6.3 (*)    Albumin 3.4 (*)    ALT 48 (*)    Total Bilirubin 1.4 (*)    All other components within normal limits  CBC WITH DIFFERENTIAL/PLATELET - Abnormal; Notable for the following components:   WBC 24.3 (*)    RDW 19.8 (*)    Neutro Abs 22.1 (*)    Abs Immature Granulocytes 0.12 (*)    All other components within normal limits  LACTIC ACID, PLASMA  LACTIC ACID, PLASMA    EKG None  Radiology DG Tibia/Fibula Right  Result Date:  08/06/2021 CLINICAL DATA:  Pain and swelling EXAM: RIGHT TIBIA AND FIBULA - 2 VIEW COMPARISON:  None. FINDINGS: Report no fracture or malalignment. Considerable soft tissue swelling without emphysema. Rectangular shaped opacity at the distal lower leg on the lateral side. Wound or ulcer anteriorly at the distal lower leg. No periostitis or osseous destructive change IMPRESSION: Considerable soft tissue swelling without acute osseous abnormality. Wound or ulcer anteriorly at the distal lower leg. Rectangular shaped opacity at the distal lower leg on the lateral side, frontal view, question external artifact, correlate with direct inspection Electronically Signed   By: Jasmine Pang M.D.   On: 08/06/2021 20:49    Procedures Procedures    Medications Ordered in ED Medications  vancomycin (VANCOCIN) IVPB 1000 mg/200 mL premix (has no administration in time range)    ED Course/ Medical Decision Making/ A&P  This patient presents to the ED for concern of right leg pain and swelling, this involves an extensive number of treatment options, and is a complaint that carries with it a high risk of complications and morbidity.  The differential diagnosis includes cellulitis/wound infection   Co morbidities that complicate the patient evaluation  Diabetes and obesity   Additional history obtained:  Additional history obtained from wife at bedside No external records necessary   Lab Tests:  I Ordered, and personally interpreted labs.  The pertinent results include: Patient has a white count of 24,000, but laboratory studies are otherwise unremarkable   Imaging Studies ordered:  I ordered imaging studies including x-ray of the lower leg I independently visualized and interpreted imaging which showed soft tissue swelling, but no other obvious abnormality I agree with the radiologist interpretation   Cardiac Monitoring:  None   Medicines ordered and prescription drug management:  I  ordered medication including vancomycin for infection Reevaluation of the patient after these medicines showed that the patient improved I have reviewed the patients home medicines and have made adjustments as needed   Test Considered:  No other test considered   Critical Interventions:  IV antibiotics   Consultations Obtained:  I requested consultation with the hospitalist, Dr. Thomes Dinning,  and discussed lab and imaging findings as well as pertinent plan - they recommend: Admission to the hospital for repeat IV antibiotics   Problem List / ED Course:  Patient presenting here with right leg pain, redness, and swelling that has worsened over the past week.  He originally sustained a laceration to the leg and had stitches placed.  The wound  has since opened up and is now draining purulent material and the leg appears infected.  I have initiated vancomycin and will have the patient admitted to the hospitalist service.  He has a white count of 24,000.   Reevaluation:  After the interventions noted above, I reevaluated the patient and found that they have : Improved   Social Determinants of Health:  None   Dispostion:  After consideration of the diagnostic results and the patients response to treatment, I feel that the patent would benefit from admission to the hospital for repeat IV antibiotics.    Final Clinical Impression(s) / ED Diagnoses Final diagnoses:  None    Rx / DC Orders ED Discharge Orders     None         Geoffery Lyons, MD 08/07/21 [phone]

## 2021-08-06 NOTE — Assessment & Plan Note (Deleted)
Wound nurse will be consulted for continuous wound care

## 2021-08-06 NOTE — H&P (Addendum)
History and Physical    Patient: [name] [name] [id number] DOB: 1960/06/17 DOA: 08/06/2021 DOS: the patient was seen and examined on 08/07/2021 PCP: Andres Shad, MD  Patient coming from: Home  Chief Complaint:  Chief Complaint  Patient presents with   Leg Pain    HPI: [name] [name] is a 61 y.o. male with medical history significant of hypertension, hyperlipidemia, GERD, A-fib, asthma who presents to the emergency department due to 2 day onset of right leg pain and swelling.  Patient states that he sustained a wound about 2 weeks ago at work after being struck with a sharp object on the leg.  States that sutures were placed and that was started on Keflex, removal of stitches about 3 days ago, he noted that the wound reopened and the leg started to swell.  He also developed increased redness and pain to the leg and increased drainage from the wound.  Patient denies fever, chills, chest pain, shortness of breath, headache, nausea, vomiting.  ED course: In the emergency department, he was hemodynamically stable, BP was 157/79, other vital signs were within normal range.  Work-up in the ED showed normal CBC except for leukocytosis, BMP showed hypokalemia and hyperglycemia.  Lactic acid was normal, ALT 48. Right tibia and fibula x-ray showed considerable soft tissue swelling without acute osseous abnormality. Wound or ulcer anteriorly at the distal lower leg. He was started on IV vancomycin.  Hospitalist was asked to admit patient for further evaluation and management.  Review of Systems: As mentioned in the history of present illness. All other systems reviewed and are negative. Past Medical History:  Diagnosis Date   Asthma    Atrial fibrillation [facility])    Documented March 2021   Essential hypertension    History of nephrolithiasis    Hyperlipemia    Obesity    BMI 42.6   Psoriasis    Psoriatic arthritis ([place])    Seasonal allergies    Sleep apnea    Past Surgical History:   Procedure Laterality Date   NOSE SURGERY     VIDEO ASSISTED THORACOSCOPY (VATS)/DECORTICATION Right 03/20/2014   Procedure: VIDEO ASSISTED THORACOSCOPY (VATS)/DECORTICATION;  Surgeon: Ivin Poot, MD;  Location: [facility];  Service: Thoracic;  Laterality: Right;   Social History:  reports that he has never smoked. He has never used smokeless tobacco. He reports that he does not drink alcohol and does not use drugs.  No Known Allergies  Family History  Problem Relation Age of Onset   Atrial fibrillation Mother     Prior to Admission medications   Medication Sig Start Date End Date Taking? Authorizing Provider  atorvastatin (LIPITOR) 80 MG tablet Take 80 mg by mouth daily. 04/18/19   [provider]  Fluticasone-Salmeterol (ADVAIR) 250-50 MCG/DOSE AEPB Inhale 1 puff into the lungs 2 (two) times daily.    [provider]  folic acid (FOLVITE) 1 MG tablet Take 1 mg by mouth daily. 07/25/19   [provider]  lisinopril (ZESTRIL) 20 MG tablet Take 20 mg by mouth daily. 10/22/20   [provider]  methotrexate (RHEUMATREX) 2.5 MG tablet Take 20 mg by mouth once a week. 07/25/19   [provider]  montelukast (SINGULAIR) 10 MG tablet Take 10 mg by mouth at bedtime.    [provider]  omeprazole (PRILOSEC) 40 MG capsule Take 1 capsule (40 mg total) by mouth daily. 08/19/19   Johnson, Clanford L, MD  traMADol (ULTRAM) 50 MG tablet Take 50 mg by  mouth every 6 (six) hours as needed.    [provider]  zolpidem (AMBIEN) 10 MG tablet Take 10 mg by mouth at bedtime as needed for sleep.    [provider]    Physical Exam: Vitals:   08/07/21 0000 08/07/21 0030 08/07/21 0044 08/07/21 0109  BP: 134/66 134/67  (!) 153/87  Pulse: 65 65  66  Resp: (!) 22 (!) 21    Temp:   98.5 F (36.9 C) 98.8 F (37.1 C)  TempSrc:   Oral Oral  SpO2: 97% 99%  100%  Weight:      Height:       General: Patient was awake and alert and oriented x3.  Not in any acute distress.  HEENT: NCAT.  PERRLA. EOMI. Sclerae anicteric.  Moist mucosal membranes. Neck: Neck supple without lymphadenopathy. No carotid bruits. No masses palpated.  Cardiovascular: Regular rate with normal S1-S2 sounds. No murmurs, rubs or gallops auscultated. No JVD.  Respiratory: Clear breath sounds.  No accessory muscle use. Abdomen: Soft, nontender, nondistended. Active bowel sounds. No masses or hepatosplenomegaly  Skin: No rashes, lesions, or ulcerations.  Dry, warm to touch. Musculoskeletal: Right leg with an open wound on the shin with surrounding erythema and swelling.  The area was warm to touch.  2+ dorsalis pedis and radial pulses. Good ROM.  No contractures  Psychiatric: Intact judgment and insight.  Mood appropriate to current condition. Neurologic: No focal neurological deficits. Strength is 5/5 x 4.  CN II - XII grossly intact.   Data Reviewed: EKG personally and reviewed showed normal sinus rhythm at a rate of 71 bpm  Assessment and Plan: * Cellulitis of right lower leg- (present on admission) Patient was started on IV vancomycin in the ED, we shall continue same at this time  Open wound of right lower extremity Wound nurse will be consulted for continuous wound care  Leukocytosis WBC 24.3, this may be related to patient's right leg wound infection/cellulitis Continue antibiotic treatment as described for cellulitis  Hypoalbuminemia due to protein-calorie malnutrition (HCC) Albumin 3.4, this is possibly secondary to mild protein calorie malnutrition Protein supplement to be provided  Hyperglycemia due to diabetes mellitus (HCC) CBG at 150, hemoglobin A1c about 1 year ago was 6.6 Continue to monitor blood glucose level, repeat hemoglobin A1c and consider starting patient on ISS and hypoglycemia protocol based on findings Patient was currently not on any antidiabetic medications  Hypokalemia K+ 3.0, this will be replenished  Insomnia Continue  Ambien per home regimen  Psoriatic arthritis ([place]) Continue home methotrexate and folic acid  GERD (gastroesophageal reflux disease) Continue Protonix  Asthma, chronic Patient was not in any acute exacerbation Continue Dulera and Singulair  Atrial fibrillation, chronic ([place]) Patient with history of chronic A-fib CHADs2Vasc score was at least 1 Last follow-up with cardiologist's office was in October 2022, where it was realized that patient was not on Eliquis due to significant nosebleeds.   Patient states that he was taking off all meds since his EKG(03/26/2021) was normal at a rate of 75. Echocardiogram done on 09/27/2019 showed LVEF of 55 to 60%. LV has no RWMA.  LV diastolic parameters were normal. EKG done in the ED today showed normal sinus rhythm at a rate of 71 bpm  Obesity- (present on admission) BMI 47.73, diet and lifestyle modification encouraged Patient will need to follow-up with outpatient PCP for weight control program  Hyperlipemia- (present on admission) Continue Lipitor  OSA on CPAP Continue CPAP  Hypertension- (present on  admission) Continue lisinopril   Insomnia  Continue Ambien per home regimen  Advance Care Planning: CODE STATUS: Full code  Consults: None  Family Communication: None at soft tissue swelling without acute osseous abnormality. Wound or ulcer anteriorly at the distal lower leg.  Severity of Illness: The appropriate patient status for this patient is INPATIENT. Inpatient status is judged to be reasonable and necessary in order to provide the required intensity of service to ensure the patient's safety. The patient's presenting symptoms, physical exam findings, and initial radiographic and laboratory data in the context of their chronic comorbidities is felt to place them at high risk for further clinical deterioration. Furthermore, it is not anticipated that the patient will be medically stable for discharge from the hospital within 2  midnights of admission.   * I certify that at the point of admission it is my clinical judgment that the patient will require inpatient hospital care spanning beyond 2 midnights from the point of admission due to high intensity of service, high risk for further deterioration and high frequency of surveillance required.*  Author: Bernadette Hoit, DO 08/07/2021 1:37 AM  For on call review [email or web address].

## 2021-08-07 DIAGNOSIS — G4733 Obstructive sleep apnea (adult) (pediatric): Secondary | ICD-10-CM

## 2021-08-07 DIAGNOSIS — L4050 Arthropathic psoriasis, unspecified: Secondary | ICD-10-CM

## 2021-08-07 DIAGNOSIS — T148XXA Other injury of unspecified body region, initial encounter: Secondary | ICD-10-CM

## 2021-08-07 DIAGNOSIS — Z9989 Dependence on other enabling machines and devices: Secondary | ICD-10-CM

## 2021-08-07 DIAGNOSIS — L089 Local infection of the skin and subcutaneous tissue, unspecified: Principal | ICD-10-CM

## 2021-08-07 DIAGNOSIS — K219 Gastro-esophageal reflux disease without esophagitis: Secondary | ICD-10-CM

## 2021-08-07 DIAGNOSIS — E876 Hypokalemia: Secondary | ICD-10-CM

## 2021-08-07 DIAGNOSIS — I480 Paroxysmal atrial fibrillation: Secondary | ICD-10-CM

## 2021-08-07 DIAGNOSIS — G4700 Insomnia, unspecified: Secondary | ICD-10-CM

## 2021-08-07 LAB — CBC
HCT: 36.8 % — ABNORMAL LOW (ref 39.0–52.0)
Hemoglobin: 12.2 g/dL — ABNORMAL LOW (ref 13.0–17.0)
MCH: 30.5 pg (ref 26.0–34.0)
MCHC: 33.2 g/dL (ref 30.0–36.0)
MCV: 92 fL (ref 80.0–100.0)
Platelets: 272 10*3/uL (ref 150–400)
RBC: 4 MIL/uL — ABNORMAL LOW (ref 4.22–5.81)
RDW: 19.7 % — ABNORMAL HIGH (ref 11.5–15.5)
WBC: 20.6 10*3/uL — ABNORMAL HIGH (ref 4.0–10.5)
nRBC: 0 % (ref 0.0–0.2)

## 2021-08-07 LAB — COMPREHENSIVE METABOLIC PANEL
ALT: 38 U/L (ref 0–44)
AST: 25 U/L (ref 15–41)
Albumin: 2.9 g/dL — ABNORMAL LOW (ref 3.5–5.0)
Alkaline Phosphatase: 55 U/L (ref 38–126)
Anion gap: 6 (ref 5–15)
BUN: 15 mg/dL (ref 6–20)
CO2: 25 mmol/L (ref 22–32)
Calcium: 8.2 mg/dL — ABNORMAL LOW (ref 8.9–10.3)
Chloride: 109 mmol/L (ref 98–111)
Creatinine, Ser: 0.9 mg/dL (ref 0.61–1.24)
GFR, Estimated: >60 mL/min (ref >60)
Glucose, Bld: 107 mg/dL — ABNORMAL HIGH (ref 70–99)
Potassium: 3.3 mmol/L — ABNORMAL LOW (ref 3.5–5.1)
Sodium: 140 mmol/L (ref 135–145)
Total Bilirubin: 1.1 mg/dL (ref 0.3–1.2)
Total Protein: 5.4 g/dL — ABNORMAL LOW (ref 6.5–8.1)

## 2021-08-07 LAB — HEMOGLOBIN A1C
Hgb A1c MFr Bld: 5.7 % — ABNORMAL HIGH (ref 4.8–5.6)
Mean Plasma Glucose: 116.89 mg/dL

## 2021-08-07 LAB — RESP PANEL BY RT-PCR (FLU A&B, COVID) ARPGX2
Influenza A by PCR: NEGATIVE
Influenza B by PCR: NEGATIVE
SARS Coronavirus 2 by RT PCR: POSITIVE — AB

## 2021-08-07 LAB — HIV ANTIBODY (ROUTINE TESTING W REFLEX): HIV Screen 4th Generation wRfx: NONREACTIVE

## 2021-08-07 LAB — APTT: aPTT: 39 seconds — ABNORMAL HIGH (ref 24–36)

## 2021-08-07 LAB — MAGNESIUM: Magnesium: 1.2 mg/dL — ABNORMAL LOW (ref 1.7–2.4)

## 2021-08-07 LAB — PHOSPHORUS: Phosphorus: 3.1 mg/dL (ref 2.5–4.6)

## 2021-08-07 LAB — MRSA NEXT GEN BY PCR, NASAL: MRSA by PCR Next Gen: DETECTED — AB

## 2021-08-07 MED ORDER — HYDROCODONE-ACETAMINOPHEN 5-325 MG PO TABS
1.0000 | ORAL_TABLET | ORAL | Status: DC | PRN
  Administered 2021-08-07 – 2021-08-10 (×11): 1 via ORAL
  Filled 2021-08-07 (×11): qty 1

## 2021-08-07 MED ORDER — METHOTREXATE 2.5 MG PO TABS: 20.0000 mg | ORAL_TABLET | ORAL | Status: DC

## 2021-08-07 MED ORDER — VANCOMYCIN HCL 1500 MG/300ML IV SOLN
1500.0000 mg | Freq: Once | INTRAVENOUS | Status: AC
  Administered 2021-08-07: 1500 mg via INTRAVENOUS
  Filled 2021-08-07: qty 300

## 2021-08-07 MED ORDER — POTASSIUM CHLORIDE CRYS ER 20 MEQ PO TBCR
40.0000 meq | EXTENDED_RELEASE_TABLET | Freq: Once | ORAL | Status: AC
  Administered 2021-08-07: 40 meq via ORAL
  Filled 2021-08-07: qty 2

## 2021-08-07 MED ORDER — ZOLPIDEM TARTRATE 5 MG PO TABS
10.0000 mg | ORAL_TABLET | Freq: Every evening | ORAL | Status: DC | PRN
  Administered 2021-08-07 – 2021-08-09 (×4): 10 mg via ORAL
  Filled 2021-08-07 (×4): qty 2

## 2021-08-07 MED ORDER — MONTELUKAST SODIUM 10 MG PO TABS
10.0000 mg | ORAL_TABLET | Freq: Every day | ORAL | Status: DC
  Administered 2021-08-07 – 2021-08-09 (×3): 10 mg via ORAL
  Filled 2021-08-07 (×3): qty 1

## 2021-08-07 MED ORDER — MAGNESIUM SULFATE 2 GM/50ML IV SOLN
2.0000 g | Freq: Once | INTRAVENOUS | Status: AC
  Administered 2021-08-07: 2 g via INTRAVENOUS
  Filled 2021-08-07: qty 50

## 2021-08-07 MED ORDER — SILVER SULFADIAZINE 1 % EX CREA
TOPICAL_CREAM | Freq: Two times a day (BID) | CUTANEOUS | Status: DC
  Filled 2021-08-07: qty 85

## 2021-08-07 MED ORDER — ATORVASTATIN CALCIUM 40 MG PO TABS
80.0000 mg | ORAL_TABLET | Freq: Every day | ORAL | Status: DC
  Administered 2021-08-07 – 2021-08-09 (×3): 80 mg via ORAL
  Filled 2021-08-07 (×4): qty 2

## 2021-08-07 MED ORDER — LISINOPRIL 10 MG PO TABS
20.0000 mg | ORAL_TABLET | Freq: Every day | ORAL | Status: DC
  Administered 2021-08-07 – 2021-08-10 (×4): 20 mg via ORAL
  Filled 2021-08-07 (×4): qty 2

## 2021-08-07 MED ORDER — PANTOPRAZOLE SODIUM 40 MG PO TBEC
40.0000 mg | DELAYED_RELEASE_TABLET | Freq: Every day | ORAL | Status: DC
  Administered 2021-08-07 – 2021-08-10 (×4): 40 mg via ORAL
  Filled 2021-08-07 (×4): qty 1

## 2021-08-07 MED ORDER — MUPIROCIN 2 % EX OINT
TOPICAL_OINTMENT | Freq: Two times a day (BID) | CUTANEOUS | Status: DC
  Filled 2021-08-07 (×2): qty 22

## 2021-08-07 MED ORDER — MOMETASONE FURO-FORMOTEROL FUM 200-5 MCG/ACT IN AERO
2.0000 | INHALATION_SPRAY | Freq: Two times a day (BID) | RESPIRATORY_TRACT | Status: DC
Start: 2021-08-07 — End: 2021-08-10
  Administered 2021-08-07 – 2021-08-10 (×8): 2 via RESPIRATORY_TRACT
  Filled 2021-08-07: qty 8.8

## 2021-08-07 MED ORDER — TRAMADOL HCL 50 MG PO TABS
50.0000 mg | ORAL_TABLET | Freq: Four times a day (QID) | ORAL | Status: DC | PRN
  Administered 2021-08-07 (×2): 50 mg via ORAL
  Filled 2021-08-07 (×2): qty 1

## 2021-08-07 MED ORDER — FOLIC ACID 1 MG PO TABS
1.0000 mg | ORAL_TABLET | Freq: Every day | ORAL | Status: DC
  Administered 2021-08-07 – 2021-08-10 (×4): 1 mg via ORAL
  Filled 2021-08-07 (×4): qty 1

## 2021-08-07 MED ORDER — VANCOMYCIN HCL 1250 MG/250ML IV SOLN
1250.0000 mg | Freq: Two times a day (BID) | INTRAVENOUS | Status: DC
  Administered 2021-08-07 – 2021-08-10 (×7): 1250 mg via INTRAVENOUS
  Filled 2021-08-07 (×8): qty 250

## 2021-08-07 MED ORDER — GLUCERNA SHAKE PO LIQD
237.0000 mL | Freq: Three times a day (TID) | ORAL | Status: DC
  Administered 2021-08-07 – 2021-08-10 (×10): 237 mL via ORAL
  Filled 2021-08-07 (×2): qty 237

## 2021-08-07 NOTE — Consult Note (Signed)
WOC Nurse Consult Note: Reason for Consult:Right lateral LE full thickness wound with surrounding erythema, edema Wound type:Trauma (patient was cut by a piece of metal, had sutures) Pressure Injury POA:N/A Measurement:To be obtained by the Bedside RN today and entered into the Nursing Flow Sheet Wound MLJ:QGBEEF, moist Drainage (amount, consistency, odor) serosanguinous in a small amount Periwound:edematous, erythematous.  See also photos attached by Dr. Arbutus Leas to the EMR today Dressing procedure/placement/frequency: Patient has no known drug allergies; I have provided nursing with guidance for twice daily topical care using silver sulfadiazine cream topped with a NS gauze (moistened) followed by dry gauze ABD and secures by wrapping a Kerlix roll gauze from toes to knee.  The Kerlix is to be topped with an ACE bandage. Heel is to be floated. I have communicated to Dr. Arbutus Leas via Secure Chat that my recommendation is to consult surgery on this wound for their consideration of debridement. Any orders I have initiated are to be superceded by theirs as I defer to their expertise.  WOC nursing team will not follow, but will remain available to this patient, the nursing and medical teams.  Please re-consult if needed. Thanks, Ladona Mow, MSN, RN, GNP, [place]  Pager# [phone]

## 2021-08-07 NOTE — Assessment & Plan Note (Signed)
-  Continue Lipitor °

## 2021-08-07 NOTE — Assessment & Plan Note (Addendum)
repleted ?

## 2021-08-07 NOTE — Assessment & Plan Note (Signed)
Currently in sinus rhythm Previously on apixaban, but patient stopped in October 2022 secondary nosebleeds Continue aspirin 81 mg daily Echocardiogram done on 09/27/2019 showed LVEF of 55 to 60%. LV has no RWMA.  LV diastolic parameters were normal.

## 2021-08-07 NOTE — Hospital Course (Signed)
61 year old male with a history of hypertension, diabetes mellitus, paroxysmal atrial fibrillation, asthma, OSA, hyperlipidemia presenting with 1 day history of increasing pain, erythema, and edema about his right leg.  The patient states that he was hit by accident with a piece of metal at work 17 days prior to this admission.  He stated that he had sutures placed on the same day at work.  He states that he had the sutures in place for 14 days.  During the 14-day period, the patient did have some mild edema and clear yellow drainage from her nose right lower extremity.  However, he did not note any erythema or purulence at that time.  He was also prescribed a 10-day course of cephalexin which she finished on 08/01/2021.  His stitches were removed on 08/04/2021 during which time he he noticed that his wound " opened up again" on his right pretibial area.  He noted that there was continued clear yellow fluid drainage.  After the stitches were removed, his workplace placed some type of silver embedded dressing into the wound on a daily basis.  On 08/06/2021 he noted increasing pain, erythema, and edema about his right leg.  As result, he came to the emergency department for further evaluation.  He denies any fevers, chills, headache, chest pain, short of breath, coughing, hemoptysis, nausea, vomiting, diarrhea, abdominal pain. In the ED, the patient had low-grade temperature 99.0 F.  He was hemodynamically stable with oxygen saturation 97-98% on room air.  WBC 24.3, hemoglobin 13.7, platelets 272,000.  BMP showed sodium 141, potassium 3.0, serum creatinine 1.03.  X-ray of the right tib/fib area showed soft tissue edema without any osseous abnormality.  Patient was started intravenous vancomycin.  Lactic acid was 1.6.

## 2021-08-07 NOTE — Progress Notes (Signed)
PROGRESS NOTE  [name] [name] [id number] DOB: 05/21/1961 DOA: 08/06/2021 PCP: Andres Shad, MD  Brief History:  61 year old male with a history of hypertension, diabetes mellitus, paroxysmal atrial fibrillation, asthma, OSA, hyperlipidemia presenting with 1 day history of increasing pain, erythema, and edema about his right leg.  The patient states that he was hit by accident with a piece of metal at work 17 days prior to this admission.  He stated that he had sutures placed on the same day at work.  He states that he had the sutures in place for 14 days.  During the 14-day period, the patient did have some mild edema and clear yellow drainage from her nose right lower extremity.  However, he did not note any erythema or purulence at that time.  He was also prescribed a 10-day course of cephalexin which she finished on 08/01/2021.  His stitches were removed on 08/04/2021 during which time he he noticed that his wound " opened up again" on his right pretibial area.  He noted that there was continued clear yellow fluid drainage.  After the stitches were removed, his workplace placed some type of silver embedded dressing into the wound on a daily basis.  On 08/06/2021 he noted increasing pain, erythema, and edema about his right leg.  As result, he came to the emergency department for further evaluation.  He denies any fevers, chills, headache, chest pain, short of breath, coughing, hemoptysis, nausea, vomiting, diarrhea, abdominal pain. In the ED, the patient had low-grade temperature 99.0 F.  He was hemodynamically stable with oxygen saturation 97-98% on room air.  WBC 24.3, hemoglobin 13.7, platelets 272,000.  BMP showed sodium 141, potassium 3.0, serum creatinine 1.03.  X-ray of the right tib/fib area showed soft tissue edema without any osseous abnormality.  Patient was started intravenous vancomycin.  Lactic acid was 1.6.     Assessment and Plan: * Cellulitis of right lower leg-  (present on admission) Continue vancomycin A.m. CBC Wound care consult--please see pictures attached to this progress note Add norco  Hypomagnesemia replete  Obesity, Class III, BMI 40-49.9 (morbid obesity) (HCC) BMI 48.35 Lifestyle modification  PAF (paroxysmal atrial fibrillation) (HCC) Currently in sinus rhythm Previously on apixaban, but patient stopped in October 2022 secondary nosebleeds Continue aspirin 81 mg daily Echocardiogram done on 09/27/2019 showed LVEF of 55 to 60%. LV has no RWMA.  LV diastolic parameters were normal.  Insomnia Continue Ambien per home regimen  Psoriatic arthritis (HCC) Holding methotrexate and Rinvoq due to current infection  Hypokalemia Replete Mag 1.2  GERD (gastroesophageal reflux disease) Continue Protonix  Asthma, chronic Stable on RA Continue Dulera and Singulair  Hyperlipemia- (present on admission) Continue Lipitor  OSA on CPAP Continue CPAP  Hypertension- (present on admission) Continue lisinopril         Status is: Inpatient Remains inpatient appropriate because: failure on oral abx, now requiring IV antibiotics            Family Communication:  no Family at bedside  Consultants:  none  Code Status:  FULL  DVT Prophylaxis:  [place] Lovenox   Procedures: As Listed in Progress Note Above  Antibiotics: Vanco 2/24>>      Subjective: Patient complains of pain in the right leg.  He denies any headache, chest pain, shortness of breath, nausea, vomiting, diarrhea, pain.  There is no fevers or chills.  Objective: Vitals:   08/07/21 0109 08/07/21 0515 08/07/21 0900 08/07/21 0913  BP: (!) 153/87 118/85 (!) 164/92   Pulse: 66 84    Resp:      Temp: 98.8 F (37.1 C) 98.8 F (37.1 C)    TempSrc: Oral Oral    SpO2: 100% 97%  98%  Weight: (!) 148.5 kg     Height:        Intake/Output Summary (Last 24 hours) at 08/07/2021 1357 Last data filed at 08/07/2021 1300 Gross per 24 hour  Intake 894.65  ml  Output 300 ml  Net 594.65 ml   Weight change:  Exam:  General:  Pt is alert, follows commands appropriately, not in acute distress HEENT: No icterus, No thrush, No neck mass, [place]/AT Cardiovascular: RRR, S1/S2, no rubs, no gallops Respiratory: CTA bilaterally, no wheezing, no crackles, no rhonchi Abdomen: Soft/+BS, non tender, non distended, no guarding Extremities: RIGHT LEG Edema, erythema, and tenderness to palpation on the right lower extremity pretibial area.  see pictures below           Data Reviewed: I have personally reviewed following labs and imaging studies Basic Metabolic Panel: Recent Labs  Lab 08/06/21 2018 08/07/21 0526  NA 141 140  K 3.0* 3.3*  CL 109 109  CO2 25 25  GLUCOSE 150* 107*  BUN 16 15  CREATININE 1.03 0.90  CALCIUM 8.5* 8.2*  MG  --  1.2*  PHOS  --  3.1   Liver Function Tests: Recent Labs  Lab 08/06/21 2018 08/07/21 0526  AST 34 25  ALT 48* 38  ALKPHOS 65 55  BILITOT 1.4* 1.1  PROT 6.3* 5.4*  ALBUMIN 3.4* 2.9*   No results for input(s): LIPASE, AMYLASE in the last 168 hours. No results for input(s): AMMONIA in the last 168 hours. Coagulation Profile: No results for input(s): INR, PROTIME in the last 168 hours. CBC: Recent Labs  Lab 08/06/21 2018 08/07/21 0526  WBC 24.3* 20.6*  NEUTROABS 22.1*  --   HGB 13.7 12.2*  HCT 41.4 36.8*  MCV 91.0 92.0  PLT 316 272   Cardiac Enzymes: No results for input(s): CKTOTAL, CKMB, CKMBINDEX, TROPONINI in the last 168 hours. BNP: Invalid input(s): POCBNP CBG: No results for input(s): GLUCAP in the last 168 hours. HbA1C: No results for input(s): HGBA1C in the last 72 hours. Urine analysis:    Component Value Date/Time   COLORURINE YELLOW 12/11/2020 1826   APPEARANCEUR HAZY (A) 12/11/2020 1826   LABSPEC 1.017 12/11/2020 1826   PHURINE 5.0 12/11/2020 1826   GLUCOSEU NEGATIVE 12/11/2020 1826   HGBUR NEGATIVE 12/11/2020 1826   BILIRUBINUR NEGATIVE 12/11/2020 1826    KETONESUR 20 (A) 12/11/2020 1826   PROTEINUR 30 (A) 12/11/2020 1826   UROBILINOGEN 0.2 03/14/2014 0100   NITRITE NEGATIVE 12/11/2020 1826   LEUKOCYTESUR NEGATIVE 12/11/2020 1826   Sepsis Labs: @LABRCNTIP (procalcitonin:4,lacticidven:4) ) Recent Results (from the past 240 hour(s))  Resp Panel by RT-PCR (Flu A&B, Covid) Nasopharyngeal Swab     Status: Abnormal   Collection Time: 08/07/21 12:28 AM   Specimen: Nasopharyngeal Swab; Nasopharyngeal(NP) swabs in vial transport medium  Result Value Ref Range Status   SARS Coronavirus 2 by RT PCR POSITIVE (A) NEGATIVE Final    Comment: (NOTE) SARS-CoV-2 target nucleic acids are DETECTED.  The SARS-CoV-2 RNA is generally detectable in upper respiratory specimens during the acute phase of infection. Positive results are indicative of the presence of the identified virus, but do not rule out bacterial infection or co-infection with other pathogens not detected by the test. Clinical correlation with patient history  and other diagnostic information is necessary to determine patient infection status. The expected result is Negative.  Fact Sheet for Patients: EntrepreneurPulse.com.au  Fact Sheet for Healthcare Providers: IncredibleEmployment.be  This test is not yet approved or cleared by the [place] FDA and  has been authorized for detection and/or diagnosis of SARS-CoV-2 by FDA under an Emergency Use Authorization (EUA).  This EUA will remain in effect (meaning this test can be used) for the duration of  the COVID-19 declaration under Section 564(b)(1) of the A ct, 21 U.S.C. section 360bbb-3(b)(1), unless the authorization is terminated or revoked sooner.     Influenza A by PCR NEGATIVE NEGATIVE Final   Influenza B by PCR NEGATIVE NEGATIVE Final    Comment: (NOTE) The Xpert Xpress SARS-CoV-2/FLU/RSV plus assay is intended as an aid in the diagnosis of influenza from Nasopharyngeal swab specimens  and should not be used as a sole basis for treatment. Nasal washings and aspirates are unacceptable for Xpert Xpress SARS-CoV-2/FLU/RSV testing.  Fact Sheet for Patients: EntrepreneurPulse.com.au  Fact Sheet for Healthcare Providers: IncredibleEmployment.be  This test is not yet approved or cleared by the [place] FDA and has been authorized for detection and/or diagnosis of SARS-CoV-2 by FDA under an Emergency Use Authorization (EUA). This EUA will remain in effect (meaning this test can be used) for the duration of the COVID-19 declaration under Section 564(b)(1) of the Act, 21 U.S.C. section 360bbb-3(b)(1), unless the authorization is terminated or revoked.  Performed at [facility], [address] [place], [place] [address]   MRSA Next Gen by PCR, Nasal     Status: Abnormal   Collection Time: 08/07/21  2:33 AM   Specimen: Nasal Mucosa; Nasal Swab  Result Value Ref Range Status   MRSA by PCR Next Gen DETECTED (A) NOT DETECTED Final    Comment: RESULT CALLED TO, READ BACK BY AND VERIFIED WITH: RACHEL @ 1228 ON [id number] BY HENDERSON L (NOTE) The GeneXpert MRSA Assay (FDA approved for NASAL specimens only), is one component of a comprehensive MRSA colonization surveillance program. It is not intended to diagnose MRSA infection nor to guide or monitor treatment for MRSA infections. Test performance is not FDA approved in patients less than 49 years old. Performed at [facility], [address] [place], [place] [address]      Scheduled Meds:  atorvastatin  80 mg Oral q1800   enoxaparin (LOVENOX) injection  70 mg Subcutaneous Q24H   feeding supplement (GLUCERNA SHAKE)  237 mL Oral TID BM   folic acid  1 mg Oral Daily   lisinopril  20 mg Oral Daily   mometasone-formoterol  2 puff Inhalation BID   montelukast  10 mg Oral QHS   pantoprazole  40 mg Oral Daily   Continuous Infusions:  vancomycin 1,250 mg (08/07/21 1136)     Procedures/Studies: DG Tibia/Fibula Right  Result Date: 08/06/2021 CLINICAL DATA:  Pain and swelling EXAM: RIGHT TIBIA AND FIBULA - 2 VIEW COMPARISON:  None. FINDINGS: Report no fracture or malalignment. Considerable soft tissue swelling without emphysema. Rectangular shaped opacity at the distal lower leg on the lateral side. Wound or ulcer anteriorly at the distal lower leg. No periostitis or osseous destructive change IMPRESSION: Considerable soft tissue swelling without acute osseous abnormality. Wound or ulcer anteriorly at the distal lower leg. Rectangular shaped opacity at the distal lower leg on the lateral side, frontal view, question external artifact, correlate with direct inspection Electronically Signed   By: Donavan Foil M.D.   On: 08/06/2021  20:49    Orson Eva, DO  [facility]  If 7PM-7AM, please contact night-coverage [email or web address] Password [email or web address] 08/07/2021, 1:57 PM   LOS: 1 day

## 2021-08-07 NOTE — Assessment & Plan Note (Deleted)
Albumin 3.4, this is possibly secondary to mild protein calorie malnutrition Protein supplement to be provided

## 2021-08-07 NOTE — Assessment & Plan Note (Addendum)
Holding methotrexate and Rinvoq due to current infection

## 2021-08-07 NOTE — Assessment & Plan Note (Signed)
Continue Protonix °

## 2021-08-07 NOTE — Progress Notes (Signed)
Pharmacy Antibiotic Note  [name] [name] is a 61 y.o. male admitted on 08/06/2021 with cellulitis / possible wound infection.  Pharmacy has been consulted for Vancomycin dosing.  Plan: Vancomycin 2500mg  loading dose Vancomycin 1250mg  IV q12h Calc AUC 498 (goal 400-550) SCr 1.03, Vd 0.5 due to BMI >30  Height: 5\' 9"  (175.3 cm) Weight: (!) 146.6 kg (323 lb 3.1 oz) IBW/kg (Calculated) : 70.7  Temp (24hrs), Avg:99 F (37.2 C), Min:99 F (37.2 C), Max:99 F (37.2 C)  Recent Labs  Lab 08/06/21 2017 08/06/21 2018 08/06/21 2214  WBC  --  24.3*  --   CREATININE  --  1.03  --   LATICACIDVEN 1.8  --  1.6    Estimated Creatinine Clearance: 110.4 mL/min (by C-G formula based on SCr of 1.03 mg/dL).    No Known Allergies  Antimicrobials this admission: Vancomycin 2/25 >>  Dose adjustments this admission:   Microbiology results:   Thank you for allowing pharmacy to be a part of this patients care.  [employer], Pharm.D., BCPS Clinical Pharmacist  **Pharmacist phone directory can be found on [email or web address] listed under [place].  08/07/2021 12:19 AM

## 2021-08-07 NOTE — Assessment & Plan Note (Signed)
Continue lisinopril

## 2021-08-07 NOTE — Assessment & Plan Note (Addendum)
Continued vancomycin during the hospitalization WBC 24.3>>>13.0 Wound care consult appreciated Conitnue norco prn pain 2/26 CT right tib/fib--No organized or rim-enhancing fluid collection. No soft tissue gas D/c home with doxycycline and cephalexin x 6 more days Outpatient referral to wound care clinic in [place] See pics below on day of d/c Continued gradual improvement daily

## 2021-08-07 NOTE — Assessment & Plan Note (Signed)
BMI 48.35 Lifestyle modification

## 2021-08-07 NOTE — Assessment & Plan Note (Addendum)
Repleted. °

## 2021-08-07 NOTE — Assessment & Plan Note (Signed)
Continue CPAP.  

## 2021-08-07 NOTE — Assessment & Plan Note (Deleted)
CBG at 150, hemoglobin A1c about 1 year ago was 6.6 Continue to monitor blood glucose level, repeat hemoglobin A1c and consider starting patient on ISS and hypoglycemia protocol based on findings Patient was currently not on any antidiabetic medications

## 2021-08-07 NOTE — Assessment & Plan Note (Signed)
Continue Ambien per home regimen

## 2021-08-07 NOTE — Assessment & Plan Note (Deleted)
BMI 47.73, diet and lifestyle modification encouraged Patient will need to follow-up with outpatient PCP for weight control program

## 2021-08-07 NOTE — Assessment & Plan Note (Deleted)
Patient with history of chronic A-fib CHADs2Vasc score was at least 1 Last follow-up with cardiologist's office was in October 2022, where it was realized that patient was not on Eliquis due to significant nosebleeds.   Patient states that he was taking off all meds since his EKG(03/26/2021) was normal at a rate of 75. Echocardiogram done on 09/27/2019 showed LVEF of 55 to 60%. LV has no RWMA.  LV diastolic parameters were normal. EKG done in the ED today showed normal sinus rhythm at a rate of 71 bpm

## 2021-08-07 NOTE — Assessment & Plan Note (Deleted)
WBC 24.3, this may be related to patient's right leg wound infection/cellulitis Continue antibiotic treatment as described for cellulitis

## 2021-08-07 NOTE — Assessment & Plan Note (Addendum)
Stable on RA Continue Dulera and Singulair

## 2021-08-08 ENCOUNTER — Inpatient Hospital Stay (HOSPITAL_COMMUNITY): Payer: No Typology Code available for payment source

## 2021-08-08 DIAGNOSIS — L4050 Arthropathic psoriasis, unspecified: Secondary | ICD-10-CM

## 2021-08-08 DIAGNOSIS — R7302 Impaired glucose tolerance (oral): Secondary | ICD-10-CM

## 2021-08-08 LAB — BASIC METABOLIC PANEL
Anion gap: 6 (ref 5–15)
BUN: 9 mg/dL (ref 6–20)
CO2: 26 mmol/L (ref 22–32)
Calcium: 8.3 mg/dL — ABNORMAL LOW (ref 8.9–10.3)
Chloride: 107 mmol/L (ref 98–111)
Creatinine, Ser: 0.92 mg/dL (ref 0.61–1.24)
GFR, Estimated: >60 mL/min (ref >60)
Glucose, Bld: 132 mg/dL — ABNORMAL HIGH (ref 70–99)
Potassium: 3.8 mmol/L (ref 3.5–5.1)
Sodium: 139 mmol/L (ref 135–145)

## 2021-08-08 LAB — CBC
HCT: 35.2 % — ABNORMAL LOW (ref 39.0–52.0)
Hemoglobin: 11.6 g/dL — ABNORMAL LOW (ref 13.0–17.0)
MCH: 30.4 pg (ref 26.0–34.0)
MCHC: 33 g/dL (ref 30.0–36.0)
MCV: 92.4 fL (ref 80.0–100.0)
Platelets: 258 10*3/uL (ref 150–400)
RBC: 3.81 MIL/uL — ABNORMAL LOW (ref 4.22–5.81)
RDW: 19.5 % — ABNORMAL HIGH (ref 11.5–15.5)
WBC: 19.8 10*3/uL — ABNORMAL HIGH (ref 4.0–10.5)
nRBC: 0 % (ref 0.0–0.2)

## 2021-08-08 LAB — HEMOGLOBIN A1C
Hgb A1c MFr Bld: 5.7 % — ABNORMAL HIGH (ref 4.8–5.6)
Mean Plasma Glucose: 116.89 mg/dL

## 2021-08-08 LAB — MAGNESIUM: Magnesium: 1.7 mg/dL (ref 1.7–2.4)

## 2021-08-08 MED ORDER — IOHEXOL 300 MG/ML  SOLN
100.0000 mL | Freq: Once | INTRAMUSCULAR | Status: AC | PRN
  Administered 2021-08-08: 100 mL via INTRAVENOUS

## 2021-08-08 MED ORDER — MAGNESIUM SULFATE 2 GM/50ML IV SOLN
2.0000 g | Freq: Once | INTRAVENOUS | Status: AC
  Administered 2021-08-08: 2 g via INTRAVENOUS
  Filled 2021-08-08: qty 50

## 2021-08-08 NOTE — Progress Notes (Addendum)
PROGRESS NOTE  [name] [name] [id number] DOB: 02-26-1961 DOA: 08/06/2021 PCP: Andres Shad, MD  Brief History:  61 year old male with a history of hypertension, diabetes mellitus, paroxysmal atrial fibrillation, asthma, OSA, hyperlipidemia presenting with 1 day history of increasing pain, erythema, and edema about his right leg.  The patient states that he was hit by accident with a piece of metal at work 17 days prior to this admission.  He stated that he had sutures placed on the same day at work.  He states that he had the sutures in place for 14 days.  During the 14-day period, the patient did have some mild edema and clear yellow drainage from her nose right lower extremity.  However, he did not note any erythema or purulence at that time.  He was also prescribed a 10-day course of cephalexin which she finished on 08/01/2021.  His stitches were removed on 08/04/2021 during which time he he noticed that his wound " opened up again" on his right pretibial area.  He noted that there was continued clear yellow fluid drainage.  After the stitches were removed, his workplace placed some type of silver embedded dressing into the wound on a daily basis.  On 08/06/2021 he noted increasing pain, erythema, and edema about his right leg.  As result, he came to the emergency department for further evaluation.  He denies any fevers, chills, headache, chest pain, short of breath, coughing, hemoptysis, nausea, vomiting, diarrhea, abdominal pain. In the ED, the patient had low-grade temperature 99.0 F.  He was hemodynamically stable with oxygen saturation 97-98% on room air.  WBC 24.3, hemoglobin 13.7, platelets 272,000.  BMP showed sodium 141, potassium 3.0, serum creatinine 1.03.  X-ray of the right tib/fib area showed soft tissue edema without any osseous abnormality.  Patient was started intravenous vancomycin.  Lactic acid was 1.6.     Assessment and Plan: * Cellulitis of right lower leg-  (present on admission) Continue vancomycin A.m. CBC--trending down Wound care consult appreciated Conitnue norco prn pain CT right tib/fib  Impaired glucose tolerance 08/07/21 A1C--5.7 Lifestyle modification  Hypomagnesemia repleted  Obesity, Class III, BMI 40-49.9 (morbid obesity) (HCC) BMI 48.35 Lifestyle modification  PAF (paroxysmal atrial fibrillation) (HCC) Currently in sinus rhythm Previously on apixaban, but patient stopped in October 2022 secondary nosebleeds Continue aspirin 81 mg daily Echocardiogram done on 09/27/2019 showed LVEF of 55 to 60%. LV has no RWMA.  LV diastolic parameters were normal.  Insomnia Continue Ambien per home regimen  Psoriatic arthritis (HCC) Holding methotrexate and Rinvoq due to current infection  Hypokalemia Repleted  GERD (gastroesophageal reflux disease) Continue Protonix  Asthma, chronic Stable on RA Continue Dulera and Singulair  Hyperlipemia- (present on admission) Continue Lipitor  OSA on CPAP Continue CPAP  Hypertension- (present on admission) Continue lisinopril   Status is: Inpatient Remains inpatient appropriate because: failure on oral abx, now requiring IV antibiotics           Family Communication:  spouse updated at bedside 2/26   Consultants:  none   Code Status:  FULL   DVT Prophylaxis:  [place] Lovenox     Procedures: As Listed in Progress Note Above   Antibiotics: Vanco 2/24>>             Subjective: Patient states pain in right leg is a little better and edema is a little better.  Denies f,c, cp, sob, cough, n/v/d, abd pain  Objective: Vitals:  08/07/21 2128 08/08/21 0511 08/08/21 0815 08/08/21 0852  BP: 140/63 (!) 151/76 (!) 160/81   Pulse: 71 72    Resp: 20 20    Temp: 99.2 F (37.3 C) 98.1 F (36.7 C)    TempSrc: Oral Oral    SpO2: 97% 99%  98%  Weight:      Height:        Intake/Output Summary (Last 24 hours) at 08/08/2021 1132 Last data filed at 08/08/2021  [id number] Gross per 24 hour  Intake 3122.84 ml  Output 802 ml  Net 2320.84 ml   Weight change:  Exam:  General:  Pt is alert, follows commands appropriately, not in acute distress HEENT: No icterus, No thrush, No neck mass, [place]/AT Cardiovascular: RRR, S1/S2, no rubs, no gallops Respiratory: CTA bilaterally, no wheezing, no crackles, no rhonchi Abdomen: Soft/+BS, non tender, non distended, no guarding Extremities: right lower extremity tib/fib area without necrosis or crepitance.  +erythema with blanchable and nonblanchable components.   Data Reviewed: I have personally reviewed following labs and imaging studies Basic Metabolic Panel: Recent Labs  Lab 08/06/21 2018 08/07/21 0526 08/08/21 0553  NA 141 140 139  K 3.0* 3.3* 3.8  CL 109 109 107  CO2 25 25 26   GLUCOSE 150* 107* 132*  BUN 16 15 9   CREATININE 1.03 0.90 0.92  CALCIUM 8.5* 8.2* 8.3*  MG  --  1.2* 1.7  PHOS  --  3.1  --    Liver Function Tests: Recent Labs  Lab 08/06/21 2018 08/07/21 0526  AST 34 25  ALT 48* 38  ALKPHOS 65 55  BILITOT 1.4* 1.1  PROT 6.3* 5.4*  ALBUMIN 3.4* 2.9*   No results for input(s): LIPASE, AMYLASE in the last 168 hours. No results for input(s): AMMONIA in the last 168 hours. Coagulation Profile: No results for input(s): INR, PROTIME in the last 168 hours. CBC: Recent Labs  Lab 08/06/21 2018 08/07/21 0526 08/08/21 0553  WBC 24.3* 20.6* 19.8*  NEUTROABS 22.1*  --   --   HGB 13.7 12.2* 11.6*  HCT 41.4 36.8* 35.2*  MCV 91.0 92.0 92.4  PLT 316 272 258   Cardiac Enzymes: No results for input(s): CKTOTAL, CKMB, CKMBINDEX, TROPONINI in the last 168 hours. BNP: Invalid input(s): POCBNP CBG: No results for input(s): GLUCAP in the last 168 hours. HbA1C: Recent Labs    08/07/21 0527  HGBA1C 5.7*   Urine analysis:    Component Value Date/Time   COLORURINE YELLOW 12/11/2020 1826   APPEARANCEUR HAZY (A) 12/11/2020 1826   LABSPEC 1.017 12/11/2020 1826   PHURINE 5.0 12/11/2020  1826   GLUCOSEU NEGATIVE 12/11/2020 1826   HGBUR NEGATIVE 12/11/2020 1826   BILIRUBINUR NEGATIVE 12/11/2020 1826   KETONESUR 20 (A) 12/11/2020 1826   PROTEINUR 30 (A) 12/11/2020 1826   UROBILINOGEN 0.2 03/14/2014 0100   NITRITE NEGATIVE 12/11/2020 1826   LEUKOCYTESUR NEGATIVE 12/11/2020 1826   Sepsis Labs: @LABRCNTIP (procalcitonin:4,lacticidven:4) ) Recent Results (from the past 240 hour(s))  Resp Panel by RT-PCR (Flu A&B, Covid) Nasopharyngeal Swab     Status: Abnormal   Collection Time: 08/07/21 12:28 AM   Specimen: Nasopharyngeal Swab; Nasopharyngeal(NP) swabs in vial transport medium  Result Value Ref Range Status   SARS Coronavirus 2 by RT PCR POSITIVE (A) NEGATIVE Final    Comment: (NOTE) SARS-CoV-2 target nucleic acids are DETECTED.  The SARS-CoV-2 RNA is generally detectable in upper respiratory specimens during the acute phase of infection. Positive results are indicative of the presence of the identified virus,  but do not rule out bacterial infection or co-infection with other pathogens not detected by the test. Clinical correlation with patient history and other diagnostic information is necessary to determine patient infection status. The expected result is Negative.  Fact Sheet for Patients: EntrepreneurPulse.com.au  Fact Sheet for Healthcare Providers: IncredibleEmployment.be  This test is not yet approved or cleared by the [place] FDA and  has been authorized for detection and/or diagnosis of SARS-CoV-2 by FDA under an Emergency Use Authorization (EUA).  This EUA will remain in effect (meaning this test can be used) for the duration of  the COVID-19 declaration under Section 564(b)(1) of the A ct, 21 U.S.C. section 360bbb-3(b)(1), unless the authorization is terminated or revoked sooner.     Influenza A by PCR NEGATIVE NEGATIVE Final   Influenza B by PCR NEGATIVE NEGATIVE Final    Comment: (NOTE) The Xpert Xpress  SARS-CoV-2/FLU/RSV plus assay is intended as an aid in the diagnosis of influenza from Nasopharyngeal swab specimens and should not be used as a sole basis for treatment. Nasal washings and aspirates are unacceptable for Xpert Xpress SARS-CoV-2/FLU/RSV testing.  Fact Sheet for Patients: EntrepreneurPulse.com.au  Fact Sheet for Healthcare Providers: IncredibleEmployment.be  This test is not yet approved or cleared by the [place] FDA and has been authorized for detection and/or diagnosis of SARS-CoV-2 by FDA under an Emergency Use Authorization (EUA). This EUA will remain in effect (meaning this test can be used) for the duration of the COVID-19 declaration under Section 564(b)(1) of the Act, 21 U.S.C. section 360bbb-3(b)(1), unless the authorization is terminated or revoked.  Performed at [facility], [address] [place], [place] [address]   MRSA Next Gen by PCR, Nasal     Status: Abnormal   Collection Time: 08/07/21  2:33 AM   Specimen: Nasal Mucosa; Nasal Swab  Result Value Ref Range Status   MRSA by PCR Next Gen DETECTED (A) NOT DETECTED Final    Comment: RESULT CALLED TO, READ BACK BY AND VERIFIED WITH: RACHEL @ 1228 ON [id number] BY HENDERSON L (NOTE) The GeneXpert MRSA Assay (FDA approved for NASAL specimens only), is one component of a comprehensive MRSA colonization surveillance program. It is not intended to diagnose MRSA infection nor to guide or monitor treatment for MRSA infections. Test performance is not FDA approved in patients less than 78 years old. Performed at [facility], [address] [place], [place] [address]      Scheduled Meds:  atorvastatin  80 mg Oral q1800   enoxaparin (LOVENOX) injection  70 mg Subcutaneous Q24H   feeding supplement (GLUCERNA SHAKE)  237 mL Oral TID BM   folic acid  1 mg Oral Daily   lisinopril  20 mg Oral Daily   mometasone-formoterol  2 puff Inhalation BID   montelukast  10 mg Oral  QHS   mupirocin ointment   Nasal BID   pantoprazole  40 mg Oral Daily   silver sulfADIAZINE   Topical BID   Continuous Infusions:  magnesium sulfate bolus IVPB     vancomycin 1,250 mg (08/08/21 1126)    Procedures/Studies: DG Tibia/Fibula Right  Result Date: 08/06/2021 CLINICAL DATA:  Pain and swelling EXAM: RIGHT TIBIA AND FIBULA - 2 VIEW COMPARISON:  None. FINDINGS: Report no fracture or malalignment. Considerable soft tissue swelling without emphysema. Rectangular shaped opacity at the distal lower leg on the lateral side. Wound or ulcer anteriorly at the distal lower leg. No periostitis or osseous destructive change IMPRESSION: Considerable soft tissue swelling without  acute osseous abnormality. Wound or ulcer anteriorly at the distal lower leg. Rectangular shaped opacity at the distal lower leg on the lateral side, frontal view, question external artifact, correlate with direct inspection Electronically Signed   By: Donavan Foil M.D.   On: 08/06/2021 20:49   US Venous Img Lower Unilateral Right (DVT)  Result Date: 08/08/2021 CLINICAL DATA:  RIGHT open calf wound with redness x2 weeks. EXAM: RIGHT LOWER EXTREMITY VENOUS DOPPLER ULTRASOUND TECHNIQUE: Gray-scale sonography with compression, as well as color and duplex ultrasound, were performed to evaluate the deep venous system(s) from the level of the common femoral vein through the popliteal and proximal calf veins. COMPARISON:  RIGHT lower extremity XRs, 08/06/2021. FINDINGS: Suboptimal evaluation, secondary to distal RIGHT lower extremity bandages limiting the assessment. VENOUS Normal compressibility of the RIGHT common femoral, superficial femoral, and popliteal veins, as well as the portions of the visualized calf veins. Visualized portions of profunda femoral vein and great saphenous vein unremarkable. No filling defects to suggest DVT on grayscale or color Doppler imaging. Doppler waveforms show normal direction of venous flow, normal  respiratory plasticity and response to augmentation. Limited views of the contralateral common femoral vein are unremarkable. OTHER No evidence of superficial thrombophlebitis or abnormal fluid collection. Distal RIGHT lower extremity edema. Limitations: none IMPRESSION: Suboptimal evaluation, within these constraints; No evidence of femoropopliteal DVT within the RIGHT lower extremity. Michaelle Birks, MD [facility] Electronically Signed   By: Michaelle Birks M.D.   On: 08/08/2021 11:26    Orson Eva, DO  [facility]  If 7PM-7AM, please contact night-coverage [email or web address] Password [facility] 08/08/2021, 11:32 AM   LOS: 2 days

## 2021-08-08 NOTE — Plan of Care (Signed)

## 2021-08-08 NOTE — TOC Progression Note (Signed)
Transition of Care [facility]) - Progression Note    Patient Details  Name: Gabel Ramierz MRN: [id number] Date of Birth: 01/01/1961  Transition of Care [facility]) CM/SW Contact  Salome Arnt, Aubrey Phone Number: 08/08/2021, 11:06 AM  Clinical Narrative:   Transition of Care [facility]) Screening Note   Patient Details  Name: Jaycee Labarr Date of Birth: 09/11/1960   Transition of Care [facility]) CM/SW Contact:    Salome Arnt, Shelby Phone Number: 08/08/2021, 11:06 AM    Transition of Care Department [facility]) has reviewed patient and no TOC needs have been identified at this time. We will continue to monitor patient advancement through interdisciplinary progression rounds. If new patient transition needs arise, please place a TOC consult.         Barriers to Discharge: Continued Medical Work up  Expected Discharge Plan and Services                                                 Social Determinants of Health (SDOH) Interventions    Readmission Risk Interventions No flowsheet data found.

## 2021-08-08 NOTE — Assessment & Plan Note (Signed)
08/07/21 A1C--5.7 Lifestyle modification

## 2021-08-09 LAB — BASIC METABOLIC PANEL
Anion gap: 8 (ref 5–15)
BUN: 9 mg/dL (ref 6–20)
CO2: 24 mmol/L (ref 22–32)
Calcium: 8.1 mg/dL — ABNORMAL LOW (ref 8.9–10.3)
Chloride: 108 mmol/L (ref 98–111)
Creatinine, Ser: 0.89 mg/dL (ref 0.61–1.24)
GFR, Estimated: >60 mL/min (ref >60)
Glucose, Bld: 128 mg/dL — ABNORMAL HIGH (ref 70–99)
Potassium: 3.7 mmol/L (ref 3.5–5.1)
Sodium: 140 mmol/L (ref 135–145)

## 2021-08-09 LAB — CBC
HCT: 34.5 % — ABNORMAL LOW (ref 39.0–52.0)
Hemoglobin: 10.9 g/dL — ABNORMAL LOW (ref 13.0–17.0)
MCH: 29 pg (ref 26.0–34.0)
MCHC: 31.6 g/dL (ref 30.0–36.0)
MCV: 91.8 fL (ref 80.0–100.0)
Platelets: 251 10*3/uL (ref 150–400)
RBC: 3.76 MIL/uL — ABNORMAL LOW (ref 4.22–5.81)
RDW: 19.3 % — ABNORMAL HIGH (ref 11.5–15.5)
WBC: 16 10*3/uL — ABNORMAL HIGH (ref 4.0–10.5)
nRBC: 0 % (ref 0.0–0.2)

## 2021-08-09 LAB — MAGNESIUM: Magnesium: 1.9 mg/dL (ref 1.7–2.4)

## 2021-08-09 MED ORDER — MAGNESIUM OXIDE -MG SUPPLEMENT 400 (240 MG) MG PO TABS
400.0000 mg | ORAL_TABLET | Freq: Every day | ORAL | Status: DC
  Administered 2021-08-09 – 2021-08-10 (×2): 400 mg via ORAL
  Filled 2021-08-09 (×2): qty 1

## 2021-08-09 NOTE — Progress Notes (Signed)
PROGRESS NOTE  [name] [name] [id number] DOB: 19-Aug-1960 DOA: 08/06/2021 PCP: Andres Shad, MD  Brief History:  61 year old male with a history of hypertension, diabetes mellitus, paroxysmal atrial fibrillation, asthma, OSA, hyperlipidemia presenting with 1 day history of increasing pain, erythema, and edema about his right leg.  The patient states that he was hit by accident with a piece of metal at work 17 days prior to this admission.  He stated that he had sutures placed on the same day at work.  He states that he had the sutures in place for 14 days.  During the 14-day period, the patient did have some mild edema and clear yellow drainage from her nose right lower extremity.  However, he did not note any erythema or purulence at that time.  He was also prescribed a 10-day course of cephalexin which she finished on 08/01/2021.  His stitches were removed on 08/04/2021 during which time he he noticed that his wound " opened up again" on his right pretibial area.  He noted that there was continued clear yellow fluid drainage.  After the stitches were removed, his workplace placed some type of silver embedded dressing into the wound on a daily basis.  On 08/06/2021 he noted increasing pain, erythema, and edema about his right leg.  As result, he came to the emergency department for further evaluation.  He denies any fevers, chills, headache, chest pain, short of breath, coughing, hemoptysis, nausea, vomiting, diarrhea, abdominal pain. In the ED, the patient had low-grade temperature 99.0 F.  He was hemodynamically stable with oxygen saturation 97-98% on room air.  WBC 24.3, hemoglobin 13.7, platelets 272,000.  BMP showed sodium 141, potassium 3.0, serum creatinine 1.03.  X-ray of the right tib/fib area showed soft tissue edema without any osseous abnormality.  Patient was started intravenous vancomycin.  Lactic acid was 1.6.     Assessment and Plan: * Cellulitis of right lower leg-  (present on admission) Continue vancomycin A.m. CBC--trending down Wound care consult appreciated Conitnue norco prn pain 2/26 CT right tib/fib--No organized or rim-enhancing fluid collection. No soft tissue gas  Impaired glucose tolerance 08/07/21 A1C--5.7 Lifestyle modification  Hypomagnesemia repleted  Obesity, Class III, BMI 40-49.9 (morbid obesity) (HCC) BMI 48.35 Lifestyle modification  PAF (paroxysmal atrial fibrillation) (HCC) Currently in sinus rhythm Previously on apixaban, but patient stopped in October 2022 secondary nosebleeds Continue aspirin 81 mg daily Echocardiogram done on 09/27/2019 showed LVEF of 55 to 60%. LV has no RWMA.  LV diastolic parameters were normal.  Insomnia Continue Ambien per home regimen  Psoriatic arthritis (HCC) Holding methotrexate and Rinvoq due to current infection  Hypokalemia Repleted  GERD (gastroesophageal reflux disease) Continue Protonix  Asthma, chronic Stable on RA Continue Dulera and Singulair  Hyperlipemia- (present on admission) Continue Lipitor  OSA on CPAP Continue CPAP  Hypertension- (present on admission) Continue lisinopril    Status is: Inpatient Remains inpatient appropriate because: failure on oral abx, now requiring IV antibiotics           Family Communication:  spouse updated at bedside 2/27   Consultants:  none   Code Status:  FULL   DVT Prophylaxis:  [place] Lovenox     Procedures: As Listed in Progress Note Above   Antibiotics: Vanco 2/24>>           Subjective: Patient denies fevers, chills, headache, chest pain, dyspnea, nausea, vomiting, diarrhea, abdominal pain, dysuria, hematuria, hematochezia, and melena. Overall, he states  pain in leg is improving.  Objective: Vitals:   08/08/21 2228 08/09/21 0609 08/09/21 0818 08/09/21 1316  BP:  134/77  (!) 156/80  Pulse:  69  69  Resp:    20  Temp:  98.1 F (36.7 C)  98.8 F (37.1 C)  TempSrc:  Oral  Oral  SpO2: 97% 97%  96% 97%  Weight:      Height:        Intake/Output Summary (Last 24 hours) at 08/09/2021 1659 Last data filed at 08/09/2021 1300 Gross per 24 hour  Intake 1560 ml  Output 600 ml  Net 960 ml   Weight change:  Exam:  General:  Pt is alert, follows commands appropriately, not in acute distress HEENT: No icterus, No thrush, No neck mass, [place]/AT Cardiovascular: RRR, S1/S2, no rubs, no gallops Respiratory: bibasilar crackles.  Now heeze Abdomen: Soft/+BS, non tender, non distended, no guarding Extremities: see pics of right leg--areas of blanchable and nonblanchable erythema.  No necrosis.           Data Reviewed: I have personally reviewed following labs and imaging studies Basic Metabolic Panel: Recent Labs  Lab 08/06/21 2018 08/07/21 0526 08/08/21 0553 08/09/21 0555  NA 141 140 139 140  K 3.0* 3.3* 3.8 3.7  CL 109 109 107 108  CO2 25 25 26 24   GLUCOSE 150* 107* 132* 128*  BUN 16 15 9 9   CREATININE 1.03 0.90 0.92 0.89  CALCIUM 8.5* 8.2* 8.3* 8.1*  MG  --  1.2* 1.7 1.9  PHOS  --  3.1  --   --    Liver Function Tests: Recent Labs  Lab 08/06/21 2018 08/07/21 0526  AST 34 25  ALT 48* 38  ALKPHOS 65 55  BILITOT 1.4* 1.1  PROT 6.3* 5.4*  ALBUMIN 3.4* 2.9*   No results for input(s): LIPASE, AMYLASE in the last 168 hours. No results for input(s): AMMONIA in the last 168 hours. Coagulation Profile: No results for input(s): INR, PROTIME in the last 168 hours. CBC: Recent Labs  Lab 08/06/21 2018 08/07/21 0526 08/08/21 0553 08/09/21 0555  WBC 24.3* 20.6* 19.8* 16.0*  NEUTROABS 22.1*  --   --   --   HGB 13.7 12.2* 11.6* 10.9*  HCT 41.4 36.8* 35.2* 34.5*  MCV 91.0 92.0 92.4 91.8  PLT 316 272 258 251   Cardiac Enzymes: No results for input(s): CKTOTAL, CKMB, CKMBINDEX, TROPONINI in the last 168 hours. BNP: Invalid input(s): POCBNP CBG: No results for input(s): GLUCAP in the last 168 hours. HbA1C: Recent Labs    08/07/21 0527 08/08/21 0553  HGBA1C  5.7* 5.7*   Urine analysis:    Component Value Date/Time   COLORURINE YELLOW 12/11/2020 1826   APPEARANCEUR HAZY (A) 12/11/2020 1826   LABSPEC 1.017 12/11/2020 1826   PHURINE 5.0 12/11/2020 1826   GLUCOSEU NEGATIVE 12/11/2020 1826   HGBUR NEGATIVE 12/11/2020 1826   BILIRUBINUR NEGATIVE 12/11/2020 1826   KETONESUR 20 (A) 12/11/2020 1826   PROTEINUR 30 (A) 12/11/2020 1826   UROBILINOGEN 0.2 03/14/2014 0100   NITRITE NEGATIVE 12/11/2020 1826   LEUKOCYTESUR NEGATIVE 12/11/2020 1826   Sepsis Labs: @LABRCNTIP (procalcitonin:4,lacticidven:4) ) Recent Results (from the past 240 hour(s))  Resp Panel by RT-PCR (Flu A&B, Covid) Nasopharyngeal Swab     Status: Abnormal   Collection Time: 08/07/21 12:28 AM   Specimen: Nasopharyngeal Swab; Nasopharyngeal(NP) swabs in vial transport medium  Result Value Ref Range Status   SARS Coronavirus 2 by RT PCR POSITIVE (A) NEGATIVE Final  Comment: (NOTE) SARS-CoV-2 target nucleic acids are DETECTED.  The SARS-CoV-2 RNA is generally detectable in upper respiratory specimens during the acute phase of infection. Positive results are indicative of the presence of the identified virus, but do not rule out bacterial infection or co-infection with other pathogens not detected by the test. Clinical correlation with patient history and other diagnostic information is necessary to determine patient infection status. The expected result is Negative.  Fact Sheet for Patients: EntrepreneurPulse.com.au  Fact Sheet for Healthcare Providers: IncredibleEmployment.be  This test is not yet approved or cleared by the [place] FDA and  has been authorized for detection and/or diagnosis of SARS-CoV-2 by FDA under an Emergency Use Authorization (EUA).  This EUA will remain in effect (meaning this test can be used) for the duration of  the COVID-19 declaration under Section 564(b)(1) of the A ct, 21 U.S.C. section  360bbb-3(b)(1), unless the authorization is terminated or revoked sooner.     Influenza A by PCR NEGATIVE NEGATIVE Final   Influenza B by PCR NEGATIVE NEGATIVE Final    Comment: (NOTE) The Xpert Xpress SARS-CoV-2/FLU/RSV plus assay is intended as an aid in the diagnosis of influenza from Nasopharyngeal swab specimens and should not be used as a sole basis for treatment. Nasal washings and aspirates are unacceptable for Xpert Xpress SARS-CoV-2/FLU/RSV testing.  Fact Sheet for Patients: EntrepreneurPulse.com.au  Fact Sheet for Healthcare Providers: IncredibleEmployment.be  This test is not yet approved or cleared by the [place] FDA and has been authorized for detection and/or diagnosis of SARS-CoV-2 by FDA under an Emergency Use Authorization (EUA). This EUA will remain in effect (meaning this test can be used) for the duration of the COVID-19 declaration under Section 564(b)(1) of the Act, 21 U.S.C. section 360bbb-3(b)(1), unless the authorization is terminated or revoked.  Performed at [facility], [address], [place], [place] [address]   MRSA Next Gen by PCR, Nasal     Status: Abnormal   Collection Time: 08/07/21  2:33 AM   Specimen: Nasal Mucosa; Nasal Swab  Result Value Ref Range Status   MRSA by PCR Next Gen DETECTED (A) NOT DETECTED Final    Comment: RESULT CALLED TO, READ BACK BY AND VERIFIED WITH: RACHEL @ 1228 ON [id number] BY HENDERSON L (NOTE) The GeneXpert MRSA Assay (FDA approved for NASAL specimens only), is one component of a comprehensive MRSA colonization surveillance program. It is not intended to diagnose MRSA infection nor to guide or monitor treatment for MRSA infections. Test performance is not FDA approved in patients less than 49 years old. Performed at [facility], [address] [place], [place] [address]      Scheduled Meds:  atorvastatin  80 mg Oral q1800   enoxaparin (LOVENOX) injection  70 mg  Subcutaneous Q24H   feeding supplement (GLUCERNA SHAKE)  237 mL Oral TID BM   folic acid  1 mg Oral Daily   lisinopril  20 mg Oral Daily   mometasone-formoterol  2 puff Inhalation BID   montelukast  10 mg Oral QHS   mupirocin ointment   Nasal BID   pantoprazole  40 mg Oral Daily   silver sulfADIAZINE   Topical BID   Continuous Infusions:  vancomycin 1,250 mg (08/09/21 1332)    Procedures/Studies: DG Tibia/Fibula Right  Result Date: 08/06/2021 CLINICAL DATA:  Pain and swelling EXAM: RIGHT TIBIA AND FIBULA - 2 VIEW COMPARISON:  None. FINDINGS: Report no fracture or malalignment. Considerable soft tissue swelling without emphysema. Rectangular shaped opacity at the  distal lower leg on the lateral side. Wound or ulcer anteriorly at the distal lower leg. No periostitis or osseous destructive change IMPRESSION: Considerable soft tissue swelling without acute osseous abnormality. Wound or ulcer anteriorly at the distal lower leg. Rectangular shaped opacity at the distal lower leg on the lateral side, frontal view, question external artifact, correlate with direct inspection Electronically Signed   By: Donavan Foil M.D.   On: 08/06/2021 20:49   CT TIBIA FIBULA RIGHT W CONTRAST  Result Date: 08/08/2021 CLINICAL DATA:  Soft tissue infection suspected, lower leg, xray done EXAM: CT OF THE LOWER RIGHT EXTREMITY WITH CONTRAST TECHNIQUE: Multidetector CT imaging of the lower right extremity was performed according to the standard protocol following intravenous contrast administration. RADIATION DOSE REDUCTION: This exam was performed according to the departmental dose-optimization program which includes automated exposure control, adjustment of the mA and/or kV according to patient size and/or use of iterative reconstruction technique. CONTRAST:  145mL OMNIPAQUE IOHEXOL 300 MG/ML  SOLN COMPARISON:  X-ray 08/06/2021 FINDINGS: Bones/Joint/Cartilage No acute fracture. No dislocation. No bony erosion or  periosteal elevation. Moderate-severe tricompartmental osteoarthritis of the right knee with bulky marginal osteophytes and advanced joint space loss. Moderate tibiotalar joint osteoarthritis. Ligaments Suboptimally assessed by CT. Muscles and Tendons No acute musculotendinous abnormality by CT. Suspected tendinosis of the inframalleolar peroneal tendons. No appreciable tenosynovial fluid collection. No intramuscular fluid collection. Soft tissues Circumferential subcutaneous edema and ill-defined fluid throughout the lower leg. No organized or rim enhancing fluid collection. No soft tissue gas. Scattered nonspecific subcutaneous calcifications. IMPRESSION: 1. Circumferential subcutaneous edema and ill-defined fluid throughout the lower leg, which may represent cellulitis in the appropriate clinical setting. No organized or rim-enhancing fluid collection. No soft tissue gas. 2. 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 3. Suspected tendinosis of the inframalleolar peroneal tendons. Electronically Signed   By: Davina Poke D.O.   On: 08/08/2021 13:34   US Venous Img Lower Unilateral Right (DVT)  Result Date: 08/08/2021 CLINICAL DATA:  RIGHT open calf wound with redness x2 weeks. EXAM: RIGHT LOWER EXTREMITY VENOUS DOPPLER ULTRASOUND TECHNIQUE: Gray-scale sonography with compression, as well as color and duplex ultrasound, were performed to evaluate the deep venous system(s) from the level of the common femoral vein through the popliteal and proximal calf veins. COMPARISON:  RIGHT lower extremity XRs, 08/06/2021. FINDINGS: Suboptimal evaluation, secondary to distal RIGHT lower extremity bandages limiting the assessment. VENOUS Normal compressibility of the RIGHT common femoral, superficial femoral, and popliteal veins, as well as the portions of the visualized calf veins. Visualized portions of profunda femoral vein and great saphenous vein unremarkable. No filling defects to suggest DVT on grayscale  or color Doppler imaging. Doppler waveforms show normal direction of venous flow, normal respiratory plasticity and response to augmentation. Limited views of the contralateral common femoral vein are unremarkable. OTHER No evidence of superficial thrombophlebitis or abnormal fluid collection. Distal RIGHT lower extremity edema. Limitations: none IMPRESSION: Suboptimal evaluation, within these constraints; No evidence of femoropopliteal DVT within the RIGHT lower extremity. Michaelle Birks, MD [facility] Radiology Electronically Signed   By: Michaelle Birks M.D.   On: 08/08/2021 11:26    Orson Eva, DO  [facility]  If 7PM-7AM, please contact night-coverage [email or web address] Password [email or web address] 08/09/2021, 4:59 PM   LOS: 3 days

## 2021-08-09 NOTE — Discharge Summary (Signed)
Physician Discharge Summary   Patient: [name] [name] MRN: [id number] DOB: 1961/02/03  Admit date:     08/06/2021  Discharge date: 08/10/21  Discharge Physician: Shanon Brow Itxel Wickard   PCP: Andres Shad, MD   Recommendations at discharge:   Please follow up with primary care provider within 1-2 weeks  Please repeat BMP and CBC in one week     Hospital Course: 61 year old male with a history of hypertension, diabetes mellitus, paroxysmal atrial fibrillation, asthma, OSA, hyperlipidemia presenting with 1 day history of increasing pain, erythema, and edema about his right leg.  The patient states that he was hit by accident with a piece of metal at work 17 days prior to this admission.  He stated that he had sutures placed on the same day at work.  He states that he had the sutures in place for 14 days.  During the 14-day period, the patient did have some mild edema and clear yellow drainage from her nose right lower extremity.  However, he did not note any erythema or purulence at that time.  He was also prescribed a 10-day course of cephalexin which she finished on 08/01/2021.  His stitches were removed on 08/04/2021 during which time he he noticed that his wound " opened up again" on his right pretibial area.  He noted that there was continued clear yellow fluid drainage.  After the stitches were removed, his workplace placed some type of silver embedded dressing into the wound on a daily basis.  On 08/06/2021 he noted increasing pain, erythema, and edema about his right leg.  As result, he came to the emergency department for further evaluation.  He denies any fevers, chills, headache, chest pain, short of breath, coughing, hemoptysis, nausea, vomiting, diarrhea, abdominal pain. In the ED, the patient had low-grade temperature 99.0 F.  He was hemodynamically stable with oxygen saturation 97-98% on room air.  WBC 24.3, hemoglobin 13.7, platelets 272,000.  BMP showed sodium 141, potassium 3.0, serum  creatinine 1.03.  X-ray of the right tib/fib area showed soft tissue edema without any osseous abnormality.  Patient was started intravenous vancomycin.  Lactic acid was 1.6.  Assessment and Plan: * Cellulitis of right lower leg- (present on admission) Continued vancomycin during the hospitalization WBC 24.3>>>13.0 Wound care consult appreciated Conitnue norco prn pain 2/26 CT right tib/fib--No organized or rim-enhancing fluid collection. No soft tissue gas D/c home with doxycycline and cephalexin x 6 more days Outpatient referral to wound care clinic in [place] below on day of d/c Continued gradual improvement daily   Impaired glucose tolerance 08/07/21 A1C--5.7 Lifestyle modification  Hypomagnesemia repleted  Obesity, Class III, BMI 40-49.9 (morbid obesity) (HCC) BMI 48.35 Lifestyle modification  PAF (paroxysmal atrial fibrillation) (HCC) Currently in sinus rhythm Previously on apixaban, but patient stopped in October 2022 secondary nosebleeds Continue aspirin 81 mg daily Echocardiogram done on 09/27/2019 showed LVEF of 55 to 60%. LV has no RWMA.  LV diastolic parameters were normal.  Insomnia Continue Ambien per home regimen  Psoriatic arthritis (HCC) Holding methotrexate and Rinvoq due to current infection  Hypokalemia Repleted  GERD (gastroesophageal reflux disease) Continue Protonix  Asthma, chronic Stable on RA Continue Dulera and Singulair  Hyperlipemia- (present on admission) Continue Lipitor  OSA on CPAP Continue CPAP  Hypertension- (present on admission) Continue lisinopril            Pain control - [place] Controlled Substance Reporting System database was reviewed. and patient was instructed, not to drive, operate heavy machinery,  perform activities at heights, swimming or participation in water activities or provide baby-sitting services while on Pain, Sleep and Anxiety Medications; until their outpatient Physician has  advised to do so again. Also recommended to not to take more than prescribed Pain, Sleep and Anxiety Medications.   Consultants: none Procedures performed: none  Disposition: Home Diet recommendation:  Cardiac diet  DISCHARGE MEDICATION: Allergies as of 08/10/2021   No Known Allergies      Medication List     STOP taking these medications    HYDROcodone-acetaminophen 10-325 MG tablet Commonly known as: NORCO Replaced by: HYDROcodone-acetaminophen 5-325 MG tablet   traMADol 50 MG tablet Commonly known as: ULTRAM       TAKE these medications    atorvastatin 80 MG tablet Commonly known as: LIPITOR Take 80 mg by mouth daily.   cephALEXin 500 MG capsule Commonly known as: KEFLEX Take 1 capsule (500 mg total) by mouth every 6 (six) hours.   doxycycline 100 MG tablet Commonly known as: VIBRA-TABS Take 1 tablet (100 mg total) by mouth every 12 (twelve) hours.   Fluticasone-Salmeterol 250-50 MCG/DOSE Aepb Commonly known as: ADVAIR Inhale 1 puff into the lungs 2 (two) times daily.   folic acid 1 MG tablet Commonly known as: FOLVITE Take 1 mg by mouth daily.   HYDROcodone-acetaminophen 5-325 MG tablet Commonly known as: NORCO/VICODIN Take 1 tablet by mouth every 4 (four) hours as needed for moderate pain. Replaces: HYDROcodone-acetaminophen 10-325 MG tablet   lisinopril 20 MG tablet Commonly known as: ZESTRIL Take 20 mg by mouth daily.   methotrexate 2.5 MG tablet Commonly known as: RHEUMATREX Take 20 mg by mouth once a week.   montelukast 10 MG tablet Commonly known as: SINGULAIR Take 10 mg by mouth at bedtime.   omeprazole 40 MG capsule Commonly known as: PRILOSEC Take 1 capsule (40 mg total) by mouth daily. What changed: how much to take   zolpidem 10 MG tablet Commonly known as: AMBIEN Take 10 mg by mouth at bedtime as needed for sleep.         Discharge Exam: Filed Weights   08/06/21 1959 08/07/21 0109  Weight: (!) 146.6 kg (!) 148.5 kg    HEENT:  [place]/AT, No thrush, no icterus CV:  RRR, no rub, no S3, no S4 Lung:  fine bibasilar rales. No wheeze Abd:  soft/+BS, NT Ext:  right leg with blanchable and nonblanchable erythema. No necrosis.  1+edema.  No pus   Condition at discharge: stable  The results of significant diagnostics from this hospitalization (including imaging, microbiology, ancillary and laboratory) are listed below for reference.   Imaging Studies: DG Tibia/Fibula Right  Result Date: 08/06/2021 CLINICAL DATA:  Pain and swelling EXAM: RIGHT TIBIA AND FIBULA - 2 VIEW COMPARISON:  None. FINDINGS: Report no fracture or malalignment. Considerable soft tissue swelling without emphysema. Rectangular shaped opacity at the distal lower leg on the lateral side. Wound or ulcer anteriorly at the distal lower leg. No periostitis or osseous destructive change IMPRESSION: Considerable soft tissue swelling without acute osseous abnormality. Wound or ulcer anteriorly at the distal lower leg. Rectangular shaped opacity at the distal lower leg on the lateral side, frontal view, question external artifact, correlate with direct inspection Electronically Signed   By: Donavan Foil M.D.   On: 08/06/2021 20:49   CT TIBIA FIBULA RIGHT W CONTRAST  Result Date: 08/08/2021 CLINICAL DATA:  Soft tissue infection suspected, lower leg, xray done EXAM: CT OF THE LOWER RIGHT EXTREMITY WITH CONTRAST TECHNIQUE: Multidetector  CT imaging of the lower right extremity was performed according to the standard protocol following intravenous contrast administration. RADIATION DOSE REDUCTION: This exam was performed according to the departmental dose-optimization program which includes automated exposure control, adjustment of the mA and/or kV according to patient size and/or use of iterative reconstruction technique. CONTRAST:  124mL OMNIPAQUE IOHEXOL 300 MG/ML  SOLN COMPARISON:  X-ray 08/06/2021 FINDINGS: Bones/Joint/Cartilage No acute fracture. No dislocation.  No bony erosion or periosteal elevation. Moderate-severe tricompartmental osteoarthritis of the right knee with bulky marginal osteophytes and advanced joint space loss. Moderate tibiotalar joint osteoarthritis. Ligaments Suboptimally assessed by CT. Muscles and Tendons No acute musculotendinous abnormality by CT. Suspected tendinosis of the inframalleolar peroneal tendons. No appreciable tenosynovial fluid collection. No intramuscular fluid collection. Soft tissues Circumferential subcutaneous edema and ill-defined fluid throughout the lower leg. No organized or rim enhancing fluid collection. No soft tissue gas. Scattered nonspecific subcutaneous calcifications. IMPRESSION: 1. Circumferential subcutaneous edema and ill-defined fluid throughout the lower leg, which may represent cellulitis in the appropriate clinical setting. No organized or rim-enhancing fluid collection. No soft tissue gas. 2. 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 3. Suspected tendinosis of the inframalleolar peroneal tendons. Electronically Signed   By: Davina Poke D.O.   On: 08/08/2021 13:34   US Venous Img Lower Unilateral Right (DVT)  Result Date: 08/08/2021 CLINICAL DATA:  RIGHT open calf wound with redness x2 weeks. EXAM: RIGHT LOWER EXTREMITY VENOUS DOPPLER ULTRASOUND TECHNIQUE: Gray-scale sonography with compression, as well as color and duplex ultrasound, were performed to evaluate the deep venous system(s) from the level of the common femoral vein through the popliteal and proximal calf veins. COMPARISON:  RIGHT lower extremity XRs, 08/06/2021. FINDINGS: Suboptimal evaluation, secondary to distal RIGHT lower extremity bandages limiting the assessment. VENOUS Normal compressibility of the RIGHT common femoral, superficial femoral, and popliteal veins, as well as the portions of the visualized calf veins. Visualized portions of profunda femoral vein and great saphenous vein unremarkable. No filling defects to  suggest DVT on grayscale or color Doppler imaging. Doppler waveforms show normal direction of venous flow, normal respiratory plasticity and response to augmentation. Limited views of the contralateral common femoral vein are unremarkable. OTHER No evidence of superficial thrombophlebitis or abnormal fluid collection. Distal RIGHT lower extremity edema. Limitations: none IMPRESSION: Suboptimal evaluation, within these constraints; No evidence of femoropopliteal DVT within the RIGHT lower extremity. Michaelle Birks, MD [facility] Electronically Signed   By: Michaelle Birks M.D.   On: 08/08/2021 11:26    Microbiology: Results for orders placed or performed during the hospital encounter of 08/06/21  Resp Panel by RT-PCR (Flu A&B, Covid) Nasopharyngeal Swab     Status: Abnormal   Collection Time: 08/07/21 12:28 AM   Specimen: Nasopharyngeal Swab; Nasopharyngeal(NP) swabs in vial transport medium  Result Value Ref Range Status   SARS Coronavirus 2 by RT PCR POSITIVE (A) NEGATIVE Final    Comment: (NOTE) SARS-CoV-2 target nucleic acids are DETECTED.  The SARS-CoV-2 RNA is generally detectable in upper respiratory specimens during the acute phase of infection. Positive results are indicative of the presence of the identified virus, but do not rule out bacterial infection or co-infection with other pathogens not detected by the test. Clinical correlation with patient history and other diagnostic information is necessary to determine patient infection status. The expected result is Negative.  Fact Sheet for Patients: EntrepreneurPulse.com.au  Fact Sheet for Healthcare Providers: IncredibleEmployment.be  This test is not yet approved or cleared  by the [place] and  has been authorized for detection and/or diagnosis of SARS-CoV-2 by FDA under an Emergency Use Authorization (EUA).  This EUA will remain  in effect (meaning this test can be used) for the duration of  the COVID-19 declaration under Section 564(b)(1) of the A ct, 21 U.S.C. section 360bbb-3(b)(1), unless the authorization is terminated or revoked sooner.     Influenza A by PCR NEGATIVE NEGATIVE Final   Influenza B by PCR NEGATIVE NEGATIVE Final    Comment: (NOTE) The Xpert Xpress SARS-CoV-2/FLU/RSV plus assay is intended as an aid in the diagnosis of influenza from Nasopharyngeal swab specimens and should not be used as a sole basis for treatment. Nasal washings and aspirates are unacceptable for Xpert Xpress SARS-CoV-2/FLU/RSV testing.  Fact Sheet for Patients: EntrepreneurPulse.com.au  Fact Sheet for Healthcare Providers: IncredibleEmployment.be  This test is not yet approved or cleared by the [place] FDA and has been authorized for detection and/or diagnosis of SARS-CoV-2 by FDA under an Emergency Use Authorization (EUA). This EUA will remain in effect (meaning this test can be used) for the duration of the COVID-19 declaration under Section 564(b)(1) of the Act, 21 U.S.C. section 360bbb-3(b)(1), unless the authorization is terminated or revoked.  Performed at [facility], [address] [place], [place] [address]   MRSA Next Gen by PCR, Nasal     Status: Abnormal   Collection Time: 08/07/21  2:33 AM   Specimen: Nasal Mucosa; Nasal Swab  Result Value Ref Range Status   MRSA by PCR Next Gen DETECTED (A) NOT DETECTED Final    Comment: RESULT CALLED TO, READ BACK BY AND VERIFIED WITH: RACHEL @ 1228 ON [id number] BY HENDERSON L (NOTE) The GeneXpert MRSA Assay (FDA approved for NASAL specimens only), is one component of a comprehensive MRSA colonization surveillance program. It is not intended to diagnose MRSA infection nor to guide or monitor treatment for MRSA infections. Test performance is not FDA approved in patients less than 65 years old. Performed at [facility], [address], [place], [place] [address]     Labs: CBC: Recent Labs  Lab 08/06/21 2018 08/07/21 0526 08/08/21 0553 08/09/21 0555 08/10/21 0536  WBC 24.3* 20.6* 19.8* 16.0* 13.0*  NEUTROABS 22.1*  --   --   --   --   HGB 13.7 12.2* 11.6* 10.9* 11.0*  HCT 41.4 36.8* 35.2* 34.5* 34.4*  MCV 91.0 92.0 92.4 91.8 93.0  PLT 316 272 258 251 [id number]   Basic Metabolic Panel: Recent Labs  Lab 08/06/21 2018 08/07/21 0526 08/08/21 0553 08/09/21 0555  NA 141 140 139 140  K 3.0* 3.3* 3.8 3.7  CL 109 109 107 108  CO2 25 25 26 24   GLUCOSE 150* 107* 132* 128*  BUN 16 15 9 9   CREATININE 1.03 0.90 0.92 0.89  CALCIUM 8.5* 8.2* 8.3* 8.1*  MG  --  1.2* 1.7 1.9  PHOS  --  3.1  --   --    Liver Function Tests: Recent Labs  Lab 08/06/21 2018 08/07/21 0526  AST 34 25  ALT 48* 38  ALKPHOS 65 55  BILITOT 1.4* 1.1  PROT 6.3* 5.4*  ALBUMIN 3.4* 2.9*   CBG: No results for input(s): GLUCAP in the last 168 hours.  Discharge time spent: greater than 30 minutes.  Signed: Orson Eva, MD [facility] 08/10/2021

## 2021-08-10 DIAGNOSIS — R7302 Impaired glucose tolerance (oral): Secondary | ICD-10-CM

## 2021-08-10 LAB — CBC
HCT: 34.4 % — ABNORMAL LOW (ref 39.0–52.0)
Hemoglobin: 11 g/dL — ABNORMAL LOW (ref 13.0–17.0)
MCH: 29.7 pg (ref 26.0–34.0)
MCHC: 32 g/dL (ref 30.0–36.0)
MCV: 93 fL (ref 80.0–100.0)
Platelets: 298 10*3/uL (ref 150–400)
RBC: 3.7 MIL/uL — ABNORMAL LOW (ref 4.22–5.81)
RDW: 19.1 % — ABNORMAL HIGH (ref 11.5–15.5)
WBC: 13 10*3/uL — ABNORMAL HIGH (ref 4.0–10.5)
nRBC: 0 % (ref 0.0–0.2)

## 2021-08-10 MED ORDER — DOXYCYCLINE HYCLATE 100 MG PO TABS
100.0000 mg | ORAL_TABLET | Freq: Two times a day (BID) | ORAL | 0 refills | Status: DC
Start: 2021-08-10 — End: 2022-03-15

## 2021-08-10 MED ORDER — DOXYCYCLINE HYCLATE 100 MG PO TABS: 100.0000 mg | ORAL_TABLET | Freq: Two times a day (BID) | ORAL | Status: DC

## 2021-08-10 MED ORDER — CEPHALEXIN 500 MG PO CAPS: 500.0000 mg | ORAL_CAPSULE | Freq: Four times a day (QID) | ORAL | Status: DC

## 2021-08-10 MED ORDER — CEPHALEXIN 500 MG PO CAPS
500.0000 mg | ORAL_CAPSULE | Freq: Four times a day (QID) | ORAL | 0 refills | Status: DC
Start: 2021-08-10 — End: 2022-03-15

## 2021-08-10 MED ORDER — HYDROCODONE-ACETAMINOPHEN 5-325 MG PO TABS
1.0000 | ORAL_TABLET | ORAL | 0 refills | Status: DC | PRN
Start: 2021-08-10 — End: 2022-03-15

## 2021-08-17 ENCOUNTER — Ambulatory Visit (HOSPITAL_COMMUNITY): Payer: No Typology Code available for payment source | Attending: Internal Medicine | Admitting: Physical Therapy

## 2021-08-17 ENCOUNTER — Other Ambulatory Visit: Payer: Self-pay

## 2021-08-17 ENCOUNTER — Encounter (HOSPITAL_COMMUNITY): Payer: Self-pay | Admitting: Physical Therapy

## 2021-08-17 DIAGNOSIS — S81801A Unspecified open wound, right lower leg, initial encounter: Secondary | ICD-10-CM | POA: Insufficient documentation

## 2021-08-17 DIAGNOSIS — L03115 Cellulitis of right lower limb: Secondary | ICD-10-CM | POA: Diagnosis not present

## 2021-08-17 DIAGNOSIS — R2689 Other abnormalities of gait and mobility: Secondary | ICD-10-CM | POA: Insufficient documentation

## 2021-08-17 NOTE — Therapy (Signed)
[facility] [address] [place], [place], [address] Phone: [phone]   Fax:  [phone]  Wound Care Evaluation  Patient Details  Name: [name] [name] MRN: [id number] Date of Birth: [birth year] Referring Provider (PT): Catarina Hartshorn MD   Encounter Date: 08/17/2021   PT End of Session - 08/17/21 1503     Visit Number 1    Number of Visits 12    Date for PT Re-Evaluation 09/28/21    Authorization Type BCBS (no auth, 60 VL)    Authorization - Visit Number 1    Authorization - Number of Visits 60    PT Start Time 1430    PT Stop Time 1505    PT Time Calculation (min) 35 min    Activity Tolerance Patient tolerated treatment well    Behavior During Therapy [facility] for tasks assessed/performed             Past Medical History:  Diagnosis Date   Asthma    Atrial fibrillation [facility])    Documented March 2021   Essential hypertension    History of nephrolithiasis    Hyperlipemia    Obesity    BMI 42.6   Psoriasis    Psoriatic arthritis (HCC)    Seasonal allergies    Sleep apnea     Past Surgical History:  Procedure Laterality Date   NOSE SURGERY     VIDEO ASSISTED THORACOSCOPY (VATS)/DECORTICATION Right 03/20/2014   Procedure: VIDEO ASSISTED THORACOSCOPY (VATS)/DECORTICATION;  Surgeon: Kerin Perna, MD;  Location: [facility] OR;  Service: Thoracic;  Laterality: Right;    There were no vitals filed for this visit.     [facility] PT Assessment - 08/17/21 0001       Assessment   Medical Diagnosis Cellulitis of RLE/wound    Referring Provider (PT) Catarina Hartshorn MD             Wound Therapy - 08/17/21 0001     Subjective Patient is a 61 y.o. male who presents to physical therapy with referral for cellulitis of R lower leg with RLE wound. Patient was admitted on 08/06/21 due to the wound and cellulitis. Patient cut his leg requiring stiches but when stiches were removed, wound opened. Patient has been using Silvadene on wound as prescribed by MD  and had been on antibiotics.    Patient and Family Stated Goals wound to heal    Date of Onset 07/20/21    Prior Treatments antibiotics, silvadene    Pain Score 7     Pain Location Leg    Pain Orientation Right    Evaluation and Treatment Procedures Explained to Patient/Family Yes    Evaluation and Treatment Procedures agreed to    Wound Properties Date First Assessed: 08/17/21 Time First Assessed: 1430 Wound Type: Laceration Location: Pretibial Location Orientation: Right;Anterior Wound Description (Comments): R shin wound Present on Admission: Yes   Wound Image Images linked: 1    Dressing Type Compression wrap    Dressing Changed Changed    Dressing Status Old drainage    Dressing Change Frequency PRN    Site / Wound Assessment Yellow;Red;Pink;Painful    % Wound base Red or Granulating 10%    % Wound base Yellow/Fibrinous Exudate 90%    Wound Length (cm) 5.5 cm    Wound Width (cm) 4 cm    Wound Surface Area (cm^2) 22 cm^2    Margins Epibole (rolled edges)    Drainage Amount Minimal    Drainage Description  Sanguineous;Serosanguineous    Treatment Cleansed;Debridement (Selective)    Selective Debridement - Location wound bed and periwound    Selective Debridement - Tools Used Forceps;Scissors    Selective Debridement - Tissue Removed slough    Wound Therapy - Clinical Statement Patient is a 61 y.o. male who presents to physical therapy with referral for cellulitis of R lower leg with RLE wound. Patient was admitted on 08/06/21 due to the wound and cellulitis. Patient cut his leg requiring stiches but when stiches were removed, wound opened. Patient has been using Silvadene on wound as prescribed by MD and had been on antibiotics. Cleansed and debrided wound which mostly consist of slough which is tolerated fairly well with pain noted throughout. Wound appearing fairly dry with edema throughout RLE but greatest around wound bed. Dressed wound with medihoney, 4x4, hydrogel, profore lite.  Patient will benefit from skilled physical therapy in order to promote wound healing.    Wound Therapy - Functional Problem List walking, standing    Factors Delaying/Impairing Wound Healing Diabetes Mellitus;Multiple medical problems    Hydrotherapy Plan Debridement;Dressing change;Electrical stimulation;Patient/family education;Pulsatile lavage with suction    Wound Therapy - Frequency 2X / week    Wound Therapy - Current Recommendations PT    Wound Plan debride and dressing changes    Dressing  medihoney, 4x4, hydrogel, profore lite, netting                   Objective measurements completed on examination: See above findings.             PT Education - 08/17/21 1502     Education Details Patient educated on exam findings, dressings    Person(s) Educated Patient    Methods Explanation    Comprehension Verbalized understanding              PT Short Term Goals - 08/17/21 1507       PT SHORT TERM GOAL #1   Title Patients wound will be free from slough.    Time 3    Period Weeks    Status New    Target Date 09/07/21               PT Long Term Goals - 08/17/21 1508       PT LONG TERM GOAL #1   Title Patient wound to be healed to reduce risk of infection.    Time 6    Period Weeks    Status New    Target Date 09/28/21      PT LONG TERM GOAL #2   Title Patient will be able to return to all activities unrestricted for improved ability to perform work functions and participate with family.    Time 6    Period Weeks    Status New    Target Date 09/28/21                  Plan - 08/17/21 1504     Clinical Impression Statement see above    Personal Factors and Comorbidities Fitness;Comorbidity 3+    Comorbidities HTN, HLD, a fib, psoriatic arthritis, DM    Examination-Activity Limitations Locomotion Level;Transfers;Stand;Stairs;Squat    Examination-Participation Restrictions Occupation;Cleaning;Community Activity;Shop;Volunteer;Yard  Work    Conservation officer, historic buildings Evolving/Moderate complexity    Clinical Decision Making Moderate    Rehab Potential Good    PT Frequency 2x / week    PT Duration 6 weeks    PT Treatment/Interventions ADLs/Self Care Home Management;Gait training;Stair training;Functional  mobility training;Manual techniques;Therapeutic activities;Therapeutic exercise;Patient/family education;Orthotic Fit/Training;Compression bandaging;Manual lymph drainage    PT Next Visit Plan debride and dressing changes to promote wound healing    Consulted and Agree with Plan of Care Patient;Family member/caregiver    Family Member Consulted wife             Patient will benefit from skilled therapeutic intervention in order to improve the following deficits and impairments:  Abnormal gait, Difficulty walking, Pain, Decreased activity tolerance, Increased edema, Decreased skin integrity  Visit Diagnosis: Other abnormalities of gait and mobility  Wound of right lower extremity, initial encounter    Problem List Patient Active Problem List   Diagnosis Date Noted   Impaired glucose tolerance 08/08/2021   GERD (gastroesophageal reflux disease) 08/07/2021   Hypokalemia 08/07/2021   Psoriatic arthritis (HCC) 08/07/2021   Insomnia 08/07/2021   PAF (paroxysmal atrial fibrillation) (HCC) 08/07/2021   Obesity, Class III, BMI 40-49.9 (morbid obesity) (HCC) 08/07/2021   Hypomagnesemia 08/07/2021   Post-traumatic wound infection    Cellulitis of right lower leg 08/06/2021   Asthma, chronic 08/06/2021   Type 2 diabetes mellitus (HCC) 08/18/2019   Coagulopathy (HCC) 08/18/2019   Long term current use of anticoagulant therapy 08/18/2019   Atrial fibrillation with RVR (HCC) 08/17/2019   COVID-19    Hypoxia    Immune deficiency disorder (HCC)    Empyema (HCC) 03/20/2014   Empyema of right pleural space (HCC) 03/18/2014   CAP (community acquired pneumonia) 03/14/2014   Hydronephrosis, left 03/14/2014    Back pain 03/14/2014   Hypertension    OSA on CPAP    Hyperlipemia     3:20 PM, 08/17/21 Wyman Songster PT, DPT Physical Therapist at [facility]   [facility] [address] [place], [place], [address] Phone: [phone]   Fax:  [phone]  Name: [name] [name] MRN: [id number] Date of Birth: 10-Jun-1961

## 2021-08-19 ENCOUNTER — Encounter (HOSPITAL_COMMUNITY): Payer: Self-pay

## 2021-08-19 ENCOUNTER — Other Ambulatory Visit: Payer: Self-pay

## 2021-08-19 ENCOUNTER — Ambulatory Visit (HOSPITAL_COMMUNITY): Payer: No Typology Code available for payment source

## 2021-08-19 DIAGNOSIS — R2689 Other abnormalities of gait and mobility: Secondary | ICD-10-CM | POA: Diagnosis not present

## 2021-08-19 DIAGNOSIS — S81801A Unspecified open wound, right lower leg, initial encounter: Secondary | ICD-10-CM

## 2021-08-19 NOTE — Therapy (Signed)
[place] ?[place] ?[address] ?[place], [place], [address] ?Phone: [phone]   Fax:  [phone] ? ?Wound Care Therapy ? ?Patient Details  ?Name: [name] [name] ?MRN: [id number] ?Date of Birth: [birth year] ?Referring Provider (PT): Orson Eva MD ? ? ?Encounter Date: 08/19/2021 ? ? PT End of Session - 08/19/21 1535   ? ? Visit Number 2   ? Number of Visits 12   ? Date for PT Re-Evaluation 09/28/21   ? Authorization Type BCBS (no auth, 60 VL)   ? Authorization - Visit Number 2   ? Authorization - Number of Visits 60   ? PT Start Time 1450   ? PT Stop Time 1530   ? PT Time Calculation (min) 40 min   ? Activity Tolerance Patient tolerated treatment well;No increased pain   ? Behavior During Therapy [facility] for tasks assessed/performed   ? ?  ?  ? ?  ? ? ?Past Medical History:  ?Diagnosis Date  ? Asthma   ? Atrial fibrillation ([place])   ? Documented March 2021  ? Essential hypertension   ? History of nephrolithiasis   ? Hyperlipemia   ? Obesity   ? BMI 42.6  ? Psoriasis   ? Psoriatic arthritis ([place])   ? Seasonal allergies   ? Sleep apnea   ? ? ?Past Surgical History:  ?Procedure Laterality Date  ? NOSE SURGERY    ? VIDEO ASSISTED THORACOSCOPY (VATS)/DECORTICATION Right 03/20/2014  ? Procedure: VIDEO ASSISTED THORACOSCOPY (VATS)/DECORTICATION;  Surgeon: Ivin Poot, MD;  Location: [facility];  Service: Thoracic;  Laterality: Right;  ? ? ?There were no vitals filed for this visit. ? ? ? Subjective Assessment - 08/19/21 1534   ? ? Subjective Pt arrived wiht dressings intact, no reports of pain today.   ? Currently in Pain? No/denies   ? ?  ?  ? ?  ? ? ? ? ? ? ? ? ? ? ? ? Wound Therapy - 08/19/21 0001   ? ? Subjective Pt arrived wiht dressings intact, no reports of pain today.   ? Patient and Family Stated Goals wound to heal   ? Date of Onset 07/20/21   ? Prior Treatments antibiotics, silvadene   ? Pain Scale 0-10   ? Pain Score 0-No pain   ? Evaluation and Treatment Procedures Explained to  Patient/Family Yes   ? Evaluation and Treatment Procedures agreed to   ? Wound Properties Date First Assessed: 08/17/21 Time First Assessed: 1430 Wound Type: Laceration Location: Pretibial Location Orientation: Right;Anterior Wound Description (Comments): R shin wound Present on Admission: Yes  ? Wound Image Images linked: 1   ? Dressing Type --   Medihoney, profore lite with cotton  ? Dressing Changed Changed   ? Dressing Status Old drainage   ? Dressing Change Frequency PRN   ? Site / Wound Assessment Yellow;Red;Pink   ? % Wound base Red or Granulating 15%   ? % Wound base Yellow/Fibrinous Exudate 85%   ? Margins Epibole (rolled edges)   ? Drainage Amount Minimal   ? Drainage Description Serosanguineous   ? Treatment Cleansed;Debridement (Selective)   ? Selective Debridement - Location wound bed and periwound   ? Selective Debridement - Tools Used Forceps;Scalpel   ? Selective Debridement - Tissue Removed slough   ? Wound Therapy - Clinical Statement Selective debridement for removal of adherent slough wound bed and dry skin perimeter.  Continued with medihoney to address adherent slough and profore lite for  edema control.  Reviewed importance of compression garments for edema control, pt stated he owned a pair but unsure how long he has had them for.  Educated to change every 6 months.  Pt given measurements to assure he purchases proper size.   ? Wound Therapy - Functional Problem List walking, standing   ? Factors Delaying/Impairing Wound Healing Diabetes Mellitus;Multiple medical problems   ? Hydrotherapy Plan Debridement;Dressing change;Electrical stimulation;Patient/family education;Pulsatile lavage with suction   ? Wound Therapy - Frequency 2X / week   ? Wound Therapy - Current Recommendations PT   ? Wound Plan debride and dressing changes   ? Dressing  medihoney, 4x4, hydrogel, profore lite, netting   ? ?  ?  ? ?  ? ? ? ? ? ? ? ? ? ? PT Education - 08/19/21 1920   ? ? Education Details Educated importance  of wearing compression garments for edema control, encouraged to replace every 6 months.   ? Person(s) Educated Patient;Spouse   ? Methods Explanation   ? Comprehension Verbalized understanding   ? ?  ?  ? ?  ? ? ? PT Short Term Goals - 08/17/21 1507   ? ?  ? PT SHORT TERM GOAL #1  ? Title Patients wound will be free from slough.   ? Time 3   ? Period Weeks   ? Status New   ? Target Date 09/07/21   ? ?  ?  ? ?  ? ? ? ? PT Long Term Goals - 08/17/21 1508   ? ?  ? PT LONG TERM GOAL #1  ? Title Patient wound to be healed to reduce risk of infection.   ? Time 6   ? Period Weeks   ? Status New   ? Target Date 09/28/21   ?  ? PT LONG TERM GOAL #2  ? Title Patient will be able to return to all activities unrestricted for improved ability to perform work functions and participate with family.   ? Time 6   ? Period Weeks   ? Status New   ? Target Date 09/28/21   ? ?  ?  ? ?  ? ? ? ? ? ? ? ? ? ?Patient will benefit from skilled therapeutic intervention in order to improve the following deficits and impairments:    ? ?Visit Diagnosis: ?Other abnormalities of gait and mobility ? ?Wound of right lower extremity, initial encounter ? ? ? ? ?Problem List ?Patient Active Problem List  ? Diagnosis Date Noted  ? Impaired glucose tolerance 08/08/2021  ? GERD (gastroesophageal reflux disease) 08/07/2021  ? Hypokalemia 08/07/2021  ? Psoriatic arthritis ([place]) 08/07/2021  ? Insomnia 08/07/2021  ? PAF (paroxysmal atrial fibrillation) ([place]) 08/07/2021  ? Obesity, Class III, BMI 40-49.9 (morbid obesity) ([place]) 08/07/2021  ? Hypomagnesemia 08/07/2021  ? Post-traumatic wound infection   ? Cellulitis of right lower leg 08/06/2021  ? Asthma, chronic 08/06/2021  ? Type 2 diabetes mellitus ([place]) 08/18/2019  ? Coagulopathy ([place]) 08/18/2019  ? Long term current use of anticoagulant therapy 08/18/2019  ? Atrial fibrillation with RVR ([place]) 08/17/2019  ? COVID-19   ? Hypoxia   ? Immune deficiency disorder ([place])   ? Empyema ([place]) 03/20/2014  ? Empyema of  right pleural space ([place]) 03/18/2014  ? CAP (community acquired pneumonia) 03/14/2014  ? Hydronephrosis, left 03/14/2014  ? Back pain 03/14/2014  ? Hypertension   ? OSA on CPAP   ? Hyperlipemia   ? ?Ihor Austin, LPTA/CLT; CBIS ?  [phone] ? ?Aldona Lento, PTA ?08/19/2021, 7:22 PM ? ?[place] ?[place] ?[address] ?[place], [place], [address] ?Phone: [phone]   Fax:  [phone] ? ?Name: [name] [name] ?MRN: [id number] ?Date of Birth: [birth year] ? ? ? ? ?

## 2021-08-23 ENCOUNTER — Other Ambulatory Visit: Payer: Self-pay

## 2021-08-23 ENCOUNTER — Ambulatory Visit (HOSPITAL_COMMUNITY): Payer: No Typology Code available for payment source | Admitting: Physical Therapy

## 2021-08-23 DIAGNOSIS — R2689 Other abnormalities of gait and mobility: Secondary | ICD-10-CM

## 2021-08-23 NOTE — Therapy (Signed)
[place] ?[facility] ?[address] ?[place], [place], [address] ?Phone: [phone]   Fax:  [phone] ? ?Wound Care Therapy ? ?Patient Details  ?Name: [name] [name] ?MRN: [id number] ?Date of Birth: 1960/08/24 ?Referring Provider (PT): Catarina Hartshorn MD ? ? ?Encounter Date: 08/23/2021 ? ? PT End of Session - 08/23/21 0937   ? ? Visit Number 3   ? Number of Visits 12   ? Date for PT Re-Evaluation 09/28/21   ? Authorization Type BCBS (no auth, 60 VL)   ? Authorization - Visit Number 3   ? Authorization - Number of Visits 60   ? PT Start Time 0900   ? PT Stop Time [phone]   ? PT Time Calculation (min) 38 min   ? Activity Tolerance Patient tolerated treatment well;No increased pain   ? Behavior During Therapy [facility] for tasks assessed/performed   ? ?  ?  ? ?  ? ? ?Past Medical History:  ?Diagnosis Date  ? Asthma   ? Atrial fibrillation ([facility])   ? Documented March 2021  ? Essential hypertension   ? History of nephrolithiasis   ? Hyperlipemia   ? Obesity   ? BMI 42.6  ? Psoriasis   ? Psoriatic arthritis ([facility])   ? Seasonal allergies   ? Sleep apnea   ? ? ?Past Surgical History:  ?Procedure Laterality Date  ? NOSE SURGERY    ? VIDEO ASSISTED THORACOSCOPY (VATS)/DECORTICATION Right 03/20/2014  ? Procedure: VIDEO ASSISTED THORACOSCOPY (VATS)/DECORTICATION;  Surgeon: Kerin Perna, MD;  Location: [facility] OR;  Service: Thoracic;  Laterality: Right;  ? ? ?There were no vitals filed for this visit. ? ? ? ? ? ? ? ? ? ? ? ? ? ? Wound Therapy - 08/23/21 0001   ? ? Subjective Pt states that he has no pain.  He has been up doing a lot of yard work Saturday and the dressing slid down slightly.   ? Patient and Family Stated Goals wound to heal   ? Date of Onset 07/20/21   ? Prior Treatments antibiotics, silvadene   ? Pain Scale 0-10   ? Pain Score 0-No pain   ? Evaluation and Treatment Procedures Explained to Patient/Family Yes   ? Evaluation and Treatment Procedures agreed to   ? Wound Properties Date First Assessed:  08/17/21 Time First Assessed: 1430 Wound Type: Laceration Location: Pretibial Location Orientation: Right;Anterior Wound Description (Comments): R shin wound Present on Admission: Yes  ? Wound Image Images linked: 1   ? Dressing Type Compression wrap   Medihoney, profore lite with cotton  ? Dressing Changed Changed   ? Dressing Status Old drainage   ? Dressing Change Frequency PRN   ? Site / Wound Assessment Yellow;Red;Pink   ? % Wound base Red or Granulating 40%   ? % Wound base Yellow/Fibrinous Exudate 60%   ? Margins Epibole (rolled edges)   ? Drainage Amount Minimal   ? Drainage Description Serous   ? Treatment Cleansed;Debridement (Selective)   ? Selective Debridement - Location wound bed and periwound   ? Selective Debridement - Tools Used Forceps;Scalpel   ? Selective Debridement - Tissue Removed slough   ? Wound Therapy - Clinical Statement PT given sheet for elastic therapy.  Pt has increased sensitivity to lower wound debridement where wound appears to be the deepest.  Therapist used scapel in this area attempting to cross hatch to allow honey to seep in and loosen eschar.  Wound continues to increase  in granulation.  Wound size remains the same at this time.   ? Wound Therapy - Functional Problem List walking, standing   ? Factors Delaying/Impairing Wound Healing Diabetes Mellitus;Multiple medical problems   ? Hydrotherapy Plan Debridement;Dressing change;Electrical stimulation;Patient/family education;Pulsatile lavage with suction   ? Wound Therapy - Frequency 2X / week   ? Wound Therapy - Current Recommendations PT   ? Wound Plan debride and dressing changes   ? Dressing  medihoney, 4x4, hydrogel, profore lite, netting   ? ?  ?  ? ?  ? ? ? ? ? ? ? ? ? ? ? ? PT Short Term Goals - 08/23/21 0944   ? ?  ? PT SHORT TERM GOAL #1  ? Title Patients wound will be free from slough.   ? Time 3   ? Period Weeks   ? Status On-going   ? Target Date 09/07/21   ? ?  ?  ? ?  ? ? ? ? PT Long Term Goals - 08/23/21 0944    ? ?  ? PT LONG TERM GOAL #1  ? Title Patient wound to be healed to reduce risk of infection.   ? Time 6   ? Period Weeks   ? Status On-going   ? Target Date 09/28/21   ?  ? PT LONG TERM GOAL #2  ? Title Patient will be able to return to all activities unrestricted for improved ability to perform work functions and participate with family.   ? Time 6   ? Period Weeks   ? Status On-going   ? Target Date 09/28/21   ? ?  ?  ? ?  ? ? ? ? ? ? ? ? Plan - 08/23/21 0944   ? ? Clinical Impression Statement see above   ? Personal Factors and Comorbidities Fitness;Comorbidity 3+   ? Comorbidities HTN, HLD, a fib, psoriatic arthritis, DM   ? Examination-Activity Limitations Locomotion Level;Transfers;Stand;Stairs;Squat   ? Examination-Participation Restrictions Occupation;Cleaning;Community Activity;Shop;Volunteer;[employer] Work   ? Stability/Clinical Decision Making Evolving/Moderate complexity   ? Rehab Potential Good   ? PT Frequency 2x / week   ? PT Duration 6 weeks   ? PT Treatment/Interventions ADLs/Self Care Home Management;Gait training;Stair training;Functional mobility training;Manual techniques;Therapeutic activities;Therapeutic exercise;Patient/family education;Orthotic Fit/Training;Compression bandaging;Manual lymph drainage   ? PT Next Visit Plan debride and dressing changes to promote wound healing; measure wound   ? Consulted and Agree with Plan of Care Patient;Family member/caregiver   ? Family Member Consulted wife   ? ?  ?  ? ?  ? ? ?Patient will benefit from skilled therapeutic intervention in order to improve the following deficits and impairments:  Abnormal gait, Difficulty walking, Pain, Decreased activity tolerance, Increased edema, Decreased skin integrity ? ?Visit Diagnosis: ?Other abnormalities of gait and mobility ? ? ? ? ?Problem List ?Patient Active Problem List  ? Diagnosis Date Noted  ? Impaired glucose tolerance 08/08/2021  ? GERD (gastroesophageal reflux disease) 08/07/2021  ? Hypokalemia 08/07/2021   ? Psoriatic arthritis (HCC) 08/07/2021  ? Insomnia 08/07/2021  ? PAF (paroxysmal atrial fibrillation) (HCC) 08/07/2021  ? Obesity, Class III, BMI 40-49.9 (morbid obesity) (HCC) 08/07/2021  ? Hypomagnesemia 08/07/2021  ? Post-traumatic wound infection   ? Cellulitis of right lower leg 08/06/2021  ? Asthma, chronic 08/06/2021  ? Type 2 diabetes mellitus (HCC) 08/18/2019  ? Coagulopathy (HCC) 08/18/2019  ? Long term current use of anticoagulant therapy 08/18/2019  ? Atrial fibrillation with RVR (HCC) 08/17/2019  ? COVID-19   ?  Hypoxia   ? Immune deficiency disorder (HCC)   ? Empyema (HCC) 03/20/2014  ? Empyema of right pleural space (HCC) 03/18/2014  ? CAP (community acquired pneumonia) 03/14/2014  ? Hydronephrosis, left 03/14/2014  ? Back pain 03/14/2014  ? Hypertension   ? OSA on CPAP   ? Hyperlipemia   ?Virgina Organ, PT CLT ?[phone]  ?08/23/2021, 9:45 AM ? ?[place] ?[facility] ?[address] ?[place], [place], [address] ?Phone: [phone]   Fax:  [phone] ? ?Name: [name] [name] ?MRN: [id number] ?Date of Birth: February 03, 1961 ? ? ? ? ?

## 2021-08-23 NOTE — Therapy (Signed)
[place] ?[facility] ?[address] ?[place], [place], [address] ?Phone: [phone]   Fax:  [phone] ? ?Wound Care Therapy ? ?Patient Details  ?Name: [name] [name] ?MRN: [id number] ?Date of Birth: 1960/11/10 ?Referring Provider (PT): Catarina Hartshorn MD ? ? ?Encounter Date: 08/23/2021 ? ? PT End of Session - 08/23/21 0937   ? ? Visit Number 3   ? Number of Visits 12   ? Date for PT Re-Evaluation 09/28/21   ? Authorization Type BCBS (no auth, 60 VL)   ? Authorization - Visit Number 3   ? Authorization - Number of Visits 60   ? PT Start Time 0900   ? PT Stop Time [phone]   ? PT Time Calculation (min) 38 min   ? Activity Tolerance Patient tolerated treatment well;No increased pain   ? Behavior During Therapy [facility] for tasks assessed/performed   ? ?  ?  ? ?  ? ? ?Past Medical History:  ?Diagnosis Date  ? Asthma   ? Atrial fibrillation (HCC)   ? Documented March 2021  ? Essential hypertension   ? History of nephrolithiasis   ? Hyperlipemia   ? Obesity   ? BMI 42.6  ? Psoriasis   ? Psoriatic arthritis (H[facility])   ? Seasonal allergies   ? Sleep apnea   ? ? ?Past Surgical History:  ?Procedure Laterality Date  ? NOSE SURGERY    ? VIDEO ASSISTED THORACOSCOPY (VATS)/DECORTICATION Right 03/20/2014  ? Procedure: VIDEO ASSISTED THORACOSCOPY (VATS)/DECORTICATION;  Surgeon: Kerin Perna, MD;  Location: [facility] OR;  Service: Thoracic;  Laterality: Right;  ? ? ?There were no vitals filed for this visit. ? ? ? ? ? ? ? ? ? ? ? ? ? ? ? ? ? ? ? ? ? ? ? ? ? PT Short Term Goals - 08/17/21 1507   ? ?  ? PT SHORT TERM GOAL #1  ? Title Patients wound will be free from slough.   ? Time 3   ? Period Weeks   ? Status New   ? Target Date 09/07/21   ? ?  ?  ? ?  ? ? ? ? PT Long Term Goals - 08/17/21 1508   ? ?  ? PT LONG TERM GOAL #1  ? Title Patient wound to be healed to reduce risk of infection.   ? Time 6   ? Period Weeks   ? Status New   ? Target Date 09/28/21   ?  ? PT LONG TERM GOAL #2  ? Title Patient will be able to return to  all activities unrestricted for improved ability to perform work functions and participate with family.   ? Time 6   ? Period Weeks   ? Status New   ? Target Date 09/28/21   ? ?  ?  ? ?  ? ? ? ? ? ? ? ? ? ?Patient will benefit from skilled therapeutic intervention in order to improve the following deficits and impairments:    ? ?Visit Diagnosis: ?Other abnormalities of gait and mobility ? ? ? ? ?Problem List ?Patient Active Problem List  ? Diagnosis Date Noted  ? Impaired glucose tolerance 08/08/2021  ? GERD (gastroesophageal reflux disease) 08/07/2021  ? Hypokalemia 08/07/2021  ? Psoriatic arthritis (HCC) 08/07/2021  ? Insomnia 08/07/2021  ? PAF (paroxysmal atrial fibrillation) (HCC) 08/07/2021  ? Obesity, Class III, BMI 40-49.9 (morbid obesity) (HCC) 08/07/2021  ? Hypomagnesemia 08/07/2021  ? Post-traumatic wound infection   ?  Cellulitis of right lower leg 08/06/2021  ? Asthma, chronic 08/06/2021  ? Type 2 diabetes mellitus (HCC) 08/18/2019  ? Coagulopathy (HCC) 08/18/2019  ? Long term current use of anticoagulant therapy 08/18/2019  ? Atrial fibrillation with RVR (HCC) 08/17/2019  ? COVID-19   ? Hypoxia   ? Immune deficiency disorder (HCC)   ? Empyema (HCC) 03/20/2014  ? Empyema of right pleural space (HCC) 03/18/2014  ? CAP (community acquired pneumonia) 03/14/2014  ? Hydronephrosis, left 03/14/2014  ? Back pain 03/14/2014  ? Hypertension   ? OSA on CPAP   ? Hyperlipemia   ? ? ?Gisselle Galvis,CINDY, PT ?08/23/2021, 9:38 AM ? ? ?[facility] ?[address] ?[place], [place], [address] ?Phone: [phone]   Fax:  [phone] ? ?Name: Hayes Czaja ?MRN: [id number] ?Date of Birth: February 13, 1961 ? ? ? ? ?

## 2021-08-26 ENCOUNTER — Encounter (HOSPITAL_COMMUNITY): Payer: Self-pay | Admitting: Physical Therapy

## 2021-08-26 ENCOUNTER — Ambulatory Visit (HOSPITAL_COMMUNITY): Payer: No Typology Code available for payment source | Admitting: Physical Therapy

## 2021-08-26 ENCOUNTER — Other Ambulatory Visit: Payer: Self-pay

## 2021-08-26 DIAGNOSIS — R2689 Other abnormalities of gait and mobility: Secondary | ICD-10-CM | POA: Diagnosis not present

## 2021-08-26 NOTE — Therapy (Signed)
[place] ?[facility] ?[address] ?[place], [place], [address] ?Phone: [phone]   Fax:  [phone] ? ?Wound Care Therapy ? ?Patient Details  ?Name: [name] [name] ?MRN: [id number] ?Date of Birth: [birth year] ?Referring Provider (PT): Catarina Hartshorn MD ? ? ?Encounter Date: 08/26/2021 ? ? PT End of Session - 08/26/21 1132   ? ? Visit Number 4   ? Number of Visits 12   ? Date for PT Re-Evaluation 09/28/21   ? Authorization Type BCBS (no auth, 60 VL)   ? Authorization - Visit Number 4   ? Authorization - Number of Visits 60   ? PT Start Time 1135   ? PT Stop Time 1205   ? PT Time Calculation (min) 30 min   ? Activity Tolerance Patient tolerated treatment well;No increased pain   ? Behavior During Therapy [facility] for tasks assessed/performed   ? ?  ?  ? ?  ? ? ?Past Medical History:  ?Diagnosis Date  ? Asthma   ? Atrial fibrillation (HCC)   ? Documented March 2021  ? Essential hypertension   ? History of nephrolithiasis   ? Hyperlipemia   ? Obesity   ? BMI 42.6  ? Psoriasis   ? Psoriatic arthritis (HCC)   ? Seasonal allergies   ? Sleep apnea   ? ? ?Past Surgical History:  ?Procedure Laterality Date  ? NOSE SURGERY    ? VIDEO ASSISTED THORACOSCOPY (VATS)/DECORTICATION Right 03/20/2014  ? Procedure: VIDEO ASSISTED THORACOSCOPY (VATS)/DECORTICATION;  Surgeon: Kerin Perna, MD;  Location[facility] OR;  Service: Thoracic;  Laterality: Right;  ? ? ?There were no vitals filed for this visit. ? ? ? ? ? ? ? ? ? ? ? ? ? ? Wound Therapy - 08/26/21 1134   ? ? Subjective pt states it has not been bothering him.   ? Patient and Family Stated Goals wound to heal   ? Date of Onset 07/20/21   ? Prior Treatments antibiotics, silvadene   ? Pain Scale 0-10   ? Pain Score 0-No pain   ? Evaluation and Treatment Procedures Explained to Patient/Family Yes   ? Evaluation and Treatment Procedures agreed to   ? Wound Properties Date First Assessed: 08/17/21 Time First Assessed: 1430 Wound Type: Laceration Location: Pretibial  Location Orientation: Right;Anterior Wound Description (Comments): R shin wound Present on Admission: Yes  ? Wound Image Images linked: 1   ? Dressing Type Compression wrap   Medihoney, profore lite with cotton  ? Dressing Changed Changed   ? Dressing Status Old drainage   ? Dressing Change Frequency PRN   ? Site / Wound Assessment Yellow;Red;Pink   ? % Wound base Red or Granulating 45%   ? % Wound base Yellow/Fibrinous Exudate 55%   ? Wound Length (cm) 4.8 cm   ? Wound Width (cm) 1.8 cm   ? Wound Depth (cm) 0.5 cm   ? Wound Volume (cm^3) 4.32 cm^3   ? Wound Surface Area (cm^2) 8.64 cm^2   ? Margins Attached edges (approximated)   ? Drainage Amount Minimal   ? Drainage Description Serous   ? Treatment Cleansed;Debridement (Selective)   ? Selective Debridement - Location wound bed and periwound   ? Selective Debridement - Tools Used Forceps;Scalpel   ? Selective Debridement - Tissue Removed slough   ? Wound Therapy - Clinical Statement Wound photographed and measured this session with vast reduction in size as compared to initial evaluation 9 days ago. pt with improved tolerance  to debridement with most slough remaining along lateral border of wound.  continued with medihoney gel and profore lite system.  LE cleansed and moisturized well prior to rebandaging.   ? Wound Therapy - Functional Problem List walking, standing   ? Factors Delaying/Impairing Wound Healing Diabetes Mellitus;Multiple medical problems   ? Hydrotherapy Plan Debridement;Dressing change;Electrical stimulation;Patient/family education;Pulsatile lavage with suction   ? Wound Therapy - Frequency 2X / week   ? Wound Therapy - Current Recommendations PT   ? Wound Plan debride and dressing changes   ? Dressing  medihoney, 4x4, profore lite, netting   ? ?  ?  ? ?  ? ? ? ? ? ? ? ? ? ? ? ? PT Short Term Goals - 08/23/21 0944   ? ?  ? PT SHORT TERM GOAL #1  ? Title Patients wound will be free from slough.   ? Time 3   ? Period Weeks   ? Status On-going   ?  Target Date 09/07/21   ? ?  ?  ? ?  ? ? ? ? PT Long Term Goals - 08/23/21 0944   ? ?  ? PT LONG TERM GOAL #1  ? Title Patient wound to be healed to reduce risk of infection.   ? Time 6   ? Period Weeks   ? Status On-going   ? Target Date 09/28/21   ?  ? PT LONG TERM GOAL #2  ? Title Patient will be able to return to all activities unrestricted for improved ability to perform work functions and participate with family.   ? Time 6   ? Period Weeks   ? Status On-going   ? Target Date 09/28/21   ? ?  ?  ? ?  ? ? ? ? ? ? ? ? ? ?Patient will benefit from skilled therapeutic intervention in order to improve the following deficits and impairments:    ? ?Visit Diagnosis: ?Other abnormalities of gait and mobility ? ?Wound of right lower extremity, initial encounter ? ? ? ? ?Problem List ?Patient Active Problem List  ? Diagnosis Date Noted  ? Impaired glucose tolerance 08/08/2021  ? GERD (gastroesophageal reflux disease) 08/07/2021  ? Hypokalemia 08/07/2021  ? Psoriatic arthritis (HCC) 08/07/2021  ? Insomnia 08/07/2021  ? PAF (paroxysmal atrial fibrillation) (HCC) 08/07/2021  ? Obesity, Class III, BMI 40-49.9 (morbid obesity) (HCC) 08/07/2021  ? Hypomagnesemia 08/07/2021  ? Post-traumatic wound infection   ? Cellulitis of right lower leg 08/06/2021  ? Asthma, chronic 08/06/2021  ? Type 2 diabetes mellitus (HCC) 08/18/2019  ? Coagulopathy (HCC) 08/18/2019  ? Long term current use of anticoagulant therapy 08/18/2019  ? Atrial fibrillation with RVR (HCC) 08/17/2019  ? COVID-19   ? Hypoxia   ? Immune deficiency disorder (HCC)   ? Empyema (HCC) 03/20/2014  ? Empyema of right pleural space (HCC) 03/18/2014  ? CAP (community acquired pneumonia) 03/14/2014  ? Hydronephrosis, left 03/14/2014  ? Back pain 03/14/2014  ? Hypertension   ? OSA on CPAP   ? Hyperlipemia   ? ?Jordy Verba Kae Heller, PTA/CLT, WTA ?[phone] ? ?Emeline Gins B, PTA ?08/26/2021, 12:36 PM ? ?[place] ?[facility] ?[address] ?[place], [place], [address] ?Phone: [phone]   Fax:  [phone] ? ?Name: [name] [name] ?MRN: [id number] ?Date of Birth: 11-May-1961 ? ? ? ? ?

## 2021-08-31 ENCOUNTER — Other Ambulatory Visit: Payer: Self-pay

## 2021-08-31 ENCOUNTER — Ambulatory Visit (HOSPITAL_COMMUNITY): Payer: No Typology Code available for payment source | Admitting: Physical Therapy

## 2021-08-31 DIAGNOSIS — R2689 Other abnormalities of gait and mobility: Secondary | ICD-10-CM

## 2021-08-31 DIAGNOSIS — S81801A Unspecified open wound, right lower leg, initial encounter: Secondary | ICD-10-CM

## 2021-08-31 NOTE — Therapy (Signed)
[place] ?[facility] ?[address] ?[place], [place], [address] ?Phone: [phone]   Fax:  [phone] ? ?Wound Care Therapy ? ?Patient Details  ?Name: [name] [name] ?MRN: [id number] ?Date of Birth: 1960/09/14 ?Referring Provider (PT): Catarina Hartshorn MD ? ? ?Encounter Date: 08/31/2021 ? ? PT End of Session - 08/31/21 1357   ? ? Visit Number 5   ? Number of Visits 12   ? Date for PT Re-Evaluation 09/28/21   ? Authorization Type BCBS (no auth, 60 VL)   ? Authorization - Visit Number 5   ? Authorization - Number of Visits 60   ? PT Start Time 1315   ? PT Stop Time 1350   ? PT Time Calculation (min) 35 min   ? Activity Tolerance Patient tolerated treatment well;No increased pain   ? Behavior During Therapy [facility] for tasks assessed/performed   ? ?  ?  ? ?  ? ? ?Past Medical History:  ?Diagnosis Date  ? Asthma   ? Atrial fibrillation (HCC)   ? Documented March 2021  ? Essential hypertension   ? History of nephrolithiasis   ? Hyperlipemia   ? Obesity   ? BMI 42.6  ? Psoriasis   ? Psoriatic arthritis (HCC)   ? Seasonal allergies   ? Sleep apnea   ? ? ?Past Surgical History:  ?Procedure Laterality Date  ? NOSE SURGERY    ? VIDEO ASSISTED THORACOSCOPY (VATS)/DECORTICATION Right 03/20/2014  ? Procedure: VIDEO ASSISTED THORACOSCOPY (VATS)/DECORTICATION;  Surgeon: Kerin Perna, MD;  Location: [facility] OR;  Service: Thoracic;  Laterality: Right;  ? ? ?There were no vitals filed for this visit. ? ? ? ? ? ? ? ? ? ? ? ? ? ? Wound Therapy - 08/31/21 1358   ? ? Subjective pt states he goes to work tomorrow to check in about when he returns to work.  Reports no pain or issues.   ? Patient and Family Stated Goals wound to heal   ? Date of Onset 07/20/21   ? Prior Treatments antibiotics, silvadene   ? Pain Scale 0-10   ? Evaluation and Treatment Procedures Explained to Patient/Family Yes   ? Evaluation and Treatment Procedures agreed to   ? Wound Properties Date First Assessed: 08/17/21 Time First Assessed: 1430 Wound  Type: Laceration Location: Pretibial Location Orientation: Right;Anterior Wound Description (Comments): R shin wound Present on Admission: Yes  ? Dressing Type Compression wrap   Medihoney, profore lite with cotton  ? Dressing Changed Changed   ? Dressing Status Old drainage   ? Dressing Change Frequency PRN   ? Site / Wound Assessment Yellow;Red;Pink   ? % Wound base Red or Granulating 60%   ? % Wound base Yellow/Fibrinous Exudate 40%   ? Margins Attached edges (approximated)   ? Drainage Amount Minimal   ? Drainage Description Serosanguineous   ? Treatment Cleansed;Debridement (Selective)   ? Selective Debridement - Location wound bed and periwound   ? Selective Debridement - Tools Used Forceps;Scalpel   ? Selective Debridement - Tissue Removed slough   ? Wound Therapy - Clinical Statement wound continues to approximate well with improving granualtion.  Top lip of wound now healed with main opening improving each session.  Pt tolerates debridement well. cleansed and continued with medihoney dressing.  Perimeter cleansed and moisturized well prior to rebandaging.   ? Wound Therapy - Functional Problem List walking, standing   ? Factors Delaying/Impairing Wound Healing Diabetes Mellitus;Multiple medical problems   ?  Hydrotherapy Plan Debridement;Dressing change;Electrical stimulation;Patient/family education;Pulsatile lavage with suction   ? Wound Therapy - Frequency 2X / week   ? Wound Therapy - Current Recommendations PT   ? Wound Plan debride and dressing changes.  photograph and measure next session.   ? Dressing  medihoney, 4x4, profore lite, netting   ? ?  ?  ? ?  ? ? ? ? ? ? ? ? ? ? ? ? PT Short Term Goals - 08/23/21 0944   ? ?  ? PT SHORT TERM GOAL #1  ? Title Patients wound will be free from slough.   ? Time 3   ? Period Weeks   ? Status On-going   ? Target Date 09/07/21   ? ?  ?  ? ?  ? ? ? ? PT Long Term Goals - 08/23/21 0944   ? ?  ? PT LONG TERM GOAL #1  ? Title Patient wound to be healed to reduce risk  of infection.   ? Time 6   ? Period Weeks   ? Status On-going   ? Target Date 09/28/21   ?  ? PT LONG TERM GOAL #2  ? Title Patient will be able to return to all activities unrestricted for improved ability to perform work functions and participate with family.   ? Time 6   ? Period Weeks   ? Status On-going   ? Target Date 09/28/21   ? ?  ?  ? ?  ? ? ? ? ? ? ? ? ? ?Patient will benefit from skilled therapeutic intervention in order to improve the following deficits and impairments:    ? ?Visit Diagnosis: ?Other abnormalities of gait and mobility ? ?Wound of right lower extremity, initial encounter ? ? ? ? ?Problem List ?Patient Active Problem List  ? Diagnosis Date Noted  ? Impaired glucose tolerance 08/08/2021  ? GERD (gastroesophageal reflux disease) 08/07/2021  ? Hypokalemia 08/07/2021  ? Psoriatic arthritis (HCC) 08/07/2021  ? Insomnia 08/07/2021  ? PAF (paroxysmal atrial fibrillation) (HCC) 08/07/2021  ? Obesity, Class III, BMI 40-49.9 (morbid obesity) (HCC) 08/07/2021  ? Hypomagnesemia 08/07/2021  ? Post-traumatic wound infection   ? Cellulitis of right lower leg 08/06/2021  ? Asthma, chronic 08/06/2021  ? Type 2 diabetes mellitus (HCC) 08/18/2019  ? Coagulopathy (HCC) 08/18/2019  ? Long term current use of anticoagulant therapy 08/18/2019  ? Atrial fibrillation with RVR (HCC) 08/17/2019  ? COVID-19   ? Hypoxia   ? Immune deficiency disorder (HCC)   ? Empyema (HCC) 03/20/2014  ? Empyema of right pleural space (HCC) 03/18/2014  ? CAP (community acquired pneumonia) 03/14/2014  ? Hydronephrosis, left 03/14/2014  ? Back pain 03/14/2014  ? Hypertension   ? OSA on CPAP   ? Hyperlipemia   ? ?[place] Kae Heller, PTA/CLT, WTA ?[phone] ? ?Emeline Gins B, PTA ?08/31/2021, 2:01 PM ? ?[place] ?[facility] ?[address] ?[place], [place], [address] ?Phone: [phone]   Fax:  [phone] ? ?Name: [name] [name] ?MRN: [id number] ?Date of Birth: [birth year] ? ? ? ? ?

## 2021-09-02 ENCOUNTER — Ambulatory Visit (HOSPITAL_COMMUNITY): Payer: No Typology Code available for payment source | Admitting: Physical Therapy

## 2021-09-02 ENCOUNTER — Other Ambulatory Visit: Payer: Self-pay

## 2021-09-02 DIAGNOSIS — R2689 Other abnormalities of gait and mobility: Secondary | ICD-10-CM | POA: Diagnosis not present

## 2021-09-02 DIAGNOSIS — S81801A Unspecified open wound, right lower leg, initial encounter: Secondary | ICD-10-CM

## 2021-09-02 NOTE — Therapy (Signed)
[place] ?[facility] ?[address] ?[place], [place], [address] ?Phone: [phone]   Fax:  [phone] ? ?Wound Care Therapy ? ?Patient Details  ?Name: [name] [name] ?MRN: [id number] ?Date of Birth: 12/03/1960 ?Referring Provider (PT): Catarina Hartshorn MD ? ? ?Encounter Date: 09/02/2021 ? ? PT End of Session - 09/02/21 1352   ? ? Visit Number 6   ? Number of Visits 12   ? Date for PT Re-Evaluation 09/28/21   ? Authorization Type BCBS (no auth, 60 VL)   ? Authorization - Visit Number 6   ? Authorization - Number of Visits 60   ? PT Start Time 1320   ? PT Stop Time 1350   ? PT Time Calculation (min) 30 min   ? Activity Tolerance Patient tolerated treatment well;No increased pain   ? Behavior During Therapy [facility] for tasks assessed/performed   ? ?  ?  ? ?  ? ? ?Past Medical History:  ?Diagnosis Date  ? Asthma   ? Atrial fibrillation (HCC)   ? Documented March 2021  ? Essential hypertension   ? History of nephrolithiasis   ? Hyperlipemia   ? Obesity   ? BMI 42.6  ? Psoriasis   ? Psoriatic arthritis (HCC)   ? Seasonal allergies   ? Sleep apnea   ? ? ?Past Surgical History:  ?Procedure Laterality Date  ? NOSE SURGERY    ? VIDEO ASSISTED THORACOSCOPY (VATS)/DECORTICATION Right 03/20/2014  ? Procedure: VIDEO ASSISTED THORACOSCOPY (VATS)/DECORTICATION;  Surgeon: Kerin Perna, MD;  Location: [facility] OR;  Service: Thoracic;  Laterality: Right;  ? ? ?There were no vitals filed for this visit. ? ? ? ? ? ? ? ? ? ? ? ? ? ? Wound Therapy - 09/02/21 1353   ? ? Subjective Pt states he is out of work for 4 more weeks.  STates he is having no pain or issues.   ? Patient and Family Stated Goals wound to heal   ? Date of Onset 07/20/21   ? Prior Treatments antibiotics, silvadene   ? Pain Scale 0-10   ? Pain Score 0-No pain   ? Evaluation and Treatment Procedures Explained to Patient/Family Yes   ? Evaluation and Treatment Procedures agreed to   ? Wound Properties Date First Assessed: 08/17/21 Time First Assessed: 1430  Wound Type: Laceration Location: Pretibial Location Orientation: Right;Anterior Wound Description (Comments): R shin wound Present on Admission: Yes  ? Wound Image Images linked: 1   ? Dressing Type Compression wrap   Medihoney, profore lite with cotton  ? Dressing Changed Changed   ? Dressing Status Old drainage   ? Dressing Change Frequency PRN   ? Site / Wound Assessment Yellow;Red;Pink   ? % Wound base Red or Granulating 70%   ? % Wound base Yellow/Fibrinous Exudate 30%   ? Wound Length (cm) 3.6 cm   ? Wound Width (cm) 1.7 cm   ? Wound Depth (cm) 0.3 cm   ? Wound Volume (cm^3) 1.84 cm^3   ? Wound Surface Area (cm^2) 6.12 cm^2   ? Margins Attached edges (approximated)   ? Drainage Amount Minimal   ? Drainage Description Serosanguineous   ? Treatment Cleansed;Debridement (Selective)   ? Selective Debridement - Location wound bed and periwound   ? Selective Debridement - Tools Used Forceps;Scalpel   ? Selective Debridement - Tissue Removed slough   ? Wound Therapy - Clinical Statement wound measured and photographed this session. continued approximation and filling in  of wound, 10% more granulated as well.  Slough covering wound but easily debrided with some adherent slough beneath.  Continued with medihoney gel on 2X2 and profore lite system for compression.   ? Wound Therapy - Functional Problem List walking, standing   ? Factors Delaying/Impairing Wound Healing Diabetes Mellitus;Multiple medical problems   ? Hydrotherapy Plan Debridement;Dressing change;Electrical stimulation;Patient/family education;Pulsatile lavage with suction   ? Wound Therapy - Frequency 2X / week   ? Wound Therapy - Current Recommendations PT   ? Wound Plan debride and dressing changes.  photograph and measure weekly.   ? Dressing  medihoney, 4x4, profore lite, netting   ? ?  ?  ? ?  ? ? ? ? ? ? ? ? ? ? ? ? PT Short Term Goals - 08/23/21 0944   ? ?  ? PT SHORT TERM GOAL #1  ? Title Patients wound will be free from slough.   ? Time 3   ?  Period Weeks   ? Status On-going   ? Target Date 09/07/21   ? ?  ?  ? ?  ? ? ? ? PT Long Term Goals - 08/23/21 0944   ? ?  ? PT LONG TERM GOAL #1  ? Title Patient wound to be healed to reduce risk of infection.   ? Time 6   ? Period Weeks   ? Status On-going   ? Target Date 09/28/21   ?  ? PT LONG TERM GOAL #2  ? Title Patient will be able to return to all activities unrestricted for improved ability to perform work functions and participate with family.   ? Time 6   ? Period Weeks   ? Status On-going   ? Target Date 09/28/21   ? ?  ?  ? ?  ? ? ? ? ? ? ? ? ? ?Patient will benefit from skilled therapeutic intervention in order to improve the following deficits and impairments:    ? ?Visit Diagnosis: ?Wound of right lower extremity, initial encounter ? ?Other abnormalities of gait and mobility ? ? ? ? ?Problem List ?Patient Active Problem List  ? Diagnosis Date Noted  ? Impaired glucose tolerance 08/08/2021  ? GERD (gastroesophageal reflux disease) 08/07/2021  ? Hypokalemia 08/07/2021  ? Psoriatic arthritis (HCC) 08/07/2021  ? Insomnia 08/07/2021  ? PAF (paroxysmal atrial fibrillation) (HCC) 08/07/2021  ? Obesity, Class III, BMI 40-49.9 (morbid obesity) (HCC) 08/07/2021  ? Hypomagnesemia 08/07/2021  ? Post-traumatic wound infection   ? Cellulitis of right lower leg 08/06/2021  ? Asthma, chronic 08/06/2021  ? Type 2 diabetes mellitus (HCC) 08/18/2019  ? Coagulopathy (HCC) 08/18/2019  ? Long term current use of anticoagulant therapy 08/18/2019  ? Atrial fibrillation with RVR (HCC) 08/17/2019  ? COVID-19   ? Hypoxia   ? Immune deficiency disorder (HCC)   ? Empyema (HCC) 03/20/2014  ? Empyema of right pleural space (HCC) 03/18/2014  ? CAP (community acquired pneumonia) 03/14/2014  ? Hydronephrosis, left 03/14/2014  ? Back pain 03/14/2014  ? Hypertension   ? OSA on CPAP   ? Hyperlipemia   ? ?Kennedi [place] Kae Heller, PTA/CLT, WTA ?[phone] ? ?Emeline Gins B, PTA ?09/02/2021, 1:56 PM ? ?[place] ?[facility] ?[address] ?[place], [place], [address] ?Phone: [phone]   Fax:  [phone] ? ?Name: Zair Borawski ?MRN: [id number] ?Date of Birth: 02-22-1961 ? ? ? ? ?

## 2021-09-07 ENCOUNTER — Other Ambulatory Visit: Payer: Self-pay

## 2021-09-07 ENCOUNTER — Ambulatory Visit (HOSPITAL_COMMUNITY): Payer: No Typology Code available for payment source | Admitting: Physical Therapy

## 2021-09-07 DIAGNOSIS — S81801A Unspecified open wound, right lower leg, initial encounter: Secondary | ICD-10-CM

## 2021-09-07 DIAGNOSIS — R2689 Other abnormalities of gait and mobility: Secondary | ICD-10-CM

## 2021-09-07 NOTE — Therapy (Signed)
[place] ?[facility] ?[address] ?[place], [place], [address] ?Phone: [phone]   Fax:  [phone] ? ?Wound Care Therapy ? ?Patient Details  ?Name: Adden Strout ?MRN: [id number] ?Date of Birth: 03-16-1961 ?Referring Provider (PT): Catarina Hartshorn MD ? ? ?Encounter Date: 09/07/2021 ? ? PT End of Session - 09/07/21 1350   ? ? Visit Number 7   ? Number of Visits 12   ? Date for PT Re-Evaluation 09/28/21   ? Authorization Type BCBS (no auth, 60 VL)   ? Authorization - Visit Number 7   ? Authorization - Number of Visits 60   ? PT Start Time 1145   ? PT Stop Time 1215   ? PT Time Calculation (min) 30 min   ? Activity Tolerance Patient tolerated treatment well;No increased pain   ? Behavior During Therapy [facility] for tasks assessed/performed   ? ?  ?  ? ?  ? ? ?Past Medical History:  ?Diagnosis Date  ? Asthma   ? Atrial fibrillation (HCC)   ? Documented March 2021  ? Essential hypertension   ? History of nephrolithiasis   ? Hyperlipemia   ? Obesity   ? BMI 42.6  ? Psoriasis   ? Psoriatic arthritis (HCC)   ? Seasonal allergies   ? Sleep apnea   ? ? ?Past Surgical History:  ?Procedure Laterality Date  ? NOSE SURGERY    ? VIDEO ASSISTED THORACOSCOPY (VATS)/DECORTICATION Right 03/20/2014  ? Procedure: VIDEO ASSISTED THORACOSCOPY (VATS)/DECORTICATION;  Surgeon: Kerin Perna, MD;  Location: [facility] OR;  Service: Thoracic;  Laterality: Right;  ? ? ?There were no vitals filed for this visit. ? ? ? ? ? ? ? ? ? ? ? ? ? ? Wound Therapy - 09/07/21 1350   ? ? Subjective Pt does not report any issues or pain today.   ? Patient and Family Stated Goals wound to heal   ? Date of Onset 07/20/21   ? Prior Treatments antibiotics, silvadene   ? Evaluation and Treatment Procedures Explained to Patient/Family Yes   ? Evaluation and Treatment Procedures agreed to   ? Wound Properties Date First Assessed: 08/17/21 Time First Assessed: 1430 Wound Type: Laceration Location: Pretibial Location Orientation: Right;Anterior Wound  Description (Comments): R shin wound Present on Admission: Yes  ? Dressing Type Compression wrap   Medihoney, profore lite with cotton  ? Dressing Changed Changed   ? Dressing Status Old drainage   ? Dressing Change Frequency PRN   ? Site / Wound Assessment Yellow;Red;Pink   ? % Wound base Red or Granulating 80%   ? % Wound base Yellow/Fibrinous Exudate 20%   ? Peri-wound Assessment Intact   ? Margins Attached edges (approximated)   ? Drainage Amount Minimal   ? Drainage Description Serosanguineous   ? Treatment Cleansed;Debridement (Selective)   ? Selective Debridement - Location wound bed and periwound   ? Selective Debridement - Tools Used Forceps   ? Selective Debridement - Tissue Removed slough   ? Wound Therapy - Clinical Statement wound with continued granulation and approximation of borders.  More devitalized tissue removed to promote continued approximation.  Pt wtihout any pain with debridement.  Continued with medihoney following application to vaseline and lotion to perimeter.   ? Wound Therapy - Functional Problem List walking, standing   ? Factors Delaying/Impairing Wound Healing Diabetes Mellitus;Multiple medical problems   ? Hydrotherapy Plan Debridement;Dressing change;Electrical stimulation;Patient/family education;Pulsatile lavage with suction   ? Wound Therapy - Frequency 2X /  week   ? Wound Therapy - Current Recommendations PT   ? Wound Plan debride and dressing changes.  photograph and measure weekly.   ? Dressing  medihoney, 4x4, profore lite, netting   ? ?  ?  ? ?  ? ? ? ? ? ? ? ? ? ? ? ? PT Short Term Goals - 08/23/21 0944   ? ?  ? PT SHORT TERM GOAL #1  ? Title Patients wound will be free from slough.   ? Time 3   ? Period Weeks   ? Status On-going   ? Target Date 09/07/21   ? ?  ?  ? ?  ? ? ? ? PT Long Term Goals - 08/23/21 0944   ? ?  ? PT LONG TERM GOAL #1  ? Title Patient wound to be healed to reduce risk of infection.   ? Time 6   ? Period Weeks   ? Status On-going   ? Target Date  09/28/21   ?  ? PT LONG TERM GOAL #2  ? Title Patient will be able to return to all activities unrestricted for improved ability to perform work functions and participate with family.   ? Time 6   ? Period Weeks   ? Status On-going   ? Target Date 09/28/21   ? ?  ?  ? ?  ? ? ? ? ? ? ? ? ? ?Patient will benefit from skilled therapeutic intervention in order to improve the following deficits and impairments:    ? ?Visit Diagnosis: ?Wound of right lower extremity, initial encounter ? ?Other abnormalities of gait and mobility ? ? ? ? ?Problem List ?Patient Active Problem List  ? Diagnosis Date Noted  ? Impaired glucose tolerance 08/08/2021  ? GERD (gastroesophageal reflux disease) 08/07/2021  ? Hypokalemia 08/07/2021  ? Psoriatic arthritis (HCC) 08/07/2021  ? Insomnia 08/07/2021  ? PAF (paroxysmal atrial fibrillation) (HCC) 08/07/2021  ? Obesity, Class III, BMI 40-49.9 (morbid obesity) (HCC) 08/07/2021  ? Hypomagnesemia 08/07/2021  ? Post-traumatic wound infection   ? Cellulitis of right lower leg 08/06/2021  ? Asthma, chronic 08/06/2021  ? Type 2 diabetes mellitus (HCC) 08/18/2019  ? Coagulopathy (HCC) 08/18/2019  ? Long term current use of anticoagulant therapy 08/18/2019  ? Atrial fibrillation with RVR (HCC) 08/17/2019  ? COVID-19   ? Hypoxia   ? Immune deficiency disorder (HCC)   ? Empyema (HCC) 03/20/2014  ? Empyema of right pleural space (HCC) 03/18/2014  ? CAP (community acquired pneumonia) 03/14/2014  ? Hydronephrosis, left 03/14/2014  ? Back pain 03/14/2014  ? Hypertension   ? OSA on CPAP   ? Hyperlipemia   ? ?[place] Kae Heller, PTA/CLT, WTA ?[phone] ? ?Emeline Gins B, PTA ?09/07/2021, 1:53 PM ? ?[place] ?[facility] ?[address] ?[place], [place], [address] ?Phone: [phone]   Fax:  [phone] ? ?Name: Haygen Zebrowski ?MRN: [id number] ?Date of Birth: [birth year] ? ? ? ? ?

## 2021-09-09 ENCOUNTER — Encounter (HOSPITAL_COMMUNITY): Payer: Self-pay | Admitting: Physical Therapy

## 2021-09-09 ENCOUNTER — Ambulatory Visit (HOSPITAL_COMMUNITY): Payer: No Typology Code available for payment source | Admitting: Physical Therapy

## 2021-09-09 DIAGNOSIS — R2689 Other abnormalities of gait and mobility: Secondary | ICD-10-CM | POA: Diagnosis not present

## 2021-09-09 DIAGNOSIS — S81801A Unspecified open wound, right lower leg, initial encounter: Secondary | ICD-10-CM

## 2021-09-09 NOTE — Therapy (Signed)
[place] ?[facility] ?[address] ?[place], [place], [address] ?Phone: [phone]   Fax:  [phone] ? ?Wound Care Therapy ? ?Patient Details  ?Name: [name] [name] ?MRN: [id number] ?Date of Birth: [birth year] ?Referring Provider (PT): Catarina Hartshorn MD ? ? ?Encounter Date: 09/09/2021 ? ? PT End of Session - 09/09/21 1511   ? ? Visit Number 8   ? Number of Visits 12   ? Date for PT Re-Evaluation 09/28/21   ? Authorization Type BCBS (no auth, 60 VL)   ? Authorization - Visit Number 8   ? Authorization - Number of Visits 60   ? PT Start Time 1405   ? PT Stop Time 1440   ? PT Time Calculation (min) 35 min   ? Activity Tolerance Patient tolerated treatment well;No increased pain   ? Behavior During Therapy [facility] for tasks assessed/performed   ? ?  ?  ? ?  ? ? ?Past Medical History:  ?Diagnosis Date  ? Asthma   ? Atrial fibrillation (HCC)   ? Documented March 2021  ? Essential hypertension   ? History of nephrolithiasis   ? Hyperlipemia   ? Obesity   ? BMI 42.6  ? Psoriasis   ? Psoriatic arthritis (HCC)   ? Seasonal allergies   ? Sleep apnea   ? ? ?Past Surgical History:  ?Procedure Laterality Date  ? NOSE SURGERY    ? VIDEO ASSISTED THORACOSCOPY (VATS)/DECORTICATION Right 03/20/2014  ? Procedure: VIDEO ASSISTED THORACOSCOPY (VATS)/DECORTICATION;  Surgeon: Kerin Perna, MD;  Location: [facility] OR;  Service: Thoracic;  Laterality: Right;  ? ? ?There were no vitals filed for this visit. ? ? ? ? ? ? ? ? ? ? ? ? ? ? Wound Therapy - 09/09/21 0001   ? ? Subjective Leg feels alright   ? Patient and Family Stated Goals wound to heal   ? Date of Onset 07/20/21   ? Prior Treatments antibiotics, silvadene   ? Pain Score 0-No pain   ? Evaluation and Treatment Procedures Explained to Patient/Family Yes   ? Evaluation and Treatment Procedures agreed to   ? Wound Properties Date First Assessed: 08/17/21 Time First Assessed: 1430 Wound Type: Laceration Location: Pretibial Location Orientation: Right;Anterior Wound  Description (Comments): R shin wound Present on Admission: Yes  ? Dressing Type Compression wrap   Medihoney, profore lite with cotton  ? Dressing Changed Changed   ? Dressing Status Old drainage   ? Dressing Change Frequency PRN   ? Site / Wound Assessment Yellow;Red;Pink   ? % Wound base Red or Granulating 95%   ? % Wound base Yellow/Fibrinous Exudate 5%   ? Peri-wound Assessment Intact   ? Wound Length (cm) 3.7 cm   ? Wound Width (cm) 2 cm   ? Wound Depth (cm) 0.2 cm   ? Wound Volume (cm^3) 1.48 cm^3   ? Wound Surface Area (cm^2) 7.4 cm^2   ? Margins Attached edges (approximated)   ? Drainage Amount Minimal   ? Drainage Description Serosanguineous   ? Treatment Cleansed;Debridement (Selective)   ? Selective Debridement - Location wound bed and periwound   ? Selective Debridement - Tools Used Forceps   ? Selective Debridement - Tissue Removed slough   ? Wound Therapy - Clinical Statement Patient tolerates debridement well of slough in wound bed and most slough is able to be removed. Wound decreasing in depth but about the same in size. Continued with same dressing as wound healing well.   ?  Wound Therapy - Functional Problem List walking, standing   ? Factors Delaying/Impairing Wound Healing Diabetes Mellitus;Multiple medical problems   ? Hydrotherapy Plan Debridement;Dressing change;Electrical stimulation;Patient/family education;Pulsatile lavage with suction   ? Wound Therapy - Frequency 2X / week   ? Wound Therapy - Current Recommendations PT   ? Wound Plan debride and dressing changes.  photograph and measure weekly.   ? Dressing  medihoney, 4x4, profore lite, netting   ? ?  ?  ? ?  ? ? ? ? ? ? ? ? ? ? ? ? PT Short Term Goals - 08/23/21 0944   ? ?  ? PT SHORT TERM GOAL #1  ? Title Patients wound will be free from slough.   ? Time 3   ? Period Weeks   ? Status On-going   ? Target Date 09/07/21   ? ?  ?  ? ?  ? ? ? ? PT Long Term Goals - 08/23/21 0944   ? ?  ? PT LONG TERM GOAL #1  ? Title Patient wound to be  healed to reduce risk of infection.   ? Time 6   ? Period Weeks   ? Status On-going   ? Target Date 09/28/21   ?  ? PT LONG TERM GOAL #2  ? Title Patient will be able to return to all activities unrestricted for improved ability to perform work functions and participate with family.   ? Time 6   ? Period Weeks   ? Status On-going   ? Target Date 09/28/21   ? ?  ?  ? ?  ? ? ? ? ? ? ? ? Plan - 09/09/21 1511   ? ? Clinical Impression Statement see above   ? Personal Factors and Comorbidities Fitness;Comorbidity 3+   ? Comorbidities HTN, HLD, a fib, psoriatic arthritis, DM   ? Examination-Activity Limitations Locomotion Level;Transfers;Stand;Stairs;Squat   ? Examination-Participation Restrictions Occupation;Cleaning;Community Activity;Shop;Volunteer;Pincus Badder Work   ? Stability/Clinical Decision Making Evolving/Moderate complexity   ? Rehab Potential Good   ? PT Frequency 2x / week   ? PT Duration 6 weeks   ? PT Treatment/Interventions ADLs/Self Care Home Management;Gait training;Stair training;Functional mobility training;Manual techniques;Therapeutic activities;Therapeutic exercise;Patient/family education;Orthotic Fit/Training;Compression bandaging;Manual lymph drainage   ? PT Next Visit Plan debride and dressing changes to promote wound healing; measure wound   ? Consulted and Agree with Plan of Care Patient;Family member/caregiver   ? Family Member Consulted wife   ? ?  ?  ? ?  ? ? ?Patient will benefit from skilled therapeutic intervention in order to improve the following deficits and impairments:  Abnormal gait, Difficulty walking, Pain, Decreased activity tolerance, Increased edema, Decreased skin integrity ? ?Visit Diagnosis: ?Wound of right lower extremity, initial encounter ? ?Other abnormalities of gait and mobility ? ? ? ? ?Problem List ?Patient Active Problem List  ? Diagnosis Date Noted  ? Impaired glucose tolerance 08/08/2021  ? GERD (gastroesophageal reflux disease) 08/07/2021  ? Hypokalemia 08/07/2021  ?  Psoriatic arthritis (HCC) 08/07/2021  ? Insomnia 08/07/2021  ? PAF (paroxysmal atrial fibrillation) (HCC) 08/07/2021  ? Obesity, Class III, BMI 40-49.9 (morbid obesity) (HCC) 08/07/2021  ? Hypomagnesemia 08/07/2021  ? Post-traumatic wound infection   ? Cellulitis of right lower leg 08/06/2021  ? Asthma, chronic 08/06/2021  ? Type 2 diabetes mellitus (HCC) 08/18/2019  ? Coagulopathy (HCC) 08/18/2019  ? Long term current use of anticoagulant therapy 08/18/2019  ? Atrial fibrillation with RVR (HCC) 08/17/2019  ? COVID-19   ?  Hypoxia   ? Immune deficiency disorder (HCC)   ? Empyema (HCC) 03/20/2014  ? Empyema of right pleural space (HCC) 03/18/2014  ? CAP (community acquired pneumonia) 03/14/2014  ? Hydronephrosis, left 03/14/2014  ? Back pain 03/14/2014  ? Hypertension   ? OSA on CPAP   ? Hyperlipemia   ? ? ?3:18 PM, 09/09/21 ?Wyman SongsterAndrew S. Tymeshia Awan PT, DPT ?Physical Therapist at [facility] ?[facility] ? ? ?[place] ?[facility] ?[address] ?[place], [place], [address] ?Phone: [phone]   Fax:  [phone] ? ?Name: [name] [name] ?MRN: [id number] ?Date of Birth: 01/16/1961 ? ? ? ? ?

## 2021-09-14 ENCOUNTER — Ambulatory Visit (HOSPITAL_COMMUNITY): Payer: No Typology Code available for payment source | Attending: Internal Medicine | Admitting: Physical Therapy

## 2021-09-14 DIAGNOSIS — S81801A Unspecified open wound, right lower leg, initial encounter: Secondary | ICD-10-CM | POA: Diagnosis present

## 2021-09-14 DIAGNOSIS — R2689 Other abnormalities of gait and mobility: Secondary | ICD-10-CM

## 2021-09-14 NOTE — Therapy (Signed)
[place] ?[facility] ?[address] ?[place], [place], [address] ?Phone: [phone]   Fax:  [phone] ? ?Wound Care Therapy ? ?Patient Details  ?Name: [name] [name] ?MRN: [id number] ?Date of Birth: [birth year] ?Referring Provider (PT): Catarina Hartshorn MD ? ? ?Encounter Date: 09/14/2021 ? ? PT End of Session - 09/14/21 1604   ? ? Visit Number 9   ? Number of Visits 12   ? Date for PT Re-Evaluation 09/28/21   ? Authorization Type BCBS (no auth, 60 VL)   ? Authorization - Visit Number 9   ? Authorization - Number of Visits 60   ? PT Start Time 1409   ? PT Stop Time 1440   ? PT Time Calculation (min) 31 min   ? Activity Tolerance Patient tolerated treatment well;No increased pain   ? Behavior During Therapy [facility] for tasks assessed/performed   ? ?  ?  ? ?  ? ? ?Past Medical History:  ?Diagnosis Date  ? Asthma   ? Atrial fibrillation (HCC)   ? Documented March 2021  ? Essential hypertension   ? History of nephrolithiasis   ? Hyperlipemia   ? Obesity   ? BMI 42.6  ? Psoriasis   ? Psoriatic arthritis (HCC)   ? Seasonal allergies   ? Sleep apnea   ? ? ?Past Surgical History:  ?Procedure Laterality Date  ? NOSE SURGERY    ? VIDEO ASSISTED THORACOSCOPY (VATS)/DECORTICATION Right 03/20/2014  ? Procedure: VIDEO ASSISTED THORACOSCOPY (VATS)/DECORTICATION;  Surgeon: Kerin Perna, MD;  Location: [facility] OR;  Service: Thoracic;  Laterality: Right;  ? ? ?There were no vitals filed for this visit. ? ? ? ? ? ? ? ? ? ? ? ? ? ? Wound Therapy - 09/14/21 1610   ? ? Subjective pt states it does not bother him.   ? Patient and Family Stated Goals wound to heal   ? Date of Onset 07/20/21   ? Prior Treatments antibiotics, silvadene   ? Pain Score 0-No pain   ? Evaluation and Treatment Procedures Explained to Patient/Family Yes   ? Evaluation and Treatment Procedures agreed to   ? Wound Properties Date First Assessed: 08/17/21 Time First Assessed: 1430 Wound Type: Laceration Location: Pretibial Location Orientation:  Right;Anterior Wound Description (Comments): R shin wound Present on Admission: Yes  ? Dressing Type Compression wrap   Medihoney, profore lite with cotton  ? Dressing Changed Changed   ? Dressing Status Old drainage   ? Dressing Change Frequency PRN   ? Site / Wound Assessment Yellow;Red;Pink   ? % Wound base Red or Granulating 95%   ? % Wound base Yellow/Fibrinous Exudate 5%   ? Peri-wound Assessment Intact   ? Margins Attached edges (approximated)   ? Drainage Amount Minimal   ? Drainage Description Serosanguineous   ? Treatment Cleansed;Debridement (Selective)   ? Selective Debridement - Location wound bed and periwound   ? Selective Debridement - Tools Used Forceps   ? Selective Debridement - Tissue Removed slough   ? Wound Therapy - Clinical Statement Wound continues to approximate and fill in.  Was covered in slough but able to debride all away with only a sliver down the middle of the wound remaining.  changed dressing to xeroform this session due to granuation percentage.  Moisturzied LE well prior to reapplication of profore lite.   ? Wound Therapy - Functional Problem List walking, standing   ? Factors Delaying/Impairing Wound Healing Diabetes Mellitus;Multiple medical problems   ?  Hydrotherapy Plan Debridement;Dressing change;Electrical stimulation;Patient/family education;Pulsatile lavage with suction   ? Wound Therapy - Frequency 2X / week   ? Wound Therapy - Current Recommendations PT   ? Wound Plan debride and dressing changes.  photograph and measure weekly.   ? Dressing  xerform, 4x4, profore lite, netting   ? ?  ?  ? ?  ? ? ? ? ? ? ? ? ? ? ? ? PT Short Term Goals - 08/23/21 0944   ? ?  ? PT SHORT TERM GOAL #1  ? Title Patients wound will be free from slough.   ? Time 3   ? Period Weeks   ? Status On-going   ? Target Date 09/07/21   ? ?  ?  ? ?  ? ? ? ? PT Long Term Goals - 08/23/21 0944   ? ?  ? PT LONG TERM GOAL #1  ? Title Patient wound to be healed to reduce risk of infection.   ? Time 6   ?  Period Weeks   ? Status On-going   ? Target Date 09/28/21   ?  ? PT LONG TERM GOAL #2  ? Title Patient will be able to return to all activities unrestricted for improved ability to perform work functions and participate with family.   ? Time 6   ? Period Weeks   ? Status On-going   ? Target Date 09/28/21   ? ?  ?  ? ?  ? ? ? ? ? ? ? ? ? ?Patient will benefit from skilled therapeutic intervention in order to improve the following deficits and impairments:    ? ?Visit Diagnosis: ?Wound of right lower extremity, initial encounter ? ?Other abnormalities of gait and mobility ? ? ? ? ?Problem List ?Patient Active Problem List  ? Diagnosis Date Noted  ? Impaired glucose tolerance 08/08/2021  ? GERD (gastroesophageal reflux disease) 08/07/2021  ? Hypokalemia 08/07/2021  ? Psoriatic arthritis (HCC) 08/07/2021  ? Insomnia 08/07/2021  ? PAF (paroxysmal atrial fibrillation) (HCC) 08/07/2021  ? Obesity, Class III, BMI 40-49.9 (morbid obesity) (HCC) 08/07/2021  ? Hypomagnesemia 08/07/2021  ? Post-traumatic wound infection   ? Cellulitis of right lower leg 08/06/2021  ? Asthma, chronic 08/06/2021  ? Type 2 diabetes mellitus (HCC) 08/18/2019  ? Coagulopathy (HCC) 08/18/2019  ? Long term current use of anticoagulant therapy 08/18/2019  ? Atrial fibrillation with RVR (HCC) 08/17/2019  ? COVID-19   ? Hypoxia   ? Immune deficiency disorder (HCC)   ? Empyema (HCC) 03/20/2014  ? Empyema of right pleural space (HCC) 03/18/2014  ? CAP (community acquired pneumonia) 03/14/2014  ? Hydronephrosis, left 03/14/2014  ? Back pain 03/14/2014  ? Hypertension   ? OSA on CPAP   ? Hyperlipemia   ? ? ?Emeline Gins B, PTA ?09/14/2021, 4:13 PM ? ?[place] ?[facility] ?[address] ?[place], [place], [address] ?Phone: [phone]   Fax:  [phone] ? ?Name: [name] [name] ?MRN: [id number] ?Date of Birth: December 29, 1960 ? ? ? ? ?

## 2021-09-16 ENCOUNTER — Encounter (HOSPITAL_COMMUNITY): Payer: Self-pay | Admitting: Physical Therapy

## 2021-09-16 ENCOUNTER — Ambulatory Visit (HOSPITAL_COMMUNITY): Payer: No Typology Code available for payment source | Admitting: Physical Therapy

## 2021-09-16 DIAGNOSIS — R2689 Other abnormalities of gait and mobility: Secondary | ICD-10-CM

## 2021-09-16 DIAGNOSIS — S81801A Unspecified open wound, right lower leg, initial encounter: Secondary | ICD-10-CM

## 2021-09-16 NOTE — Therapy (Signed)
[place] ?[place] ?[address] ?[place], [place], [address] ?Phone: [phone]   Fax:  [phone] ? ?Wound Care Therapy ? ?Patient Details  ?Name: [name] [name] ?MRN: [id number] ?Date of Birth: May 16, 1961 ?Referring Provider (PT): Orson Eva MD ? ? ?Encounter Date: 09/16/2021 ? ?Progress Note  ? ?Reporting Period 08/17/21 to 09/16/21 ? ? See note below for Objective Data and Assessment of Progress/Goals  ? ? PT End of Session - 09/16/21 1358   ? ? Visit Number 10   ? Number of Visits 12   ? Date for PT Re-Evaluation 09/28/21   ? Authorization Type BCBS (no auth, 60 VL)   ? Authorization - Visit Number 10   ? Authorization - Number of Visits 60   ? Progress Note Due on Visit 20   ? PT Start Time 1400   ? PT Stop Time 1430   ? PT Time Calculation (min) 30 min   ? Activity Tolerance Patient tolerated treatment well;No increased pain   ? Behavior During Therapy [facility] for tasks assessed/performed   ? ?  ?  ? ?  ? ? ?Past Medical History:  ?Diagnosis Date  ? Asthma   ? Atrial fibrillation ([place])   ? Documented March 2021  ? Essential hypertension   ? History of nephrolithiasis   ? Hyperlipemia   ? Obesity   ? BMI 42.6  ? Psoriasis   ? Psoriatic arthritis ([place])   ? Seasonal allergies   ? Sleep apnea   ? ? ?Past Surgical History:  ?Procedure Laterality Date  ? NOSE SURGERY    ? VIDEO ASSISTED THORACOSCOPY (VATS)/DECORTICATION Right 03/20/2014  ? Procedure: VIDEO ASSISTED THORACOSCOPY (VATS)/DECORTICATION;  Surgeon: Ivin Poot, MD;  Location: [facility];  Service: Thoracic;  Laterality: Right;  ? ? ?There were no vitals filed for this visit. ? ? ? ? ? ? ? ? ? ? ? ? ? ? Wound Therapy - 09/16/21 0001   ? ? Subjective Leg is feeling.   ? Patient and Family Stated Goals wound to heal   ? Date of Onset 07/20/21   ? Prior Treatments antibiotics, silvadene   ? Pain Score 0-No pain   ? Evaluation and Treatment Procedures Explained to Patient/Family Yes   ? Evaluation and Treatment Procedures agreed to   ?  Wound Properties Date First Assessed: 08/17/21 Time First Assessed: 1430 Wound Type: Laceration Location: Pretibial Location Orientation: Right;Anterior Wound Description (Comments): R shin wound Present on Admission: Yes  ? Wound Image Images linked: 1   ? Dressing Type Compression wrap   Medihoney, profore lite with cotton  ? Dressing Changed Changed   ? Dressing Status Old drainage   ? Dressing Change Frequency PRN   ? Site / Wound Assessment Yellow;Red;Pink   ? % Wound base Red or Granulating 100%   ? Peri-wound Assessment Intact   ? Wound Length (cm) 2.6 cm   ? Wound Width (cm) 1.8 cm   ? Wound Surface Area (cm^2) 4.68 cm^2   ? Margins Attached edges (approximated)   ? Drainage Amount Minimal   ? Drainage Description Serosanguineous   ? Treatment Cleansed;Debridement (Selective)   ? Selective Debridement - Location wound bed and periwound   ? Selective Debridement - Tools Used Forceps   ? Selective Debridement - Tissue Removed slough   ? Wound Therapy - Clinical Statement Wound with large decrease in size from last sessions. Able to debride all sough from wound bed with all granulatiron tissue  present. small spot remains in middle with slight depth. Wound healing well. Patient has met 1 short term goal with no remaining slough. Remaining goals not met due to continued wound.   ? Wound Therapy - Functional Problem List walking, standing   ? Factors Delaying/Impairing Wound Healing Diabetes Mellitus;Multiple medical problems   ? Hydrotherapy Plan Debridement;Dressing change;Electrical stimulation;Patient/family education;Pulsatile lavage with suction   ? Wound Therapy - Frequency 2X / week   ? Wound Therapy - Current Recommendations PT   ? Wound Plan debride and dressing changes.  photograph and measure weekly.   ? Dressing  xerform, 4x4, profore lite, netting   ? ?  ?  ? ?  ? ? ? ? ? ? ? ? ? ? ? ? PT Short Term Goals - 08/23/21 0944   ? ?  ? PT SHORT TERM GOAL #1  ? Title Patients wound will be free from slough.    ? Time 3   ? Period Weeks   ? Status MET  ? Target Date 09/07/21   ? ?  ?  ? ?  ? ? ? ? PT Long Term Goals - 08/23/21 0944   ? ?  ? PT LONG TERM GOAL #1  ? Title Patient wound to be healed to reduce risk of infection.   ? Time 6   ? Period Weeks   ? Status On-going   ? Target Date 09/28/21   ?  ? PT LONG TERM GOAL #2  ? Title Patient will be able to return to all activities unrestricted for improved ability to perform work functions and participate with family.   ? Time 6   ? Period Weeks   ? Status On-going   ? Target Date 09/28/21   ? ?  ?  ? ?  ? ? ? ? ? ? ? ? ? ?Patient will benefit from skilled therapeutic intervention in order to improve the following deficits and impairments:    ? ?Visit Diagnosis: ?Wound of right lower extremity, initial encounter ? ?Other abnormalities of gait and mobility ? ? ? ? ?Problem List ?Patient Active Problem List  ? Diagnosis Date Noted  ? Impaired glucose tolerance 08/08/2021  ? GERD (gastroesophageal reflux disease) 08/07/2021  ? Hypokalemia 08/07/2021  ? Psoriatic arthritis ([place]) 08/07/2021  ? Insomnia 08/07/2021  ? PAF (paroxysmal atrial fibrillation) ([place]) 08/07/2021  ? Obesity, Class III, BMI 40-49.9 (morbid obesity) ([place]) 08/07/2021  ? Hypomagnesemia 08/07/2021  ? Post-traumatic wound infection   ? Cellulitis of right lower leg 08/06/2021  ? Asthma, chronic 08/06/2021  ? Type 2 diabetes mellitus ([place]) 08/18/2019  ? Coagulopathy ([place]) 08/18/2019  ? Long term current use of anticoagulant therapy 08/18/2019  ? Atrial fibrillation with RVR ([place]) 08/17/2019  ? COVID-19   ? Hypoxia   ? Immune deficiency disorder ([place])   ? Empyema ([place]) 03/20/2014  ? Empyema of right pleural space ([place]) 03/18/2014  ? CAP (community acquired pneumonia) 03/14/2014  ? Hydronephrosis, left 03/14/2014  ? Back pain 03/14/2014  ? Hypertension   ? OSA on CPAP   ? Hyperlipemia   ? ? ?Mearl Latin, PT ?09/16/2021, 2:41 PM ? ?[place] ?[place] ?[address] ?[place], [place], [address] ?Phone: [phone]   Fax:  [phone] ? ?Name: [name] [name] ?MRN: [id number] ?Date of Birth: [birth year] ? ? ? ? ?

## 2021-09-21 ENCOUNTER — Ambulatory Visit (HOSPITAL_COMMUNITY): Payer: No Typology Code available for payment source | Admitting: Physical Therapy

## 2021-09-21 ENCOUNTER — Encounter (HOSPITAL_COMMUNITY): Payer: Self-pay | Admitting: Physical Therapy

## 2021-09-21 DIAGNOSIS — S81801A Unspecified open wound, right lower leg, initial encounter: Secondary | ICD-10-CM

## 2021-09-21 DIAGNOSIS — R2689 Other abnormalities of gait and mobility: Secondary | ICD-10-CM

## 2021-09-21 NOTE — Therapy (Signed)
[facility] ?[address] ?[place], [place], [address] ?Phone: [phone]   Fax:  [phone] ? ?Wound Care Therapy ? ?Patient Details  ?Name: [name] [name] ?MRN: [id number] ?Date of Birth: Aug 20, 1960 ?Referring Provider (PT): Orson Eva MD ? ? ?Encounter Date: 09/21/2021 ? ? PT End of Session - 09/21/21 1359   ? ? Visit Number 11   ? Number of Visits 12   ? Date for PT Re-Evaluation 09/28/21   ? Authorization Type BCBS (no auth, 60 VL)   ? Authorization - Visit Number 11   ? Authorization - Number of Visits 60   ? Progress Note Due on Visit 20   ? PT Start Time 1320   ? PT Stop Time 1340   ? PT Time Calculation (min) 20 min   ? Activity Tolerance Patient tolerated treatment well;No increased pain   ? Behavior During Therapy [facility] for tasks assessed/performed   ? ?  ?  ? ?  ? ? ?Past Medical History:  ?Diagnosis Date  ? Asthma   ? Atrial fibrillation ([place])   ? Documented March 2021  ? Essential hypertension   ? History of nephrolithiasis   ? Hyperlipemia   ? Obesity   ? BMI 42.6  ? Psoriasis   ? Psoriatic arthritis ([place])   ? Seasonal allergies   ? Sleep apnea   ? ? ?Past Surgical History:  ?Procedure Laterality Date  ? NOSE SURGERY    ? VIDEO ASSISTED THORACOSCOPY (VATS)/DECORTICATION Right 03/20/2014  ? Procedure: VIDEO ASSISTED THORACOSCOPY (VATS)/DECORTICATION;  Surgeon: Ivin Poot, MD;  Location: [facility];  Service: Thoracic;  Laterality: Right;  ? ? ?There were no vitals filed for this visit. ? ? ? ? ? ? ? ? ? ? ? ? ? ? Wound Therapy - 09/21/21 1400   ? ? Subjective Workers Designer, multimedia present for visit today.  pt reports no pain or issues.   ? Patient and Family Stated Goals wound to heal   ? Date of Onset 07/20/21   ? Prior Treatments antibiotics, silvadene   ? Pain Score 0-No pain   ? Evaluation and Treatment Procedures Explained to Patient/Family Yes   ? Evaluation and Treatment Procedures agreed to   ? Wound Properties Date First Assessed: 08/17/21 Time First Assessed:  1430 Wound Type: Laceration Location: Pretibial Location Orientation: Right;Anterior Wound Description (Comments): R shin wound Present on Admission: Yes  ? Dressing Type Compression wrap   Medihoney, profore lite with cotton  ? Dressing Changed Changed   ? Dressing Status Old drainage   ? Dressing Change Frequency PRN   ? Site / Wound Assessment Yellow;Red;Pink   ? % Wound base Red or Granulating 100%   after debridement 98% before  ? Peri-wound Assessment Intact   ? Wound Length (cm) 1.5 cm   ? Wound Width (cm) 0.5 cm   ? Wound Surface Area (cm^2) 0.75 cm^2   ? Margins Attached edges (approximated)   ? Drainage Amount Scant   ? Drainage Description Serosanguineous   ? Treatment Cleansed;Debridement (Selective)   ? Selective Debridement - Location wound bed and periwound   ? Selective Debridement - Tools Used Forceps   ? Selective Debridement - Tissue Removed slough   ? Wound Therapy - Clinical Statement wound half the size of last week and no depth present at this point.  Had a little slought that was removed from surface upon dressing removal with return of 100% granulation.  Cleansed and moisturized  well.  conintued with xerform and profore lite.   ? Wound Therapy - Functional Problem List walking, standing   ? Factors Delaying/Impairing Wound Healing Diabetes Mellitus;Multiple medical problems   ? Hydrotherapy Plan Debridement;Dressing change;Electrical stimulation;Patient/family education;Pulsatile lavage with suction   ? Wound Therapy - Frequency 2X / week   ? Wound Therapy - Current Recommendations PT   ? Wound Plan debride and dressing changes.  photograph and measure weekly.  Possible D/C? Discontinue profore next session.   ? Dressing  xerform, 4x4, profore lite, netting   ? ?  ?  ? ?  ? ? ? ? ? ? ? ? ? ? ? ? PT Short Term Goals - 09/16/21 1441   ? ?  ? PT SHORT TERM GOAL #1  ? Title Patients wound will be free from slough.   ? Time 3   ? Period Weeks   ? Status Achieved   ? Target Date 09/07/21   ? ?  ?   ? ?  ? ? ? ? PT Long Term Goals - 08/23/21 0944   ? ?  ? PT LONG TERM GOAL #1  ? Title Patient wound to be healed to reduce risk of infection.   ? Time 6   ? Period Weeks   ? Status On-going   ? Target Date 09/28/21   ?  ? PT LONG TERM GOAL #2  ? Title Patient will be able to return to all activities unrestricted for improved ability to perform work functions and participate with family.   ? Time 6   ? Period Weeks   ? Status On-going   ? Target Date 09/28/21   ? ?  ?  ? ?  ? ? ? ? ? ? ? ? ? ?Patient will benefit from skilled therapeutic intervention in order to improve the following deficits and impairments:    ? ?Visit Diagnosis: ?Wound of right lower extremity, initial encounter ? ?Other abnormalities of gait and mobility ? ? ? ? ?Problem List ?Patient Active Problem List  ? Diagnosis Date Noted  ? Impaired glucose tolerance 08/08/2021  ? GERD (gastroesophageal reflux disease) 08/07/2021  ? Hypokalemia 08/07/2021  ? Psoriatic arthritis ([place]) 08/07/2021  ? Insomnia 08/07/2021  ? PAF (paroxysmal atrial fibrillation) ([place]) 08/07/2021  ? Obesity, Class III, BMI 40-49.9 (morbid obesity) ([place]) 08/07/2021  ? Hypomagnesemia 08/07/2021  ? Post-traumatic wound infection   ? Cellulitis of right lower leg 08/06/2021  ? Asthma, chronic 08/06/2021  ? Type 2 diabetes mellitus ([place]) 08/18/2019  ? Coagulopathy ([place]) 08/18/2019  ? Long term current use of anticoagulant therapy 08/18/2019  ? Atrial fibrillation with RVR ([place]) 08/17/2019  ? COVID-19   ? Hypoxia   ? Immune deficiency disorder ([place])   ? Empyema ([place]) 03/20/2014  ? Empyema of right pleural space ([place]) 03/18/2014  ? CAP (community acquired pneumonia) 03/14/2014  ? Hydronephrosis, left 03/14/2014  ? Back pain 03/14/2014  ? Hypertension   ? OSA on CPAP   ? Hyperlipemia   ? ?Domingue Coltrain Sula Soda, PTA/CLT, WTA ?[phone] ? ?Roseanne Reno B, PTA ?09/21/2021, 2:30 PM ? ?[facility] ?[address] ?[place], [place], [address] ?Phone:  [phone]   Fax:  [phone] ? ?Name: Tobin Rogalski ?MRN: [id number] ?Date of Birth: [birth year] ? ? ? ? ?

## 2021-09-23 ENCOUNTER — Ambulatory Visit (HOSPITAL_COMMUNITY): Payer: BC Managed Care – PPO | Attending: Family Medicine

## 2021-09-23 ENCOUNTER — Encounter (HOSPITAL_COMMUNITY): Payer: Self-pay

## 2021-09-23 DIAGNOSIS — S81801A Unspecified open wound, right lower leg, initial encounter: Secondary | ICD-10-CM

## 2021-09-23 DIAGNOSIS — R2689 Other abnormalities of gait and mobility: Secondary | ICD-10-CM | POA: Diagnosis present

## 2021-09-23 NOTE — Therapy (Signed)
[facility] ?[address] ?[place], [place], [address] ?Phone: [phone]   Fax:  [phone] ? ?Wound Care Therapy ? ?Patient Details  ?Name: [name] [name] ?MRN: [id number] ?Date of Birth: 09-05-1960 ?Referring Provider (PT): Orson Eva MD ? ? ?Encounter Date: 09/23/2021 ? ?PHYSICAL THERAPY DISCHARGE SUMMARY ? ?Visits from Start of Care: 12 ? ?Current functional level related to goals / functional outcomes: ?See below ?  ?Remaining deficits: ?See below ?  ?Education / Equipment: ?See below  ? ?Patient agrees to discharge. Patient goals were partially met. Patient is being discharged due to  wound being almost healed, d/c to self care. ? ?1:15 PM, 09/23/21 ?Mearl Latin PT, DPT ?Physical Therapist at [facility] ?[facility] ? ? ? PT End of Session - 09/23/21 1211   ? ? Visit Number 12   ? Number of Visits 12   ? Date for PT Re-Evaluation 09/28/21   ? Authorization Type BCBS (no auth, 60 VL)   ? Authorization - Visit Number 12   ? Authorization - Number of Visits 60   ? Progress Note Due on Visit 20   ? PT Start Time 1137   ? PT Stop Time 1200   ? PT Time Calculation (min) 23 min   ? Activity Tolerance Patient tolerated treatment well;No increased pain   ? Behavior During Therapy [facility] for tasks assessed/performed   ? ?  ?  ? ?  ? ? ?Past Medical History:  ?Diagnosis Date  ? Asthma   ? Atrial fibrillation ([place])   ? Documented March 2021  ? Essential hypertension   ? History of nephrolithiasis   ? Hyperlipemia   ? Obesity   ? BMI 42.6  ? Psoriasis   ? Psoriatic arthritis ([place])   ? Seasonal allergies   ? Sleep apnea   ? ? ?Past Surgical History:  ?Procedure Laterality Date  ? NOSE SURGERY    ? VIDEO ASSISTED THORACOSCOPY (VATS)/DECORTICATION Right 03/20/2014  ? Procedure: VIDEO ASSISTED THORACOSCOPY (VATS)/DECORTICATION;  Surgeon: Ivin Poot, MD;  Location: [facility];  Service: Thoracic;  Laterality: Right;  ? ? ?There were no vitals filed for this visit. ? ? ?  Subjective Assessment - 09/23/21 1203   ? ? Subjective Pt arrived with wife, smile on face and reports he's happy wiht progress.   ? Currently in Pain? No/denies   ? ?  ?  ? ?  ? ? ? ? ? ? ? ? ? ? ? ? Wound Therapy - 09/23/21 0001   ? ? Subjective Pt arrived with wife, smile on face and reports he's happy wiht progress.   ? Patient and Family Stated Goals wound to heal   ? Date of Onset 07/20/21   ? Prior Treatments antibiotics, silvadene   ? Pain Score 0-No pain   ? Evaluation and Treatment Procedures Explained to Patient/Family Yes   ? Evaluation and Treatment Procedures agreed to   ? Wound Properties Date First Assessed: 08/17/21 Time First Assessed: 1430 Wound Type: Laceration Location: Pretibial Location Orientation: Right;Anterior Wound Description (Comments): R shin wound Present on Admission: Yes  ? Wound Image Images linked: 1   ? Dressing Type Bismuth petroleum   vaseline perimeter, xeroform, 2x2, medipore tape  ? Dressing Changed Changed   ? Dressing Status Old drainage   ? Dressing Change Frequency PRN   ? Site / Wound Assessment Granulation tissue   ? % Wound base Red or Granulating 100%   ?  Peri-wound Assessment Intact   ? Wound Length (cm) 0.2 cm   ? Wound Width (cm) 0.3 cm   ? Wound Surface Area (cm^2) 0.06 cm^2   ? Margins Attached edges (approximated)   ? Drainage Amount None   ? Treatment Cleansed;Debridement (Selective)   ? Selective Debridement - Location wound bed and periwound   ? Selective Debridement - Tools Used --   cleaned with gauze  ? Selective Debridement - Tissue Removed dry skin perimeter   ? Wound Therapy - Clinical Statement Removal of dressings with vast improvement in granulation and no skilled intervention required.  LE cleansed and pt educated self care with verbalized understanding.   ? Wound Therapy - Functional Problem List walking, standing   ? Factors Delaying/Impairing Wound Healing Diabetes Mellitus;Multiple medical problems   ? Hydrotherapy Plan Debridement;Dressing  change;Electrical stimulation;Patient/family education;Pulsatile lavage with suction   ? Wound Therapy - Frequency 2X / week   ? Wound Therapy - Current Recommendations PT   ? Wound Plan DC to self care   ? Dressing  vaseline perimeter, xeroform, 2x2, medipore tape   ? ?  ?  ? ?  ? ? ? ?Assessment: ?Patient has met 1/1 short term goals and 0/2 long term goals. Remaining goals not met due to continued small wound and not returning to work at this time. Wound is very small at this point and fully granulated. Wound will likely heal soon and patient will then be able to return to all activities. Patient ready to transition to self care of wound with great progress made over last few weeks. Patient discharged from physical therapy at this time. ? ?1:19 PM, 09/23/21 ?Mearl Latin PT, DPT ?Physical Therapist at [facility] ?[facility] ? ? ? ? ? ? ? ? ? PT Short Term Goals - 09/16/21 1441   ? ?  ? PT SHORT TERM GOAL #1  ? Title Patients wound will be free from slough.   ? Time 3   ? Period Weeks   ? Status Achieved   ? Target Date 09/07/21   ? ?  ?  ? ?  ? ? ? ? PT Long Term Goals - 08/23/21 0944   ? ?  ? PT LONG TERM GOAL #1  ? Title Patient wound to be healed to reduce risk of infection.   ? Time 6   ? Period Weeks   ? Status On-going   ? Target Date 09/28/21   ?  ? PT LONG TERM GOAL #2  ? Title Patient will be able to return to all activities unrestricted for improved ability to perform work functions and participate with family.   ? Time 6   ? Period Weeks   ? Status On-going   ? Target Date 09/28/21   ? ?  ?  ? ?  ? ? ? ? ? ? ? ? ? ?Patient will benefit from skilled therapeutic intervention in order to improve the following deficits and impairments:    ? ?Visit Diagnosis: ?Wound of right lower extremity, initial encounter ? ?Other abnormalities of gait and mobility ? ? ? ? ?Problem List ?Patient Active Problem List  ? Diagnosis Date Noted  ? Impaired glucose tolerance 08/08/2021  ? GERD  (gastroesophageal reflux disease) 08/07/2021  ? Hypokalemia 08/07/2021  ? Psoriatic arthritis ([place]) 08/07/2021  ? Insomnia 08/07/2021  ? PAF (paroxysmal atrial fibrillation) ([place]) 08/07/2021  ? Obesity, Class III, BMI 40-49.9 (morbid obesity) ([place]) 08/07/2021  ? Hypomagnesemia 08/07/2021  ?  Post-traumatic wound infection   ? Cellulitis of right lower leg 08/06/2021  ? Asthma, chronic 08/06/2021  ? Type 2 diabetes mellitus ([place]) 08/18/2019  ? Coagulopathy ([place]) 08/18/2019  ? Long term current use of anticoagulant therapy 08/18/2019  ? Atrial fibrillation with RVR ([place]) 08/17/2019  ? COVID-19   ? Hypoxia   ? Immune deficiency disorder ([place])   ? Empyema ([place]) 03/20/2014  ? Empyema of right pleural space ([place]) 03/18/2014  ? CAP (community acquired pneumonia) 03/14/2014  ? Hydronephrosis, left 03/14/2014  ? Back pain 03/14/2014  ? Hypertension   ? OSA on CPAP   ? Hyperlipemia   ? ?Ihor Austin, LPTA/CLT; CBIS ?[phone] ? ?Aldona Lento, PTA ?09/23/2021, 12:13 PM ? ?[facility] ?[facility] ?[address] ?[place], [place], [address] ?Phone: [phone]   Fax:  [phone] ? ?Name: Vedh Ptacek ?MRN: [id number] ?Date of Birth: 1960/09/29 ? ? ? ? ?

## 2021-09-28 ENCOUNTER — Ambulatory Visit (HOSPITAL_COMMUNITY): Payer: BC Managed Care – PPO | Admitting: Physical Therapy

## 2021-09-30 ENCOUNTER — Ambulatory Visit (HOSPITAL_COMMUNITY): Payer: BC Managed Care – PPO | Admitting: Physical Therapy

## 2021-12-22 ENCOUNTER — Ambulatory Visit (HOSPITAL_COMMUNITY): Payer: BC Managed Care – PPO | Attending: Pediatrics | Admitting: Physical Therapy

## 2021-12-22 DIAGNOSIS — S81832D Puncture wound without foreign body, left lower leg, subsequent encounter: Secondary | ICD-10-CM | POA: Insufficient documentation

## 2021-12-22 DIAGNOSIS — R2689 Other abnormalities of gait and mobility: Secondary | ICD-10-CM | POA: Insufficient documentation

## 2021-12-22 DIAGNOSIS — S81801A Unspecified open wound, right lower leg, initial encounter: Secondary | ICD-10-CM | POA: Diagnosis present

## 2021-12-22 NOTE — Therapy (Signed)
[facility] [address] [place], [place], [address] Phone: [phone]   Fax:  [phone]  Wound Care Evaluation  Patient Details  Name: [name] [name] MRN: [id number] Date of Birth: 1960-12-14 Referring Provider (PT): [name] [name]   Encounter Date: 12/22/2021   PT End of Session - 12/22/21 0846     Visit Number 1    Number of Visits 8    Date for PT Re-Evaluation 01/21/22    Authorization Type BCBS (no auth, 60 VL)    Authorization - Visit Number 13    Authorization - Number of Visits 60    Progress Note Due on Visit 10    PT Start Time 0825    PT Stop Time 0845    PT Time Calculation (min) 20 min    Activity Tolerance Patient tolerated treatment well;No increased pain    Behavior During Therapy [facility] for tasks assessed/performed             Past Medical History:  Diagnosis Date   Asthma    Atrial fibrillation [facility])    Documented March 2021   Essential hypertension    History of nephrolithiasis    Hyperlipemia    Obesity    BMI 42.6   Psoriasis    Psoriatic arthritis ([facility])    Seasonal allergies    Sleep apnea     Past Surgical History:  Procedure Laterality Date   NOSE SURGERY     VIDEO ASSISTED THORACOSCOPY (VATS)/DECORTICATION Right 03/20/2014   Procedure: VIDEO ASSISTED THORACOSCOPY (VATS)/DECORTICATION;  Surgeon: [name] Perna, MD;  Location: [facility] OR;  Service: Thoracic;  Laterality: Right;    There were no vitals filed for this visit.     [facility] PT Assessment - 12/22/21 0001       Assessment   Medical Diagnosis puncture wound of left leg    Referring Provider (PT) [name] [name]    Onset Date/Surgical Date 12/16/21    Prior Therapy self care with honey      Precautions   Precautions --   cellulitis( pt has had cellulitis in the past due to leg wound)     Restrictions   Weight Bearing Restrictions No      Balance Screen   Has the patient fallen in the past 6 months No    Has the patient had a decrease  in activity level because of a fear of falling?  No    Is the patient reluctant to leave their home because of a fear of falling?  No      Prior Function   Level of Independence Independent             Wound Therapy - 12/22/21 0001     Subjective PT states that he was at work last Thursday and a piece of metal pierced his skin.  He has been taking care of it on his own but it does not seem to be improving.  He had almost the same thing happen last year and ended up in the hospital with cellulitis.  He did not want the wound to get out of control therefore he asked his MD to refer him to therapy.    Patient and Family Stated Goals wound to heal    Date of Onset 12/16/21    Prior Treatments self care with honey    Pain Score 2     Evaluation and Treatment Procedures Explained to Patient/Family Yes    Evaluation and Treatment Procedures  agreed to    Wound Properties Date First Assessed: 12/22/21 Time First Assessed: 0840 Wound Type: Puncture Location: Leg Location Orientation: Left;Lower;Lateral Wound Description (Comments): dry and pale Present on Admission: Yes   Dressing Type Other (Comment)    Dressing Changed Changed    Dressing Status Old drainage    Dressing Change Frequency PRN    Site / Wound Assessment Dry;Pale    % Wound base Red or Granulating 10%    % Wound base Other/Granulation Tissue (Comment) 90%    Peri-wound Assessment Intact;Edema;Hemosiderin    Wound Length (cm) 3 cm    Wound Width (cm) 4.7 cm    Wound Depth (cm) --   in the center area where skin was punctured at least 2.0 cm   Wound Surface Area (cm^2) 14.1 cm^2    Undermining (cm) lateral aspect of wound area where skin was punctured(in the center of the wound) undermines inferiorly at least 2.0 cm    Margins Attached edges (approximated)    Drainage Amount Minimal    Treatment Cleansed;Debridement (Selective);Other (Comment)   profore lite compression bandaging   Selective Debridement - Location wound bed     Selective Debridement - Tools Used Forceps    Selective Debridement - Tissue Removed dead skin, slough    Wound Therapy - Clinical Statement Pt states that he hit a piece of metal last Thursday and has been self treating but the wound does not seem to be improving.  He is using honey on the wound.  The LE has noted swelling, pt states he had been wearing 15-20 mm hg due to the fact that the 20-30 mmhg was bruising him.  He spoke to his MD who stated some of the medication that he is taking is causing him to have thin skin as well as bruising easily.  He is not having much pain, 2/10, but is concerned due to being hospitalized late last year for cellulitis from a simular incident.  Evaluation demonstrates a non healing wound that has a significant depth and undermining in the center of the wound.  [name] [name] will benefit from skilled PT for cleansing debridement and creating a healing enviornment for his wound.    Wound Therapy - Functional Problem List walking, standing    Factors Delaying/Impairing Wound Healing Diabetes Mellitus;Multiple medical problems    Hydrotherapy Plan Debridement;Dressing change;Electrical stimulation;Patient/family education;Pulsatile lavage with suction    Wound Therapy - Frequency --   1-2 x a week   Wound Therapy - Current Recommendations PT    Wound Plan Cleanse debridement and compression dressing .    Dressing  Medihoney , 2x2 and kling to wound followed by profore lite compression dressing.                   Objective measurements completed on examination: See above findings.               PT Short Term Goals - 12/22/21 0906       PT SHORT TERM GOAL #1   Title Patients wound will be free from slough.    Time 2    Period Weeks    Status New    Target Date 01/05/22      PT SHORT TERM GOAL #2   Title Wounds undermining and depth to be 1 cm or less to show healing.    Time 2    Period Weeks    Status New  PT Long  Term Goals - 12/22/21 0907       PT LONG TERM GOAL #1   Title Wound to be 100% granulated    Time 4    Period Weeks    Status New    Target Date 01/19/22      PT LONG TERM GOAL #2   Title Wound to have no undermining or depth to allow pt to feel confident in completing self care to wound.    Time 4    Period Weeks    Status New                  Plan - 12/22/21 0909     Clinical Impression Statement see above    Personal Factors and Comorbidities Comorbidity 3+;Fitness    Comorbidities DM, psoriatic arthritis, nephrolithasis    Examination-Activity Limitations Bathing;Dressing    Examination-Participation Restrictions Occupation    Stability/Clinical Decision Making Evolving/Moderate complexity    Clinical Decision Making Low    Rehab Potential Good    PT Frequency 2x / week    PT Duration 4 weeks    PT Treatment/Interventions Compression bandaging;Patient/family education;Other (comment)   debridement   PT Next Visit Plan continue to assess wound for proper dressing change, debride as needed.    PT Home Exercise Plan N/A             Patient will benefit from skilled therapeutic intervention in order to improve the following deficits and impairments:  Pain, Decreased skin integrity  Visit Diagnosis: Puncture wound of left lower leg with complication, subsequent encounter    Problem List Patient Active Problem List   Diagnosis Date Noted   Impaired glucose tolerance 08/08/2021   GERD (gastroesophageal reflux disease) 08/07/2021   Hypokalemia 08/07/2021   Psoriatic arthritis (HCC) 08/07/2021   Insomnia 08/07/2021   PAF (paroxysmal atrial fibrillation) (HCC) 08/07/2021   Obesity, Class III, BMI 40-49.9 (morbid obesity) (HCC) 08/07/2021   Hypomagnesemia 08/07/2021   Post-traumatic wound infection    Cellulitis of right lower leg 08/06/2021   Asthma, chronic 08/06/2021   Type 2 diabetes mellitus (HCC) 08/18/2019   Coagulopathy (HCC) 08/18/2019   Long  term current use of anticoagulant therapy 08/18/2019   Atrial fibrillation with RVR (HCC) 08/17/2019   COVID-19    Hypoxia    Immune deficiency disorder (HCC)    Empyema (HCC) 03/20/2014   Empyema of right pleural space (HCC) 03/18/2014   CAP (community acquired pneumonia) 03/14/2014   Hydronephrosis, left 03/14/2014   Back pain 03/14/2014   Hypertension    OSA on CPAP    Hyperlipemia    Virgina Organ, PT CLT [phone]  12/22/2021, 9:13 AM  [facility] [address] [place], [place], [address] Phone: [phone]   Fax:  [phone]  Name: [name] [name] MRN: [id number] Date of Birth: 1961/03/10

## 2021-12-27 ENCOUNTER — Ambulatory Visit (HOSPITAL_COMMUNITY): Payer: BC Managed Care – PPO | Admitting: Physical Therapy

## 2021-12-27 DIAGNOSIS — R2689 Other abnormalities of gait and mobility: Secondary | ICD-10-CM

## 2021-12-27 DIAGNOSIS — S81801A Unspecified open wound, right lower leg, initial encounter: Secondary | ICD-10-CM

## 2021-12-27 DIAGNOSIS — S81832D Puncture wound without foreign body, left lower leg, subsequent encounter: Secondary | ICD-10-CM | POA: Diagnosis not present

## 2021-12-27 NOTE — Therapy (Signed)
[facility] [address] [place], [place], [address] Phone: [phone]   Fax:  [phone]  Wound Care Therapy  Patient Details  Name: [name] [name] MRN: [id number] Date of Birth: 06-Apr-1961 Referring Provider (PT): Napoleon Form   Encounter Date: 12/27/2021   PT End of Session - 12/27/21 1049     Visit Number 2    Number of Visits 8    Date for PT Re-Evaluation 01/21/22    Authorization Type BCBS (no auth, 60 VL)    Authorization - Visit Number 14    Authorization - Number of Visits 60    Progress Note Due on Visit 10    PT Start Time 0820    PT Stop Time 0847    PT Time Calculation (min) 27 min    Activity Tolerance Patient tolerated treatment well;No increased pain    Behavior During Therapy [facility] for tasks assessed/performed             Past Medical History:  Diagnosis Date   Asthma    Atrial fibrillation [facility])    Documented March 2021   Essential hypertension    History of nephrolithiasis    Hyperlipemia    Obesity    BMI 42.6   Psoriasis    Psoriatic arthritis (HCC)    Seasonal allergies    Sleep apnea     Past Surgical History:  Procedure Laterality Date   NOSE SURGERY     VIDEO ASSISTED THORACOSCOPY (VATS)/DECORTICATION Right 03/20/2014   Procedure: VIDEO ASSISTED THORACOSCOPY (VATS)/DECORTICATION;  Surgeon: Kerin Perna, MD;  Location: [facility] OR;  Service: Thoracic;  Laterality: Right;    There were no vitals filed for this visit.               Wound Therapy - 12/27/21 1052     Subjective pt states he went to [facility] and they removed his bandage.  States what they put on him would not stay up so he rewrapped it with ACE bandage.  Sttates he currently does not have pain.    Patient and Family Stated Goals wound to heal    Date of Onset 12/16/21    Prior Treatments self care with honey    Pain Score 0-No pain    Evaluation and Treatment Procedures Explained to Patient/Family Yes    Evaluation and  Treatment Procedures agreed to    Wound Properties Date First Assessed: 12/22/21 Time First Assessed: 0840 Wound Type: Puncture Location: Leg Location Orientation: Left;Lower;Lateral Wound Description (Comments): dry and pale Present on Admission: Yes   Dressing Type Other (Comment)    Dressing Changed Changed    Dressing Status Old drainage    Dressing Change Frequency PRN    Site / Wound Assessment Dry;Pale    % Wound base Red or Granulating 50%    % Wound base Yellow/Fibrinous Exudate 50%    Peri-wound Assessment Intact;Edema;Hemosiderin    Undermining (cm) none found    Margins Attached edges (approximated)    Drainage Amount Minimal    Drainage Description Serosanguineous    Treatment Cleansed;Debridement (Selective)    Selective Debridement - Location wound bed    Selective Debridement - Tools Used Forceps    Selective Debridement - Tissue Removed dead skin, slough    Wound Therapy - Clinical Statement Noted improvement in wound today without erythema borders or symptoms of infection.  Cleansed well and debrided borders via sharps.  Adherent slough in central wound but no longer with tunnel or  undermined areas.  Continued with medihoney gauze and use of profore lite compression.    Wound Therapy - Functional Problem List walking, standing    Factors Delaying/Impairing Wound Healing Diabetes Mellitus;Multiple medical problems    Hydrotherapy Plan Debridement;Dressing change;Electrical stimulation;Patient/family education;Pulsatile lavage with suction    Wound Therapy - Frequency --   1-2 x a week   Wound Therapy - Current Recommendations PT    Wound Plan Cleanse debridement and compression dressing .    Dressing  Medihoney , 2x2 and kling to wound followed by profore lite compression dressing.                       PT Short Term Goals - 12/22/21 0906       PT SHORT TERM GOAL #1   Title Patients wound will be free from slough.    Time 2    Period Weeks    Status  New    Target Date 01/05/22      PT SHORT TERM GOAL #2   Title Wounds undermining and depth to be 1 cm or less to show healing.    Time 2    Period Weeks    Status New               PT Long Term Goals - 12/22/21 0907       PT LONG TERM GOAL #1   Title Wound to be 100% granulated    Time 4    Period Weeks    Status New    Target Date 01/19/22      PT LONG TERM GOAL #2   Title Wound to have no undermining or depth to allow pt to feel confident in completing self care to wound.    Time 4    Period Weeks    Status New                    Patient will benefit from skilled therapeutic intervention in order to improve the following deficits and impairments:     Visit Diagnosis: Puncture wound of left lower leg with complication, subsequent encounter  Wound of right lower extremity, initial encounter  Other abnormalities of gait and mobility     Problem List Patient Active Problem List   Diagnosis Date Noted   Impaired glucose tolerance 08/08/2021   GERD (gastroesophageal reflux disease) 08/07/2021   Hypokalemia 08/07/2021   Psoriatic arthritis (HCC) 08/07/2021   Insomnia 08/07/2021   PAF (paroxysmal atrial fibrillation) (HCC) 08/07/2021   Obesity, Class III, BMI 40-49.9 (morbid obesity) (HCC) 08/07/2021   Hypomagnesemia 08/07/2021   Post-traumatic wound infection    Cellulitis of right lower leg 08/06/2021   Asthma, chronic 08/06/2021   Type 2 diabetes mellitus (HCC) 08/18/2019   Coagulopathy (HCC) 08/18/2019   Long term current use of anticoagulant therapy 08/18/2019   Atrial fibrillation with RVR (HCC) 08/17/2019   COVID-19    Hypoxia    Immune deficiency disorder (HCC)    Empyema (HCC) 03/20/2014   Empyema of right pleural space (HCC) 03/18/2014   CAP (community acquired pneumonia) 03/14/2014   Hydronephrosis, left 03/14/2014   Back pain 03/14/2014   Hypertension    OSA on CPAP    Hyperlipemia    Lurena Nida, PTA/CLT [facility]  Outpatient Rehabitation [facility] Ph: [phone]  Samuel Bouche 12/27/2021, 10:59 AM   [facility] [address] [place], [place], [address] Phone:  [phone]   Fax:  [phone]  Name: Cartavious Willmann MRN: [id number] Date of Birth: [birth year]

## 2022-01-04 ENCOUNTER — Ambulatory Visit (HOSPITAL_COMMUNITY): Payer: BC Managed Care – PPO | Admitting: Physical Therapy

## 2022-01-04 DIAGNOSIS — S81801A Unspecified open wound, right lower leg, initial encounter: Secondary | ICD-10-CM

## 2022-01-04 DIAGNOSIS — S81832D Puncture wound without foreign body, left lower leg, subsequent encounter: Secondary | ICD-10-CM | POA: Diagnosis not present

## 2022-01-04 NOTE — Therapy (Signed)
[place], [place], [address] Phone: [phone]   Fax:  [phone]  Wound Care Therapy  Patient Details  Name: [name] [name] MRN: [id number] Date of Birth: Mar 10, 1961 Referring Provider (PT): Arline Asp   Encounter Date: 01/04/2022   PT End of Session - 01/04/22 1000     Visit Number 3    Number of Visits 8    Date for PT Re-Evaluation 01/21/22    Authorization Type BCBS (no auth, 60 VL)    Authorization - Visit Number 14    Authorization - Number of Visits 60    Progress Note Due on Visit 10    PT Start Time 0915    PT Stop Time 0950    PT Time Calculation (min) 35 min    Activity Tolerance Patient tolerated treatment well;No increased pain    Behavior During Therapy [facility] for tasks assessed/performed             Past Medical History:  Diagnosis Date   Asthma    Atrial fibrillation [facility])    Documented March 2021   Essential hypertension    History of nephrolithiasis    Hyperlipemia    Obesity    BMI 42.6   Psoriasis    Psoriatic arthritis ([place])    Seasonal allergies    Sleep apnea     Past Surgical History:  Procedure Laterality Date   NOSE SURGERY     VIDEO ASSISTED THORACOSCOPY (VATS)/DECORTICATION Right 03/20/2014   Procedure: VIDEO ASSISTED THORACOSCOPY (VATS)/DECORTICATION;  Surgeon: Ivin Poot, MD;  Location: [facility];  Service: Thoracic;  Laterality: Right;    There were no vitals filed for this visit.               Wound Therapy - 01/04/22 0001     Subjective PT states that he is done with his antibiotic.  States his wound hurts a bit.    Patient and Family Stated Goals wound to heal    Date of Onset 12/16/21    Prior Treatments self care with honey    Pain Score 2     Evaluation and Treatment Procedures Explained to Patient/Family Yes    Evaluation and Treatment Procedures agreed to    Wound Properties Date First Assessed: 12/22/21 Time First Assessed: 0840 Wound  Type: Puncture Location: Leg Location Orientation: Left;Lower;Lateral Wound Description (Comments): dry and pale Present on Admission: Yes   Dressing Type Compression wrap    Dressing Changed Changed    Dressing Status Old drainage    Dressing Change Frequency PRN    Site / Wound Assessment Friable;Bleeding;Pale;Granulation tissue    % Wound base Red or Granulating 65%    % Wound base Yellow/Fibrinous Exudate 35%    Wound Depth (cm) --   no longer able to find depth but center of wound where there was depth is spongy.   Drainage Amount Moderate    Drainage Description No odor;Purulent    Treatment Cleansed;Debridement (Selective)    Selective Debridement - Location wound bed    Selective Debridement - Tools Used Forceps    Selective Debridement - Tissue Removed slough and devitalized tissue.    Wound Therapy - Clinical Statement Upon removal of dressing pt had significant purulent drainage which was easily removed.  PT has improved granulation, however, the center of the wound is mushy which we will keep an eye on.    Wound Therapy - Functional Problem List walking, standing  Factors Delaying/Impairing Wound Healing Diabetes Mellitus;Multiple medical problems    Hydrotherapy Plan Debridement;Dressing change;Electrical stimulation;Patient/family education;Pulsatile lavage with suction    Wound Therapy - Frequency --   1-2 x a week   Wound Therapy - Current Recommendations PT    Wound Plan cleanse, moisturized and debrided    Dressing  medihoney, 4x4 and profore lite.                       PT Short Term Goals - 01/04/22 0959       PT SHORT TERM GOAL #1   Title Patients wound will be free from slough.    Time 2    Period Weeks    Status On-going    Target Date 01/05/22      PT SHORT TERM GOAL #2   Title Wounds undermining and depth to be 1 cm or less to show healing.    Time 2    Period Weeks    Status Achieved               PT Long Term Goals - 01/04/22  0959       PT LONG TERM GOAL #1   Title Wound to be 100% granulated    Time 4    Period Weeks    Status On-going    Target Date 01/19/22      PT LONG TERM GOAL #2   Title Wound to have no undermining or depth to allow pt to feel confident in completing self care to wound.    Time 4    Period Weeks    Status On-going                   Plan - 01/04/22 1000     Clinical Impression Statement see above    Personal Factors and Comorbidities Comorbidity 3+;Fitness    Comorbidities DM, psoriatic arthritis, nephrolithasis    Examination-Activity Limitations Bathing;Dressing    Examination-Participation Restrictions Occupation    Stability/Clinical Decision Making Evolving/Moderate complexity    Rehab Potential Good    PT Frequency 2x / week    PT Duration 4 weeks    PT Treatment/Interventions Compression bandaging;Patient/family education;Other (comment)   debridement   PT Next Visit Plan continue to assess wound for proper dressing change, debride as needed.    PT Home Exercise Plan N/A             Patient will benefit from skilled therapeutic intervention in order to improve the following deficits and impairments:  Pain, Decreased skin integrity  Visit Diagnosis: Puncture wound of left lower leg with complication, subsequent encounter  Wound of right lower extremity, initial encounter     Problem List Patient Active Problem List   Diagnosis Date Noted   Impaired glucose tolerance 08/08/2021   GERD (gastroesophageal reflux disease) 08/07/2021   Hypokalemia 08/07/2021   Psoriatic arthritis (HCC) 08/07/2021   Insomnia 08/07/2021   PAF (paroxysmal atrial fibrillation) (HCC) 08/07/2021   Obesity, Class III, BMI 40-49.9 (morbid obesity) (HCC) 08/07/2021   Hypomagnesemia 08/07/2021   Post-traumatic wound infection    Cellulitis of right lower leg 08/06/2021   Asthma, chronic 08/06/2021   Type 2 diabetes mellitus (HCC) 08/18/2019   Coagulopathy (HCC)  08/18/2019   Long term current use of anticoagulant therapy 08/18/2019   Atrial fibrillation with RVR (HCC) 08/17/2019   COVID-19    Hypoxia    Immune deficiency disorder (HCC)    Empyema (HCC) 03/20/2014  Empyema of right pleural space (HCC) 03/18/2014   CAP (community acquired pneumonia) 03/14/2014   Hydronephrosis, left 03/14/2014   Back pain 03/14/2014   Hypertension    OSA on CPAP    Hyperlipemia    Virgina Organ, PT CLT [phone]  01/04/2022, 10:01 AM  [facility] [address] [place], [place], [address] Phone: [phone]   Fax:  [phone]  Name: Brigham Cobbins MRN: [id number] Date of Birth: 1960/11/30

## 2022-01-06 ENCOUNTER — Ambulatory Visit (HOSPITAL_COMMUNITY): Payer: BC Managed Care – PPO | Admitting: Physical Therapy

## 2022-01-06 ENCOUNTER — Encounter (HOSPITAL_COMMUNITY): Payer: Self-pay | Admitting: Physical Therapy

## 2022-01-06 DIAGNOSIS — S81832D Puncture wound without foreign body, left lower leg, subsequent encounter: Secondary | ICD-10-CM

## 2022-01-06 DIAGNOSIS — R2689 Other abnormalities of gait and mobility: Secondary | ICD-10-CM

## 2022-01-06 NOTE — Therapy (Signed)
[facility] [address] [place], [place], [address] Phone: [phone]   Fax:  [phone]  Wound Care Therapy  Patient Details  Name: [name] [name] MRN: [id number] Date of Birth: 09/21/1960 Referring Provider (PT): Napoleon Form   Encounter Date: 01/06/2022   PT End of Session - 01/06/22 1027     Visit Number 4    Number of Visits 8    Date for PT Re-Evaluation 01/21/22    Authorization Type BCBS (no auth, 60 VL)    Authorization - Visit Number 15    Authorization - Number of Visits 60    Progress Note Due on Visit 10    PT Start Time 1028    PT Stop Time 1058    PT Time Calculation (min) 30 min    Activity Tolerance Patient tolerated treatment well;No increased pain    Behavior During Therapy [facility] for tasks assessed/performed             Past Medical History:  Diagnosis Date   Asthma    Atrial fibrillation [facility])    Documented March 2021   Essential hypertension    History of nephrolithiasis    Hyperlipemia    Obesity    BMI 42.6   Psoriasis    Psoriatic arthritis (HCC)    Seasonal allergies    Sleep apnea     Past Surgical History:  Procedure Laterality Date   NOSE SURGERY     VIDEO ASSISTED THORACOSCOPY (VATS)/DECORTICATION Right 03/20/2014   Procedure: VIDEO ASSISTED THORACOSCOPY (VATS)/DECORTICATION;  Surgeon: Kerin Perna, MD;  Location: [facility] OR;  Service: Thoracic;  Laterality: Right;    There were no vitals filed for this visit.               Wound Therapy - 01/06/22 0001     Subjective PT his leg is doing alright.    Patient and Family Stated Goals wound to heal    Date of Onset 12/16/21    Prior Treatments self care with honey    Pain Score 2     Evaluation and Treatment Procedures Explained to Patient/Family Yes    Evaluation and Treatment Procedures agreed to    Wound Properties Date First Assessed: 12/22/21 Time First Assessed: 0840 Wound Type: Puncture Location: Leg Location  Orientation: Left;Lower;Lateral Wound Description (Comments): dry and pale Present on Admission: Yes   Dressing Type Compression wrap    Dressing Changed Changed    Dressing Status Old drainage    Dressing Change Frequency PRN    Site / Wound Assessment Friable;Bleeding;Pale;Granulation tissue    % Wound base Red or Granulating 65%    % Wound base Yellow/Fibrinous Exudate 35%    Peri-wound Assessment Intact;Edema;Hemosiderin    Margins Attached edges (approximated)    Drainage Amount Minimal    Drainage Description Serous    Treatment Cleansed;Debridement (Selective)    Selective Debridement - Location wound bed    Selective Debridement - Tools Used Forceps    Selective Debridement - Tissue Removed slough and devitalized tissue.    Wound Therapy - Clinical Statement Patient without purulent drainage today. increased edema distal to wound which may be from dressing sliding down leg some, no warmth present. Tolerates debridment well but some adherent slough remains. Continued with medihoney to wound bed.    Wound Therapy - Functional Problem List walking, standing    Factors Delaying/Impairing Wound Healing Diabetes Mellitus;Multiple medical problems    Hydrotherapy Plan Debridement;Dressing change;Electrical stimulation;Patient/family education;Pulsatile lavage  with suction    Wound Therapy - Frequency --   1-2 x a week   Wound Therapy - Current Recommendations PT    Wound Plan cleanse, moisturized and debrided    Dressing  medihoney, 4x4 and profore lite.                       PT Short Term Goals - 01/04/22 0959       PT SHORT TERM GOAL #1   Title Patients wound will be free from slough.    Time 2    Period Weeks    Status On-going    Target Date 01/05/22      PT SHORT TERM GOAL #2   Title Wounds undermining and depth to be 1 cm or less to show healing.    Time 2    Period Weeks    Status Achieved               PT Long Term Goals - 01/04/22 0959        PT LONG TERM GOAL #1   Title Wound to be 100% granulated    Time 4    Period Weeks    Status On-going    Target Date 01/19/22      PT LONG TERM GOAL #2   Title Wound to have no undermining or depth to allow pt to feel confident in completing self care to wound.    Time 4    Period Weeks    Status On-going                   Plan - 01/06/22 1028     Clinical Impression Statement see above    Personal Factors and Comorbidities Comorbidity 3+;Fitness    Comorbidities DM, psoriatic arthritis, nephrolithasis    Examination-Activity Limitations Bathing;Dressing    Examination-Participation Restrictions Occupation    Stability/Clinical Decision Making Evolving/Moderate complexity    Rehab Potential Good    PT Frequency 2x / week    PT Duration 4 weeks    PT Treatment/Interventions Compression bandaging;Patient/family education;Other (comment)   debridement   PT Next Visit Plan continue to assess wound for proper dressing change, debride as needed.    PT Home Exercise Plan N/A             Patient will benefit from skilled therapeutic intervention in order to improve the following deficits and impairments:  Pain, Decreased skin integrity  Visit Diagnosis: Puncture wound of left lower leg with complication, subsequent encounter  Other abnormalities of gait and mobility     Problem List Patient Active Problem List   Diagnosis Date Noted   Impaired glucose tolerance 08/08/2021   GERD (gastroesophageal reflux disease) 08/07/2021   Hypokalemia 08/07/2021   Psoriatic arthritis (HCC) 08/07/2021   Insomnia 08/07/2021   PAF (paroxysmal atrial fibrillation) (HCC) 08/07/2021   Obesity, Class III, BMI 40-49.9 (morbid obesity) (HCC) 08/07/2021   Hypomagnesemia 08/07/2021   Post-traumatic wound infection    Cellulitis of right lower leg 08/06/2021   Asthma, chronic 08/06/2021   Type 2 diabetes mellitus (HCC) 08/18/2019   Coagulopathy (HCC) 08/18/2019   Long term current  use of anticoagulant therapy 08/18/2019   Atrial fibrillation with RVR (HCC) 08/17/2019   COVID-19    Hypoxia    Immune deficiency disorder (HCC)    Empyema (HCC) 03/20/2014   Empyema of right pleural space (HCC) 03/18/2014   CAP (community acquired pneumonia) 03/14/2014   Hydronephrosis, left 03/14/2014  Back pain 03/14/2014   Hypertension    OSA on CPAP    Hyperlipemia     Wyman Songster, PT 01/06/2022, 11:03 AM  [facility] [address] [place], [place], [address] Phone: [phone]   Fax:  [phone]  Name: [name] [name] MRN: [id number] Date of Birth: [birth year]

## 2022-01-13 ENCOUNTER — Ambulatory Visit (HOSPITAL_COMMUNITY): Payer: BC Managed Care – PPO | Attending: Pediatrics | Admitting: Physical Therapy

## 2022-01-13 DIAGNOSIS — S81801A Unspecified open wound, right lower leg, initial encounter: Secondary | ICD-10-CM | POA: Insufficient documentation

## 2022-01-13 DIAGNOSIS — R2689 Other abnormalities of gait and mobility: Secondary | ICD-10-CM | POA: Insufficient documentation

## 2022-01-13 DIAGNOSIS — S81832D Puncture wound without foreign body, left lower leg, subsequent encounter: Secondary | ICD-10-CM | POA: Insufficient documentation

## 2022-01-13 NOTE — Therapy (Signed)
[place] [facility] [address] [place], [place], [address] Phone: [phone]   Fax:  [phone]  Wound Care Therapy  Patient Details  Name: [name] [name] MRN: [id number] Date of Birth: 1961-02-01 Referring Provider (PT): Napoleon Form   Encounter Date: 01/13/2022   PT End of Session - 01/13/22 1022     Visit Number 4    Number of Visits 8    Date for PT Re-Evaluation 01/21/22    Authorization Type BCBS (no auth, 60 VL)    Authorization - Visit Number 16    Authorization - Number of Visits 60    Progress Note Due on Visit 8    PT Start Time [phone]   pt late for appointment   PT Stop Time 1020    PT Time Calculation (min) 25 min    Activity Tolerance Patient tolerated treatment well;No increased pain    Behavior During Therapy [facility] for tasks assessed/performed             Past Medical History:  Diagnosis Date   Asthma    Atrial fibrillation [facility])    Documented March 2021   Essential hypertension    History of nephrolithiasis    Hyperlipemia    Obesity    BMI 42.6   Psoriasis    Psoriatic arthritis (HCC)    Seasonal allergies    Sleep apnea     Past Surgical History:  Procedure Laterality Date   NOSE SURGERY     VIDEO ASSISTED THORACOSCOPY (VATS)/DECORTICATION Right 03/20/2014   Procedure: VIDEO ASSISTED THORACOSCOPY (VATS)/DECORTICATION;  Surgeon: Kerin Perna, MD;  Location: [facility] OR;  Service: Thoracic;  Laterality: Right;    There were no vitals filed for this visit.               Wound Therapy - 01/13/22 0001     Subjective PT states that he is going to [place] and will not be back next week.    Patient and Family Stated Goals wound to heal    Date of Onset 12/16/21    Prior Treatments self care with honey    Pain Score 1     Evaluation and Treatment Procedures Explained to Patient/Family Yes    Evaluation and Treatment Procedures agreed to    Wound Properties Date First Assessed: 12/22/21 Time First  Assessed: 0840 Wound Type: Puncture Location: Leg Location Orientation: Left;Lower;Lateral Wound Description (Comments): dry and pale Present on Admission: Yes   Dressing Type Compression wrap    Dressing Changed Changed    Dressing Status Old drainage    Dressing Change Frequency PRN    Site / Wound Assessment Friable;Bleeding;Pale;Granulation tissue    % Wound base Red or Granulating 70%    % Wound base Yellow/Fibrinous Exudate 30%    Peri-wound Assessment Intact;Edema;Hemosiderin    Wound Length (cm) 3.1 cm    Wound Width (cm) 2.5 cm    Wound Depth (cm) 0.2 cm    Wound Volume (cm^3) 1.55 cm^3    Wound Surface Area (cm^2) 7.75 cm^2    Margins Attached edges (approximated)    Drainage Amount Minimal    Drainage Description Serous    Treatment Cleansed;Debridement (Selective)    Selective Debridement - Location wound bed    Selective Debridement - Tools Used Forceps    Selective Debridement - Tissue Removed slough and devitalized tissue.    Wound Therapy - Clinical Statement Pt wound is healing slowly.  Therapist added foam on top of wound  to attempt to speed healing.  PT will not be back next week secondary to going on vacation.    Wound Therapy - Functional Problem List walking, standing    Factors Delaying/Impairing Wound Healing Diabetes Mellitus;Multiple medical problems    Hydrotherapy Plan Debridement;Dressing change;Electrical stimulation;Patient/family education;Pulsatile lavage with suction    Wound Therapy - Frequency --   1-2 x a week   Wound Therapy - Current Recommendations PT    Wound Plan Assess how foam does for healing.  Continut to cleanse, moisturized and debrided    Dressing  medihoney, 4x4, 1/2" foam and profore lite.                       PT Short Term Goals - 01/13/22 1028       PT SHORT TERM GOAL #1   Title Patients wound will be free from slough.    Time 2    Period Weeks    Status On-going    Target Date 01/05/22      PT SHORT TERM  GOAL #2   Title Wounds undermining and depth to be 1 cm or less to show healing.    Time 2    Period Weeks    Status Achieved               PT Long Term Goals - 01/13/22 1028       PT LONG TERM GOAL #1   Title Wound to be 100% granulated    Time 4    Period Weeks    Status On-going    Target Date 01/19/22      PT LONG TERM GOAL #2   Title Wound to have no undermining or depth to allow pt to feel confident in completing self care to wound.    Time 4    Period Weeks    Status On-going                   Plan - 01/13/22 1028     Personal Factors and Comorbidities Comorbidity 3+;Fitness    Comorbidities DM, psoriatic arthritis, nephrolithasis    Examination-Activity Limitations Bathing;Dressing    Examination-Participation Restrictions Occupation    Stability/Clinical Decision Making Evolving/Moderate complexity    Rehab Potential Good    PT Frequency 2x / week    PT Duration 4 weeks    PT Treatment/Interventions Compression bandaging;Patient/family education;Other (comment)   debridement   PT Next Visit Plan continue to assess wound for proper dressing change, debride as needed.    PT Home Exercise Plan N/A             Patient will benefit from skilled therapeutic intervention in order to improve the following deficits and impairments:  Pain, Decreased skin integrity  Visit Diagnosis: Puncture wound of left lower leg with complication, subsequent encounter     Problem List Patient Active Problem List   Diagnosis Date Noted   Impaired glucose tolerance 08/08/2021   GERD (gastroesophageal reflux disease) 08/07/2021   Hypokalemia 08/07/2021   Psoriatic arthritis (HCC) 08/07/2021   Insomnia 08/07/2021   PAF (paroxysmal atrial fibrillation) (HCC) 08/07/2021   Obesity, Class III, BMI 40-49.9 (morbid obesity) (HCC) 08/07/2021   Hypomagnesemia 08/07/2021   Post-traumatic wound infection    Cellulitis of right lower leg 08/06/2021   Asthma, chronic  08/06/2021   Type 2 diabetes mellitus (HCC) 08/18/2019   Coagulopathy (HCC) 08/18/2019   Long term current use of anticoagulant therapy 08/18/2019   Atrial  fibrillation with RVR (HCC) 08/17/2019   COVID-19    Hypoxia    Immune deficiency disorder (HCC)    Empyema (HCC) 03/20/2014   Empyema of right pleural space (HCC) 03/18/2014   CAP (community acquired pneumonia) 03/14/2014   Hydronephrosis, left 03/14/2014   Back pain 03/14/2014   Hypertension    OSA on CPAP    Hyperlipemia    Virgina Organ, PT CLT [phone]  01/13/2022, 10:29 AM  [place] [facility] [address] [place], [place], [address] Phone: [phone]   Fax:  [phone]  Name: [name] [name] MRN: [id number] Date of Birth: 1960/12/01

## 2022-01-25 ENCOUNTER — Ambulatory Visit (HOSPITAL_COMMUNITY): Payer: BC Managed Care – PPO

## 2022-01-25 ENCOUNTER — Encounter (HOSPITAL_COMMUNITY): Payer: Self-pay

## 2022-01-25 DIAGNOSIS — S81832D Puncture wound without foreign body, left lower leg, subsequent encounter: Secondary | ICD-10-CM

## 2022-01-25 DIAGNOSIS — S81801A Unspecified open wound, right lower leg, initial encounter: Secondary | ICD-10-CM

## 2022-01-25 DIAGNOSIS — R2689 Other abnormalities of gait and mobility: Secondary | ICD-10-CM

## 2022-01-25 NOTE — Therapy (Addendum)
[place] [facility] [address] [place], [place], [address] Phone: [phone]   Fax:  [phone]  Wound Care Therapy  Patient Details  Name: [name] [name] MRN: [id number] Date of Birth: 08-01-1960 Referring Provider (PT): Napoleon Form   Encounter Date: 01/25/2022   PT End of Session - 01/25/22 1339     Visit Number 5    Number of Visits 8    Date for PT Re-Evaluation 01/21/22    Authorization Type BCBS (no auth, 60 VL)    Authorization - Visit Number 17    Authorization - Number of Visits 60    Progress Note Due on Visit 8    PT Start Time 1303    PT Stop Time 1338    PT Time Calculation (min) 35 min    Activity Tolerance Patient tolerated treatment well;No increased pain    Behavior During Therapy [facility] for tasks assessed/performed             Past Medical History:  Diagnosis Date   Asthma    Atrial fibrillation [facility])    Documented March 2021   Essential hypertension    History of nephrolithiasis    Hyperlipemia    Obesity    BMI 42.6   Psoriasis    Psoriatic arthritis (HCC)    Seasonal allergies    Sleep apnea     Past Surgical History:  Procedure Laterality Date   NOSE SURGERY     VIDEO ASSISTED THORACOSCOPY (VATS)/DECORTICATION Right 03/20/2014   Procedure: VIDEO ASSISTED THORACOSCOPY (VATS)/DECORTICATION;  Surgeon: Kerin Perna, MD;  Location: [facility] OR;  Service: Thoracic;  Laterality: Right;    There were no vitals filed for this visit.    Subjective Assessment - 01/25/22 1340     Subjective Pt stated he had a wonderful time on vacation.  Stated legs swolled over bandages pushing down and had to remove on Saturday.  Arrived with new gauze and ace bandage on leg.  No reports of pain.    Currently in Pain? No/denies                       Wound Therapy - 01/25/22 0001     Subjective Pt stated he had a wonderful time on vacation.  Stated legs swolled over bandages pushing down and had to remove on  Saturday.  Arrived with new gauze and ace bandage on leg.  No reports of pain.    Patient and Family Stated Goals wound to heal    Date of Onset 12/16/21    Prior Treatments self care with honey    Pain Scale 0-10    Pain Score 0-No pain    Evaluation and Treatment Procedures Explained to Patient/Family Yes    Evaluation and Treatment Procedures agreed to    Wound Properties Date First Assessed: 12/22/21 Time First Assessed: 0840 Wound Type: Puncture Location: Leg Location Orientation: Left;Lower;Lateral Wound Description (Comments): dry and pale Present on Admission: Yes   Wound Image Images linked: 1    Dressing Type Honey;Compression wrap   Medihoney, 2x2, vaseline perimeter, 1/2in foam, profore   Dressing Changed Changed    Dressing Status Old drainage    Dressing Change Frequency PRN    Site / Wound Assessment Friable;Bleeding;Pale;Granulation tissue    % Wound base Red or Granulating 70%    % Wound base Yellow/Fibrinous Exudate 30%    Peri-wound Assessment Intact;Edema;Hemosiderin    Wound Length (cm) 2.7 cm  Wound Width (cm) 2.8 cm    Wound Depth (cm) 0.2 cm    Wound Volume (cm^3) 1.51 cm^3    Wound Surface Area (cm^2) 7.56 cm^2    Margins Attached edges (approximated)    Drainage Amount Minimal    Drainage Description Serous    Treatment Cleansed;Debridement (Selective)    Selective Debridement - Location wound bed    Selective Debridement - Tools Used Forceps    Selective Debridement - Tissue Removed slough and devitalized tissue.    Wound Therapy - Clinical Statement Pt arrived with new dressings intact, reports of bandages sliding down with bottelneck above dressings.  Wound cleansed and selective debridement to remove slough from wound bed.  Continued wiht foam to address hypergranulation on inferior portion of wound.  Changed to profore for edema control as pt reports he wears compressoin socks.  Encouraged to remove outer layer per pain control.    Wound Therapy -  Functional Problem List walking, standing    Factors Delaying/Impairing Wound Healing Diabetes Mellitus;Multiple medical problems    Hydrotherapy Plan Debridement;Dressing change;Electrical stimulation;Patient/family education;Pulsatile lavage with suction    Wound Therapy - Frequency --   1-2x/week   Wound Therapy - Current Recommendations PT    Wound Plan cleanse, moisturized and debrided    Dressing  vaseline perimenter, medihoney on wound bed, 2x2, 1/2in foam, profore with #7 netting                       PT Short Term Goals - 01/13/22 1028       PT SHORT TERM GOAL #1   Title Patients wound will be free from slough.    Time 2    Period Weeks    Status On-going    Target Date 01/05/22      PT SHORT TERM GOAL #2   Title Wounds undermining and depth to be 1 cm or less to show healing.    Time 2    Period Weeks    Status Achieved               PT Long Term Goals - 01/13/22 1028       PT LONG TERM GOAL #1   Title Wound to be 100% granulated    Time 4    Period Weeks    Status On-going    Target Date 01/19/22      PT LONG TERM GOAL #2   Title Wound to have no undermining or depth to allow pt to feel confident in completing self care to wound.    Time 4    Period Weeks    Status On-going                    Patient will benefit from skilled therapeutic intervention in order to improve the following deficits and impairments:     Visit Diagnosis: Puncture wound of left lower leg with complication, subsequent encounter  Other abnormalities of gait and mobility  Wound of right lower extremity, initial encounter     Problem List Patient Active Problem List   Diagnosis Date Noted   Impaired glucose tolerance 08/08/2021   GERD (gastroesophageal reflux disease) 08/07/2021   Hypokalemia 08/07/2021   Psoriatic arthritis (HCC) 08/07/2021   Insomnia 08/07/2021   PAF (paroxysmal atrial fibrillation) (HCC) 08/07/2021   Obesity, Class III,  BMI 40-49.9 (morbid obesity) (HCC) 08/07/2021   Hypomagnesemia 08/07/2021   Post-traumatic wound infection    Cellulitis of right lower  leg 08/06/2021   Asthma, chronic 08/06/2021   Type 2 diabetes mellitus (HCC) 08/18/2019   Coagulopathy (HCC) 08/18/2019   Long term current use of anticoagulant therapy 08/18/2019   Atrial fibrillation with RVR (HCC) 08/17/2019   COVID-19    Hypoxia    Immune deficiency disorder (HCC)    Empyema (HCC) 03/20/2014   Empyema of right pleural space (HCC) 03/18/2014   CAP (community acquired pneumonia) 03/14/2014   Hydronephrosis, left 03/14/2014   Back pain 03/14/2014   Hypertension    OSA on CPAP    Hyperlipemia    Becky Sax, LPTA/CLT; CBIS [phone]  Juel Burrow, PTA 01/25/2022, 4:48 PM  [place] [facility] [address] [place], [place], [address] Phone: [phone]   Fax:  [phone]  Name: [name] [name] MRN: [id number] Date of Birth: 1960/06/25

## 2022-01-26 ENCOUNTER — Ambulatory Visit (HOSPITAL_COMMUNITY): Payer: BC Managed Care – PPO | Admitting: Physical Therapy

## 2022-01-26 NOTE — Addendum Note (Signed)
Addended by: Bella Kennedy on: 01/26/2022 05:06 PM   Modules accepted: Orders

## 2022-02-02 ENCOUNTER — Ambulatory Visit (HOSPITAL_COMMUNITY): Payer: BC Managed Care – PPO | Admitting: Physical Therapy

## 2022-02-02 DIAGNOSIS — S81832D Puncture wound without foreign body, left lower leg, subsequent encounter: Secondary | ICD-10-CM

## 2022-02-02 DIAGNOSIS — R2689 Other abnormalities of gait and mobility: Secondary | ICD-10-CM

## 2022-02-02 NOTE — Therapy (Addendum)
[facility] [address] [place], [place], [address] Phone: [phone]   Fax:  [phone]  Wound Care Therapy  Patient Details  Name: [name] [name] MRN: [id number] Date of Birth: 1960-11-06 Referring Provider (PT): Napoleon Form   Encounter Date: 02/02/2022 PT End of Session - 01/25/22        Visit Number 7    Number of Visits 12     Date for PT Re-Evaluation 02/24/22     Authorization Type BCBS (no auth, 60 VL)     Authorization - Visit Number 18    Authorization - Number of Visits 60     Progress Note Due on Visit 8     PT Start Time 1515    PT Stop Time 1600    PT Time Calculation (min) 35 min     Activity Tolerance Patient tolerated treatment well;No increased pain     Behavior During Therapy [facility] for tasks assessed/performed      Past Medical History:  Diagnosis Date   Asthma    Atrial fibrillation [facility])    Documented March 2021   Essential hypertension    History of nephrolithiasis    Hyperlipemia    Obesity    BMI 42.6   Psoriasis    Psoriatic arthritis (HCC)    Seasonal allergies    Sleep apnea     Past Surgical History:  Procedure Laterality Date   NOSE SURGERY     VIDEO ASSISTED THORACOSCOPY (VATS)/DECORTICATION Right 03/20/2014   Procedure: VIDEO ASSISTED THORACOSCOPY (VATS)/DECORTICATION;  Surgeon: Kerin Perna, MD;  Location: [facility] OR;  Service: Thoracic;  Laterality: Right;    There were no vitals filed for this visit.               Wound Therapy - 02/02/22 1645     Subjective Pt reports he is not having any pain or issues.    Patient and Family Stated Goals wound to heal    Date of Onset 12/16/21    Prior Treatments self care with honey    Pain Scale 0-10    Pain Score 0-No pain    Evaluation and Treatment Procedures Explained to Patient/Family Yes    Evaluation and Treatment Procedures agreed to    Wound Properties Date First Assessed: 12/22/21 Time First Assessed: 0840 Wound Type:  Puncture Location: Leg Location Orientation: Left;Lower;Lateral Wound Description (Comments): dry and pale Present on Admission: Yes   Wound Image Images linked: 1    Dressing Type Silver hydrofiber;Compression wrap   Medihoney, 2x2, vaseline perimeter, 1/2in foam, profore   Dressing Changed Changed    Dressing Status Old drainage    Dressing Change Frequency PRN    Site / Wound Assessment Friable;Bleeding;Pale;Granulation tissue    % Wound base Red or Granulating 100%    Peri-wound Assessment Intact;Edema;Hemosiderin    Wound Length (cm) 1.4 cm    Wound Width (cm) 2.1 cm    Wound Depth (cm) 0 cm   hypergranulated   Wound Volume (cm^3) 0 cm^3    Wound Surface Area (cm^2) 2.94 cm^2    Margins Attached edges (approximated)    Drainage Amount Minimal    Drainage Description Serosanguineous    Treatment Debridement (Selective);Cleansed    Selective Debridement - Location wound bed    Selective Debridement - Tools Used Forceps    Selective Debridement - Tissue Removed slough and devitalized tissue.    Wound Therapy - Clinical Statement dressing intact. Noted drainage/maceration borders. Measured and  photographed with noted reduction in size, now 100%granulated but also with hypergranulation able skin surface.  Debrided devitalized tissue away from wound borders.  Changed dressing to silver hydrofiber to help reduce hypergranulation and maceration.  Continued with foam over wound to provide additional compression along with profore.  Cleansed and moisturized perimeter well prior to redressing.    Wound Therapy - Functional Problem List walking, standing    Factors Delaying/Impairing Wound Healing Diabetes Mellitus;Multiple medical problems    Hydrotherapy Plan Debridement;Dressing change;Electrical stimulation;Patient/family education;Pulsatile lavage with suction    Wound Therapy - Frequency --   1-2x/week   Wound Therapy - Current Recommendations PT    Wound Plan cleanse, moisturized and  debrided    Dressing  vaseline perimeter, silver hydrofiber on wound bed, 2x2, 1/2in foam, profore with #7 netting                       PT Short Term Goals - 01/13/22 1028       PT SHORT TERM GOAL #1   Title Patients wound will be free from slough.    Time 2    Period Weeks    Status On-going    Target Date 01/05/22      PT SHORT TERM GOAL #2   Title Wounds undermining and depth to be 1 cm or less to show healing.    Time 2    Period Weeks    Status Achieved               PT Long Term Goals - 01/13/22 1028       PT LONG TERM GOAL #1   Title Wound to be 100% granulated    Time 4    Period Weeks    Status On-going    Target Date 01/19/22      PT LONG TERM GOAL #2   Title Wound to have no undermining or depth to allow pt to feel confident in completing self care to wound.    Time 4    Period Weeks    Status On-going                    Patient will benefit from skilled therapeutic intervention in order to improve the following deficits and impairments:     Visit Diagnosis: Puncture wound of left lower leg with complication, subsequent encounter  Other abnormalities of gait and mobility     Problem List Patient Active Problem List   Diagnosis Date Noted   Impaired glucose tolerance 08/08/2021   GERD (gastroesophageal reflux disease) 08/07/2021   Hypokalemia 08/07/2021   Psoriatic arthritis (HCC) 08/07/2021   Insomnia 08/07/2021   PAF (paroxysmal atrial fibrillation) (HCC) 08/07/2021   Obesity, Class III, BMI 40-49.9 (morbid obesity) (HCC) 08/07/2021   Hypomagnesemia 08/07/2021   Post-traumatic wound infection    Cellulitis of right lower leg 08/06/2021   Asthma, chronic 08/06/2021   Type 2 diabetes mellitus (HCC) 08/18/2019   Coagulopathy (HCC) 08/18/2019   Long term current use of anticoagulant therapy 08/18/2019   Atrial fibrillation with RVR (HCC) 08/17/2019   COVID-19    Hypoxia    Immune deficiency disorder (HCC)     Empyema (HCC) 03/20/2014   Empyema of right pleural space (HCC) 03/18/2014   CAP (community acquired pneumonia) 03/14/2014   Hydronephrosis, left 03/14/2014   Back pain 03/14/2014   Hypertension    OSA on CPAP    Hyperlipemia     Bascom Levels, Terin Dierolf B, PTA  02/02/2022, 4:56 PM  [place] [address] [place], [place], [address] Phone: [phone]   Fax:  [phone]  Name: Clois Treanor MRN: [id number] Date of Birth: Jun 11, 1961

## 2022-02-08 ENCOUNTER — Ambulatory Visit (HOSPITAL_COMMUNITY): Payer: BC Managed Care – PPO | Admitting: Physical Therapy

## 2022-02-08 DIAGNOSIS — R2689 Other abnormalities of gait and mobility: Secondary | ICD-10-CM

## 2022-02-08 DIAGNOSIS — S81832D Puncture wound without foreign body, left lower leg, subsequent encounter: Secondary | ICD-10-CM | POA: Diagnosis not present

## 2022-02-08 DIAGNOSIS — S81801A Unspecified open wound, right lower leg, initial encounter: Secondary | ICD-10-CM

## 2022-02-08 NOTE — Therapy (Signed)
[place], [place], [address] Phone: [phone]   Fax:  [phone]  Wound Care Therapy  Patient Details  Name: [name] [name] MRN: [id number] Date of Birth: 1960-12-24 Referring Provider (PT): Arline Asp   Encounter Date: 02/08/2022   PT End of Session - 02/08/22 1612     Visit Number 8    Number of Visits 12    Date for PT Re-Evaluation 02/24/22    Authorization Type BCBS (no auth, 60 VL)    Authorization - Visit Number 17    Authorization - Number of Visits 60    Progress Note Due on Visit 8    PT Start Time [id number]    PT Stop Time 1440    PT Time Calculation (min) 45 min    Activity Tolerance Patient tolerated treatment well;No increased pain    Behavior During Therapy [facility] for tasks assessed/performed             Past Medical History:  Diagnosis Date   Asthma    Atrial fibrillation [facility])    Documented March 2021   Essential hypertension    History of nephrolithiasis    Hyperlipemia    Obesity    BMI 42.6   Psoriasis    Psoriatic arthritis ([place])    Seasonal allergies    Sleep apnea     Past Surgical History:  Procedure Laterality Date   NOSE SURGERY     VIDEO ASSISTED THORACOSCOPY (VATS)/DECORTICATION Right 03/20/2014   Procedure: VIDEO ASSISTED THORACOSCOPY (VATS)/DECORTICATION;  Surgeon: Ivin Poot, MD;  Location: [place];  Service: Thoracic;  Laterality: Right;    There were no vitals filed for this visit.               Wound Therapy - 02/08/22 1613     Subjective Pt comes today with skin tear on Rt LE from car door. States he was working on his car and ran into the door.  No issues or pain with Lt LE.    Patient and Family Stated Goals wound to heal    Date of Onset 12/16/21    Prior Treatments self care with honey    Pain Scale 0-10    Pain Score 0-No pain    Evaluation and Treatment Procedures Explained to Patient/Family Yes    Evaluation and Treatment Procedures  agreed to    Wound Properties Date First Assessed: 12/22/21 Time First Assessed: 0840 Wound Type: Puncture Location: Leg Location Orientation: Left;Lower;Lateral Wound Description (Comments): dry and pale Present on Admission: Yes   Wound Image Images linked: 1    Dressing Type Silver hydrofiber;Compression wrap   Medihoney, 2x2, vaseline perimeter, 1/2in foam, profore   Dressing Changed Changed    Dressing Status Old drainage    Dressing Change Frequency PRN    Site / Wound Assessment Friable;Bleeding;Pale;Granulation tissue    % Wound base Red or Granulating 100%    Peri-wound Assessment Intact;Edema;Hemosiderin    Wound Length (cm) 0.6 cm    Wound Width (cm) 1.5 cm    Wound Depth (cm) 0 cm    Wound Volume (cm^3) 0 cm^3    Wound Surface Area (cm^2) 0.9 cm^2    Margins Attached edges (approximated)    Drainage Amount Scant    Drainage Description Serosanguineous    Treatment Cleansed;Debridement (Selective)    Selective Debridement - Location wound bed    Selective Debridement - Tools Used Forceps    Selective Debridement - Tissue  Removed slough and devitalized tissue.    Wound Therapy - Clinical Statement Wound measured and photographed.  Drastic approximation as compared to last week.  No longer with hypergranulation.  cleansed and debrided deviatlized tissue from perimeter.  Changed dressing to xeroform due to dryness of woundbed.  Discontinued use of foam on wound as well today to see if bruised perimeter would improve without this.  Moisturzed peimeter well prior to reapplication of profore. Educated on importance of wearing compression stockings as they would also protect his skin from tears.  Pt verbalized understanding and is aware of need to order thicker, surgical grade stockings.    Wound Therapy - Functional Problem List walking, standing    Factors Delaying/Impairing Wound Healing Diabetes Mellitus;Multiple medical problems    Hydrotherapy Plan Debridement;Dressing  change;Electrical stimulation;Patient/family education;Pulsatile lavage with suction    Wound Therapy - Frequency --   1-2x/week   Wound Therapy - Current Recommendations PT    Wound Plan cleanse, moisturized and debrided    Dressing  vaseline perimeter,xerform on wound bed, 4x4, profore with #7 netting                       PT Short Term Goals - 01/13/22 1028       PT SHORT TERM GOAL #1   Title Patients wound will be free from slough.    Time 2    Period Weeks    Status On-going    Target Date 01/05/22      PT SHORT TERM GOAL #2   Title Wounds undermining and depth to be 1 cm or less to show healing.    Time 2    Period Weeks    Status Achieved               PT Long Term Goals - 01/13/22 1028       PT LONG TERM GOAL #1   Title Wound to be 100% granulated    Time 4    Period Weeks    Status On-going    Target Date 01/19/22      PT LONG TERM GOAL #2   Title Wound to have no undermining or depth to allow pt to feel confident in completing self care to wound.    Time 4    Period Weeks    Status On-going                    Patient will benefit from skilled therapeutic intervention in order to improve the following deficits and impairments:     Visit Diagnosis: Puncture wound of left lower leg with complication, subsequent encounter  Other abnormalities of gait and mobility  Wound of right lower extremity, initial encounter     Problem List Patient Active Problem List   Diagnosis Date Noted   Impaired glucose tolerance 08/08/2021   GERD (gastroesophageal reflux disease) 08/07/2021   Hypokalemia 08/07/2021   Psoriatic arthritis (HCC) 08/07/2021   Insomnia 08/07/2021   PAF (paroxysmal atrial fibrillation) (HCC) 08/07/2021   Obesity, Class III, BMI 40-49.9 (morbid obesity) (HCC) 08/07/2021   Hypomagnesemia 08/07/2021   Post-traumatic wound infection    Cellulitis of right lower leg 08/06/2021   Asthma, chronic 08/06/2021    Type 2 diabetes mellitus (HCC) 08/18/2019   Coagulopathy (HCC) 08/18/2019   Long term current use of anticoagulant therapy 08/18/2019   Atrial fibrillation with RVR (HCC) 08/17/2019   COVID-19    Hypoxia    Immune deficiency disorder (  HCC)    Empyema (HCC) 03/20/2014   Empyema of right pleural space (HCC) 03/18/2014   CAP (community acquired pneumonia) 03/14/2014   Hydronephrosis, left 03/14/2014   Back pain 03/14/2014   Hypertension    OSA on CPAP    Hyperlipemia    Lurena Nida, PTA/CLT [facility] Ph: [phone]  Samuel Bouche 02/08/2022, 4:18 PM  [facility] [address] [place], [place], [address] Phone: [phone]   Fax:  [phone]  Name: [name] [name] MRN: [id number] Date of Birth: 1960/12/28

## 2022-02-15 ENCOUNTER — Ambulatory Visit (HOSPITAL_COMMUNITY): Payer: BC Managed Care – PPO | Attending: Pediatrics | Admitting: Physical Therapy

## 2022-02-15 DIAGNOSIS — S81832D Puncture wound without foreign body, left lower leg, subsequent encounter: Secondary | ICD-10-CM | POA: Insufficient documentation

## 2022-02-15 DIAGNOSIS — S81801A Unspecified open wound, right lower leg, initial encounter: Secondary | ICD-10-CM | POA: Insufficient documentation

## 2022-02-15 DIAGNOSIS — R2689 Other abnormalities of gait and mobility: Secondary | ICD-10-CM | POA: Insufficient documentation

## 2022-02-15 NOTE — Therapy (Signed)
[place], [place], [address] Phone: [phone]   Fax:  [phone]  Wound Care Therapy  Patient Details  Name: [name] [name] MRN: [id number] Date of Birth: [birth year] Referring Provider (PT): Arline Asp   Encounter Date: 02/15/2022   PT End of Session - 02/15/22 1400     Visit Number 9    Number of Visits 12    Date for PT Re-Evaluation 02/24/22    Authorization Type BCBS (no auth, 60 VL)    Authorization - Visit Number 17    Authorization - Number of Visits 60    Progress Note Due on Visit 10    PT Start Time 1300    PT Stop Time 1320    PT Time Calculation (min) 20 min    Activity Tolerance Patient tolerated treatment well;No increased pain    Behavior During Therapy [facility] for tasks assessed/performed             Past Medical History:  Diagnosis Date   Asthma    Atrial fibrillation [facility])    Documented March 2021   Essential hypertension    History of nephrolithiasis    Hyperlipemia    Obesity    BMI 42.6   Psoriasis    Psoriatic arthritis ([place])    Seasonal allergies    Sleep apnea     Past Surgical History:  Procedure Laterality Date   NOSE SURGERY     VIDEO ASSISTED THORACOSCOPY (VATS)/DECORTICATION Right 03/20/2014   Procedure: VIDEO ASSISTED THORACOSCOPY (VATS)/DECORTICATION;  Surgeon: Ivin Poot, MD;  Location: [place];  Service: Thoracic;  Laterality: Right;    There were no vitals filed for this visit.               Wound Therapy - 02/15/22 1408     Subjective Pt states no issues, doing well.    Patient and Family Stated Goals wound to heal    Date of Onset 12/16/21    Prior Treatments self care with honey    Pain Scale 0-10    Pain Score 0-No pain    Evaluation and Treatment Procedures Explained to Patient/Family Yes    Evaluation and Treatment Procedures agreed to    Wound Properties Date First Assessed: 12/22/21 Time First Assessed: 0840 Wound Type: Puncture  Location: Leg Location Orientation: Left;Lower;Lateral Wound Description (Comments): dry and pale Present on Admission: Yes   Dressing Type Compression wrap;Impregnated gauze (petrolatum)   Medihoney, 2x2, vaseline perimeter, 1/2in foam, profore   Dressing Changed Changed    Dressing Status Old drainage    Dressing Change Frequency PRN    Site / Wound Assessment Friable;Bleeding;Pale;Granulation tissue    % Wound base Red or Granulating 100%    Peri-wound Assessment Intact;Edema;Hemosiderin    Wound Length (cm) 0.3 cm    Wound Width (cm) 0.3 cm    Wound Depth (cm) 0 cm    Wound Volume (cm^3) 0 cm^3    Wound Surface Area (cm^2) 0.09 cm^2    Margins Attached edges (approximated)    Drainage Amount Scant    Drainage Description Serosanguineous    Treatment Cleansed;Debridement (Selective)    Selective Debridement - Location wound bed    Selective Debridement - Tools Used Forceps    Selective Debridement - Tissue Removed slough and devitalized tissue.    Wound Therapy - Clinical Statement Wound drastically reduced this session.  some devitalized tissue periemter removed.  Continued with xeroform to wound and profore system.  Anticipate complete healing next session.    Wound Therapy - Functional Problem List walking, standing    Factors Delaying/Impairing Wound Healing Diabetes Mellitus;Multiple medical problems    Hydrotherapy Plan Debridement;Dressing change;Electrical stimulation;Patient/family education;Pulsatile lavage with suction    Wound Therapy - Frequency --   1-2x/week   Wound Therapy - Current Recommendations PT    Wound Plan cleanse, moisturize and debride.  Complete reassessement next session.    Dressing  vaseline perimeter,xerform on wound bed, 4x4, profore with #7 netting                       PT Short Term Goals - 01/13/22 1028       PT SHORT TERM GOAL #1   Title Patients wound will be free from slough.    Time 2    Period Weeks    Status On-going     Target Date 01/05/22      PT SHORT TERM GOAL #2   Title Wounds undermining and depth to be 1 cm or less to show healing.    Time 2    Period Weeks    Status Achieved               PT Long Term Goals - 01/13/22 1028       PT LONG TERM GOAL #1   Title Wound to be 100% granulated    Time 4    Period Weeks    Status On-going    Target Date 01/19/22      PT LONG TERM GOAL #2   Title Wound to have no undermining or depth to allow pt to feel confident in completing self care to wound.    Time 4    Period Weeks    Status On-going                    Patient will benefit from skilled therapeutic intervention in order to improve the following deficits and impairments:     Visit Diagnosis: Puncture wound of left lower leg with complication, subsequent encounter  Other abnormalities of gait and mobility  Wound of right lower extremity, initial encounter     Problem List Patient Active Problem List   Diagnosis Date Noted   Impaired glucose tolerance 08/08/2021   GERD (gastroesophageal reflux disease) 08/07/2021   Hypokalemia 08/07/2021   Psoriatic arthritis (HCC) 08/07/2021   Insomnia 08/07/2021   PAF (paroxysmal atrial fibrillation) (HCC) 08/07/2021   Obesity, Class III, BMI 40-49.9 (morbid obesity) (HCC) 08/07/2021   Hypomagnesemia 08/07/2021   Post-traumatic wound infection    Cellulitis of right lower leg 08/06/2021   Asthma, chronic 08/06/2021   Type 2 diabetes mellitus (HCC) 08/18/2019   Coagulopathy (HCC) 08/18/2019   Long term current use of anticoagulant therapy 08/18/2019   Atrial fibrillation with RVR (HCC) 08/17/2019   COVID-19    Hypoxia    Immune deficiency disorder (HCC)    Empyema (HCC) 03/20/2014   Empyema of right pleural space (HCC) 03/18/2014   CAP (community acquired pneumonia) 03/14/2014   Hydronephrosis, left 03/14/2014   Back pain 03/14/2014   Hypertension    OSA on CPAP    Hyperlipemia    Lurena Nida, PTA/CLT [facility]  [facility] Outpatient Rehabitation [facility] Ph: [phone]  Samuel Bouche 02/15/2022, 2:14 PM  [facility] [address] [place], [place], [address] Phone: [phone]   Fax:  [phone]  Name: Aharon Carriere MRN:  [id number] Date of Birth: July 11, 1960

## 2022-02-22 ENCOUNTER — Ambulatory Visit (HOSPITAL_COMMUNITY): Payer: BC Managed Care – PPO | Admitting: Physical Therapy

## 2022-02-22 DIAGNOSIS — S81832D Puncture wound without foreign body, left lower leg, subsequent encounter: Secondary | ICD-10-CM | POA: Diagnosis not present

## 2022-02-22 DIAGNOSIS — S81801A Unspecified open wound, right lower leg, initial encounter: Secondary | ICD-10-CM

## 2022-02-22 DIAGNOSIS — R2689 Other abnormalities of gait and mobility: Secondary | ICD-10-CM

## 2022-02-22 NOTE — Therapy (Addendum)
[place], [place], [address] Phone: [phone]   Fax:  [phone]  Wound Care Therapy DISCHARGE  Patient Details  Name: [name] [name] MRN: [id number] Date of Birth: 01/29/1961 Referring Provider (PT): Arline Asp  PHYSICAL THERAPY DISCHARGE SUMMARY  Visits from Start of Care: 10  Current functional level related to goals / functional outcomes: Wound is healed   Remaining deficits: none   Education / Equipment: HEp   Patient agrees to discharge. Patient goals were met. Patient is being discharged due to meeting the stated rehab goals.  Encounter Date: 02/22/2022   PT End of Session - 02/22/22 1412     Visit Number 10    Number of Visits 12    Date for PT Re-Evaluation 02/24/22    Authorization Type BCBS (no auth, 60 VL)    Authorization - Visit Number 17    Authorization - Number of Visits 60    Progress Note Due on Visit 10    PT Start Time 1300    PT Stop Time 1312    PT Time Calculation (min) 12 min    Activity Tolerance Patient tolerated treatment well;No increased pain    Behavior During Therapy [facility] for tasks assessed/performed             Past Medical History:  Diagnosis Date   Asthma    Atrial fibrillation [facility])    Documented March 2021   Essential hypertension    History of nephrolithiasis    Hyperlipemia    Obesity    BMI 42.6   Psoriasis    Psoriatic arthritis ([place])    Seasonal allergies    Sleep apnea     Past Surgical History:  Procedure Laterality Date   NOSE SURGERY     VIDEO ASSISTED THORACOSCOPY (VATS)/DECORTICATION Right 03/20/2014   Procedure: VIDEO ASSISTED THORACOSCOPY (VATS)/DECORTICATION;  Surgeon: Ivin Poot, MD;  Location: [place];  Service: Thoracic;  Laterality: Right;    There were no vitals filed for this visit.               Wound Therapy - 02/22/22 0001     Subjective comes today with dressing intact, no pain or issues.  Dressing  removed from [place] and states he plans on redressing it later at home.    Patient and Family Stated Goals wound to heal    Date of Onset 12/16/21    Prior Treatments self care with honey    Pain Scale 0-10    Pain Score 0-No pain    Evaluation and Treatment Procedures Explained to Patient/Family Yes    Evaluation and Treatment Procedures agreed to    Wound Properties Date First Assessed: 12/22/21 Time First Assessed: 0840 Wound Type: Puncture Location: Leg Location Orientation: Left;Lower;Lateral Wound Description (Comments): dry and pale Present on Admission: Yes   Wound Image Images linked: 1    Dressing Type Compression wrap    Dressing Changed Other (Comment)    Wound Therapy - Clinical Statement Wound is now healed.  Photographed this session.  Still with some bruising perimeter of unknown origin/cause but no signs/symptoms of infection.  No intervention or dressing needed at this point.  Pt no longer requires skilled services.    Wound Plan Instructions to keep clean/protected.  No further intervention needed.  Discharge.                       PT Short Term Goals -  02/22/22 1426       PT SHORT TERM GOAL #1   Title Patients wound will be free from slough.    Time 2    Period Weeks    Status Achieved    Target Date 01/05/22      PT SHORT TERM GOAL #2   Title Wounds undermining and depth to be 1 cm or less to show healing.    Time 2    Period Weeks    Status Achieved               PT Long Term Goals - 02/22/22 1427       PT LONG TERM GOAL #1   Title Wound to be 100% granulated    Time 4    Period Weeks    Status Achieved    Target Date 01/19/22      PT LONG TERM GOAL #2   Title Wound to have no undermining or depth to allow pt to feel confident in completing self care to wound.    Time 4    Period Weeks    Status Achieved                    Patient will benefit from skilled therapeutic intervention in order to improve the following  deficits and impairments:     Visit Diagnosis: Puncture wound of left lower leg with complication, subsequent encounter  Other abnormalities of gait and mobility  Wound of right lower extremity, initial encounter     Problem List Patient Active Problem List   Diagnosis Date Noted   Impaired glucose tolerance 08/08/2021   GERD (gastroesophageal reflux disease) 08/07/2021   Hypokalemia 08/07/2021   Psoriatic arthritis ([place]) 08/07/2021   Insomnia 08/07/2021   PAF (paroxysmal atrial fibrillation) ([place]) 08/07/2021   Obesity, Class III, BMI 40-49.9 (morbid obesity) ([place]) 08/07/2021   Hypomagnesemia 08/07/2021   Post-traumatic wound infection    Cellulitis of right lower leg 08/06/2021   Asthma, chronic 08/06/2021   Type 2 diabetes mellitus ([place]) 08/18/2019   Coagulopathy ([place]) 08/18/2019   Long term current use of anticoagulant therapy 08/18/2019   Atrial fibrillation with RVR ([place]) 08/17/2019   COVID-19    Hypoxia    Immune deficiency disorder ([place])    Empyema ([place]) 03/20/2014   Empyema of right pleural space ([place]) 03/18/2014   CAP (community acquired pneumonia) 03/14/2014   Hydronephrosis, left 03/14/2014   Back pain 03/14/2014   Hypertension    OSA on CPAP    Hyperlipemia    Teena Irani, PTA/CLT [place] Ph: [place], PT CLT [phone]  02/22/2022, 2:27 PM  [place], [place], [address] Phone: [phone]   Fax:  [phone]  Name: [name] [name] MRN: [id number] Date of Birth: January 18, 1961

## 2022-03-15 ENCOUNTER — Inpatient Hospital Stay (HOSPITAL_COMMUNITY)
Admission: EM | Admit: 2022-03-15 | Discharge: 2022-03-17 | DRG: 603 | Disposition: A | Discharge status: Home / Self Care | Payer: BC Managed Care – PPO | Attending: Internal Medicine | Admitting: Internal Medicine

## 2022-03-15 ENCOUNTER — Emergency Department (HOSPITAL_COMMUNITY): Payer: BC Managed Care – PPO

## 2022-03-15 ENCOUNTER — Encounter (HOSPITAL_COMMUNITY): Payer: Self-pay | Admitting: Emergency Medicine

## 2022-03-15 ENCOUNTER — Other Ambulatory Visit: Payer: Self-pay

## 2022-03-15 DIAGNOSIS — G4730 Sleep apnea, unspecified: Secondary | ICD-10-CM | POA: Diagnosis present

## 2022-03-15 DIAGNOSIS — E669 Obesity, unspecified: Secondary | ICD-10-CM | POA: Diagnosis present

## 2022-03-15 DIAGNOSIS — Z79899 Other long term (current) drug therapy: Secondary | ICD-10-CM | POA: Diagnosis not present

## 2022-03-15 DIAGNOSIS — G8929 Other chronic pain: Secondary | ICD-10-CM | POA: Diagnosis present

## 2022-03-15 DIAGNOSIS — L4050 Arthropathic psoriasis, unspecified: Secondary | ICD-10-CM | POA: Diagnosis present

## 2022-03-15 DIAGNOSIS — K219 Gastro-esophageal reflux disease without esophagitis: Secondary | ICD-10-CM | POA: Diagnosis present

## 2022-03-15 DIAGNOSIS — Z28310 Unvaccinated for covid-19: Secondary | ICD-10-CM | POA: Diagnosis not present

## 2022-03-15 DIAGNOSIS — Z6841 Body Mass Index (BMI) 40.0 and over, adult: Secondary | ICD-10-CM

## 2022-03-15 DIAGNOSIS — E785 Hyperlipidemia, unspecified: Secondary | ICD-10-CM

## 2022-03-15 DIAGNOSIS — I10 Essential (primary) hypertension: Secondary | ICD-10-CM | POA: Diagnosis present

## 2022-03-15 DIAGNOSIS — M25562 Pain in left knee: Secondary | ICD-10-CM | POA: Diagnosis present

## 2022-03-15 DIAGNOSIS — L03116 Cellulitis of left lower limb: Principal | ICD-10-CM | POA: Diagnosis present

## 2022-03-15 DIAGNOSIS — E119 Type 2 diabetes mellitus without complications: Secondary | ICD-10-CM | POA: Diagnosis present

## 2022-03-15 DIAGNOSIS — E782 Mixed hyperlipidemia: Secondary | ICD-10-CM | POA: Diagnosis not present

## 2022-03-15 DIAGNOSIS — L0390 Cellulitis, unspecified: Secondary | ICD-10-CM | POA: Diagnosis present

## 2022-03-15 LAB — CBC WITH DIFFERENTIAL/PLATELET
Abs Immature Granulocytes: 0.39 10*3/uL — ABNORMAL HIGH (ref 0.00–0.07)
Basophils Absolute: 0.1 10*3/uL (ref 0.0–0.1)
Basophils Relative: 1 %
Eosinophils Absolute: 0.1 10*3/uL (ref 0.0–0.5)
Eosinophils Relative: 1 %
HCT: 37 % — ABNORMAL LOW (ref 39.0–52.0)
Hemoglobin: 12.3 g/dL — ABNORMAL LOW (ref 13.0–17.0)
Immature Granulocytes: 2 %
Lymphocytes Relative: 6 %
Lymphs Abs: 1 10*3/uL (ref 0.7–4.0)
MCH: 32.3 pg (ref 26.0–34.0)
MCHC: 33.2 g/dL (ref 30.0–36.0)
MCV: 97.1 fL (ref 80.0–100.0)
Monocytes Absolute: 1 10*3/uL (ref 0.1–1.0)
Monocytes Relative: 6 %
Neutro Abs: 13.6 10*3/uL — ABNORMAL HIGH (ref 1.7–7.7)
Neutrophils Relative %: 84 %
Platelets: 279 10*3/uL (ref 150–400)
RBC: 3.81 MIL/uL — ABNORMAL LOW (ref 4.22–5.81)
RDW: 17.1 % — ABNORMAL HIGH (ref 11.5–15.5)
WBC: 16.2 10*3/uL — ABNORMAL HIGH (ref 4.0–10.5)
nRBC: 0 % (ref 0.0–0.2)

## 2022-03-15 LAB — BASIC METABOLIC PANEL
Anion gap: 8 (ref 5–15)
BUN: 16 mg/dL (ref 6–20)
CO2: 22 mmol/L (ref 22–32)
Calcium: 8.7 mg/dL — ABNORMAL LOW (ref 8.9–10.3)
Chloride: 112 mmol/L — ABNORMAL HIGH (ref 98–111)
Creatinine, Ser: 1.04 mg/dL (ref 0.61–1.24)
GFR, Estimated: >60 mL/min (ref >60)
Glucose, Bld: 118 mg/dL — ABNORMAL HIGH (ref 70–99)
Potassium: 3.8 mmol/L (ref 3.5–5.1)
Sodium: 142 mmol/L (ref 135–145)

## 2022-03-15 MED ORDER — HEPARIN SODIUM (PORCINE) 5000 UNIT/ML IJ SOLN
5000.0000 [IU] | Freq: Three times a day (TID) | INTRAMUSCULAR | Status: DC
  Administered 2022-03-15 – 2022-03-17 (×5): 5000 [IU] via SUBCUTANEOUS
  Filled 2022-03-15 (×5): qty 1

## 2022-03-15 MED ORDER — MONTELUKAST SODIUM 10 MG PO TABS
10.0000 mg | ORAL_TABLET | Freq: Every day | ORAL | Status: DC
  Administered 2022-03-15 – 2022-03-16 (×2): 10 mg via ORAL
  Filled 2022-03-15 (×2): qty 1

## 2022-03-15 MED ORDER — GABAPENTIN 300 MG PO CAPS
600.0000 mg | ORAL_CAPSULE | Freq: Two times a day (BID) | ORAL | Status: DC
  Administered 2022-03-15: 600 mg via ORAL
  Filled 2022-03-15 (×2): qty 2

## 2022-03-15 MED ORDER — VANCOMYCIN HCL 1500 MG/300ML IV SOLN
1500.0000 mg | Freq: Two times a day (BID) | INTRAVENOUS | Status: DC
  Administered 2022-03-16 – 2022-03-17 (×3): 1500 mg via INTRAVENOUS
  Filled 2022-03-15 (×3): qty 300

## 2022-03-15 MED ORDER — PANTOPRAZOLE SODIUM 40 MG PO TBEC
40.0000 mg | DELAYED_RELEASE_TABLET | Freq: Every day | ORAL | Status: DC
  Administered 2022-03-15 – 2022-03-17 (×3): 40 mg via ORAL
  Filled 2022-03-15 (×3): qty 1

## 2022-03-15 MED ORDER — GABAPENTIN 600 MG PO TABS
600.0000 mg | ORAL_TABLET | Freq: Two times a day (BID) | ORAL | Status: DC
  Filled 2022-03-15 (×2): qty 1

## 2022-03-15 MED ORDER — ZOLPIDEM TARTRATE 5 MG PO TABS
10.0000 mg | ORAL_TABLET | Freq: Every evening | ORAL | Status: DC | PRN
  Administered 2022-03-15 – 2022-03-16 (×2): 10 mg via ORAL
  Filled 2022-03-15 (×2): qty 2

## 2022-03-15 MED ORDER — SODIUM CHLORIDE 0.9 % IV SOLN
2.0000 g | Freq: Once | INTRAVENOUS | Status: AC
  Administered 2022-03-15: 2 g via INTRAVENOUS
  Filled 2022-03-15: qty 20

## 2022-03-15 MED ORDER — LISINOPRIL 10 MG PO TABS
20.0000 mg | ORAL_TABLET | Freq: Every day | ORAL | Status: DC
  Administered 2022-03-15 – 2022-03-17 (×3): 20 mg via ORAL
  Filled 2022-03-15 (×3): qty 2

## 2022-03-15 MED ORDER — MOMETASONE FURO-FORMOTEROL FUM 200-5 MCG/ACT IN AERO
2.0000 | INHALATION_SPRAY | Freq: Two times a day (BID) | RESPIRATORY_TRACT | Status: DC
  Administered 2022-03-15 – 2022-03-17 (×4): 2 via RESPIRATORY_TRACT
  Filled 2022-03-15: qty 8.8

## 2022-03-15 MED ORDER — IOHEXOL 300 MG/ML  SOLN
100.0000 mL | Freq: Once | INTRAMUSCULAR | Status: AC | PRN
  Administered 2022-03-15: 80 mL via INTRAVENOUS

## 2022-03-15 MED ORDER — ATORVASTATIN CALCIUM 40 MG PO TABS
80.0000 mg | ORAL_TABLET | Freq: Every day | ORAL | Status: DC
  Administered 2022-03-16 – 2022-03-17 (×2): 80 mg via ORAL
  Filled 2022-03-15 (×2): qty 2

## 2022-03-15 MED ORDER — VANCOMYCIN HCL 2000 MG/400ML IV SOLN
2000.0000 mg | Freq: Once | INTRAVENOUS | Status: AC
  Administered 2022-03-15: 2000 mg via INTRAVENOUS
  Filled 2022-03-15: qty 400

## 2022-03-15 NOTE — Progress Notes (Signed)
Pharmacy Antibiotic Note  [name] [name] is a 61 y.o. male admitted on 03/15/2022 with cellulitis.  Pharmacy has been consulted for vancomycin dosing.  Plan: Vancomycin 2000 mg IV x 1 dose. Vancomycin 1500 mg IV every 12 hours. Monitor labs, c/s, and vanco level as indicated.  Height: 5\' 9"  (175.3 cm) Weight: 136.1 kg (300 lb) IBW/kg (Calculated) : 70.7  Temp (24hrs), Avg:98.1 F (36.7 C), Min:98.1 F (36.7 C), Max:98.1 F (36.7 C)  Recent Labs  Lab 03/15/22 1538  WBC 16.2*  CREATININE 1.04    Estimated Creatinine Clearance: 103.5 mL/min (by C-G formula based on SCr of 1.04 mg/dL).    No Known Allergies  Antimicrobials this admission: Vanco 10/3 >> CTX 10/3 >>  Microbiology results: 10/3 BCx: pending  Thank you for allowing pharmacy to be a part of this patient's care.  Margot Ables, PharmD Clinical Pharmacist 03/15/2022 4:56 PM

## 2022-03-15 NOTE — ED Provider Notes (Signed)
[facility]GENCY [facility]PARTMENT Provider Note  CSN: [id number] Arrival date & time: 03/15/22 1456  Chief Complaint(s) Wound Check  HPI [name] [name] is a 61 y.o. male with history of hypertension, hyperlipidemia diabetes presenting to the emergency department with left leg pain.  The patient reports that he developed redness in his left leg last week.  He went to urgent care, was given a shot of IM antibiotics, started on Keflex and Bactrim.  He reports his symptoms have progressed.  Denies fevers or chills.  Reports ongoing pain.  No nausea, vomiting, chest pain, abdominal pain, shortness of breath.  Reports chronic leg swelling which is slightly worse   Past Medical History Past Medical History:  Diagnosis Date   Asthma    Atrial fibrillation [facility](HCC)    Documented March 2021   Essential hypertension    History of nephrolithiasis    Hyperlipemia    Obesity    BMI 42.6   Psoriasis    Psoriatic arthritis (HCC)    Seasonal allergies    Sleep apnea    Patient Active Problem List   Diagnosis Date Noted   Cellulitis 03/15/2022   Impaired glucose tolerance 08/08/2021   GERD (gastroesophageal reflux disease) 08/07/2021   Hypokalemia 08/07/2021   Psoriatic arthritis (HCC) 08/07/2021   Insomnia 08/07/2021   PAF (paroxysmal atrial fibrillation) (HCC) 08/07/2021   Obesity, Class III, BMI 40-49.9 (morbid obesity) (HCC) 08/07/2021   Hypomagnesemia 08/07/2021   Post-traumatic wound infection    Cellulitis of right lower leg 08/06/2021   Asthma, chronic 08/06/2021   Type 2 diabetes mellitus (HCC) 08/18/2019   Coagulopathy (HCC) 08/18/2019   Long term current use of anticoagulant therapy 08/18/2019   Atrial fibrillation with RVR (HCC) 08/17/2019   COVID-19    Hypoxia    Immune deficiency disorder (HCC)    Empyema (HCC) 03/20/2014   Empyema of right pleural space (HCC) 03/18/2014   CAP (community acquired pneumonia) 03/14/2014   Hydronephrosis, left 03/14/2014   Back pain 03/14/2014    Hypertension    OSA on CPAP    Hyperlipemia    Home Medication(s) Prior to Admission medications   Medication Sig Start Date End Date Taking? Authorizing Provider  albuterol (VENTOLIN HFA) 108 (90 Base) MCG/ACT inhaler every 4 (four) hours as needed for shortness of breath or wheezing. 02/05/22  Yes [provider]  atorvastatin (LIPITOR) 80 MG tablet Take 80 mg by mouth daily. 04/18/19  Yes [provider]  Fluticasone-Salmeterol (ADVAIR) 250-50 MCG/DOSE AEPB Inhale 1 puff into the lungs 2 (two) times daily.   Yes [provider]  folic acid (FOLVITE) 1 MG tablet Take 1 mg by mouth daily. 07/25/19  Yes [provider]  gabapentin (NEURONTIN) 600 MG tablet Take 600 mg by mouth 2 (two) times daily. 12/21/21  Yes [provider]  HYDROcodone-acetaminophen (NORCO) 10-325 MG tablet Take 1 tablet by mouth 4 (four) times daily. 03/02/22  Yes [provider]  lisinopril (ZESTRIL) 20 MG tablet Take 20 mg by mouth daily. 10/22/20  Yes [provider]  methotrexate (RHEUMATREX) 2.5 MG tablet Take 20 mg by mouth once a week. 07/25/19  Yes [provider]  montelukast (SINGULAIR) 10 MG tablet Take 10 mg by mouth at bedtime.   Yes [provider]  omeprazole (PRILOSEC) 40 MG capsule Take 1 capsule (40 mg total) by mouth daily. Patient taking differently: Take 20 mg by mouth daily. 08/19/19  Yes Johnson, Clanford L, MD  zolpidem (AMBIEN) 10 MG tablet Take 10  mg by mouth at bedtime as needed for sleep.   Yes [provider]  sildenafil (VIAGRA) 100 MG tablet Take 100 mg by mouth as directed. 12/27/21   [provider]                                                                                                                                    Past Surgical History Past Surgical History:  Procedure Laterality Date   NOSE SURGERY     VIDEO ASSISTED THORACOSCOPY (VATS)/DECORTICATION Right 03/20/2014   Procedure:  VIDEO ASSISTED THORACOSCOPY (VATS)/DECORTICATION;  Surgeon: Kerin Perna, MD;  Location: [facility] OR;  Service: Thoracic;  Laterality: Right;   Family History Family History  Problem Relation Age of Onset   Atrial fibrillation Mother     Social History Social History   Tobacco Use   Smoking status: Never   Smokeless tobacco: Never  Substance Use Topics   Alcohol use: No   Drug use: No   Allergies Patient has no known allergies.  Review of Systems Review of Systems  Physical Exam Vital Signs  I have reviewed the triage vital signs BP (!) 167/104 (BP Location: Left Arm)   Pulse 67   Temp 98.8 F (37.1 C) (Oral)   Resp 18   Ht 5\' 9"  (1.753 m)   Wt 136.1 kg   SpO2 100%   BMI 44.30 kg/m  Physical Exam Vitals and nursing note reviewed.  Constitutional:      General: He is not in acute distress.    Appearance: Normal appearance.  HENT:     Mouth/Throat:     Mouth: Mucous membranes are moist.  Eyes:     Conjunctiva/sclera: Conjunctivae normal.  Cardiovascular:     Rate and Rhythm: Normal rate and regular rhythm.  Pulmonary:     Effort: Pulmonary effort is normal. No respiratory distress.     Breath sounds: Normal breath sounds.  Abdominal:     General: Abdomen is flat.     Palpations: Abdomen is soft.     Tenderness: There is no abdominal tenderness.  Musculoskeletal:     Right lower leg: Edema present.     Left lower leg: Edema present.     Comments: Large area of swelling and tenderness to the left posterior thigh  Skin:    General: Skin is warm and dry.     Capillary Refill: Capillary refill takes less than 2 seconds.     Comments: Large area of erythema to the left posterior thigh, slightly indurated central area of possible drainage  Neurological:     Mental Status: He is alert and oriented to person, place, and time. Mental status is at baseline.  Psychiatric:        Mood and Affect: Mood normal.        Behavior: Behavior normal.     ED Results and  Treatments Labs (all labs ordered are listed, but  only abnormal results are displayed) Labs Reviewed  CBC WITH DIFFERENTIAL/PLATELET - Abnormal; Notable for the following components:      Result Value   WBC 16.2 (*)    RBC 3.81 (*)    Hemoglobin 12.3 (*)    HCT 37.0 (*)    RDW 17.1 (*)    Neutro Abs 13.6 (*)    Abs Immature Granulocytes 0.39 (*)    All other components within normal limits  BASIC METABOLIC PANEL - Abnormal; Notable for the following components:   Chloride 112 (*)    Glucose, Bld 118 (*)    Calcium 8.7 (*)    All other components within normal limits  CULTURE, BLOOD (ROUTINE X 2)  CULTURE, BLOOD (ROUTINE X 2)  CREATININE, SERUM                                                                                                                          Radiology CT FEMUR LEFT W CONTRAST  Result Date: 03/15/2022 CLINICAL DATA:  Soft tissue infection. Pain and swelling in the thigh EXAM: CT OF THE LOWER LEFT EXTREMITY WITH CONTRAST TECHNIQUE: Multidetector CT imaging of the lower left extremity was performed according to the standard protocol following intravenous contrast administration. RADIATION DOSE REDUCTION: This exam was performed according to the departmental dose-optimization program which includes automated exposure control, adjustment of the mA and/or kV according to patient size and/or use of iterative reconstruction technique. CONTRAST:  11mL OMNIPAQUE IOHEXOL 300 MG/ML  SOLN COMPARISON:  Overlapping portions CT lower leg 08/08/2021 FINDINGS: Bones/Joint/Cartilage No bony destructive findings characteristic of osteomyelitis. Bipartite patella. Osteoarthritis of the knee. 8 mm chronic free osteochondral fragment in the posterior intercondylar notch. Moderate knee effusion. No gas in the knee joint or large amount of synovitis noted. Baker's cyst containing a 9 mm free osteochondral fragment. Ligaments Suboptimally assessed by CT. Muscles and Tendons Unremarkable Soft  tissues Mild sigmoid colon diverticulosis.  Fatty left spermatic cord. Subcutaneous edema in the lateral thigh with some accumulation along the superficial fascia margin for example on image 184 series 3. No drainable collection is identified. There is also a cutaneous and subcutaneous edema in the left medial thigh which is asymmetric compared to the right medial thigh for example on image 210 series 3. IMPRESSION: 1. Subcutaneous edema in the lateral thigh with some accumulation along the superficial fascia margin. There is also cutaneous and subcutaneous edema in the left medial thigh. No drainable collection is identified. 2. No bony destructive findings characteristic of osteomyelitis. 3. Moderate knee effusion. 4. Baker's cyst containing a 9 mm free osteochondral fragment. Also 8 mm chronic free osteochondral fragment in the posterior intercondylar notch. 5. Mild sigmoid colon diverticulosis. 6. Bipartite patella. 7. Osteoarthritis of the knee. 8. Fatty left spermatic cord. Electronically Signed   By: Van Clines M.D.   On: 03/15/2022 19:03    Pertinent labs & imaging results that were available during my care of the patient were reviewed by me  and considered in my medical decision making (see MDM for details).  Medications Ordered in ED Medications  vancomycin (VANCOREADY) IVPB 1500 mg/300 mL (has no administration in time range)  cefTRIAXone (ROCEPHIN) 2 g in sodium chloride 0.9 % 100 mL IVPB (0 g Intravenous Stopped 03/15/22 1743)  vancomycin (VANCOREADY) IVPB 2000 mg/400 mL (2,000 mg Intravenous New Bag/Given 03/15/22 1745)  iohexol (OMNIPAQUE) 300 MG/ML solution 100 mL (80 mLs Intravenous Contrast Given 03/15/22 1825)                                                                                                                                     Procedures Procedures  (including critical care time)  Medical Decision Making / ED Course   MDM:  61 year old male presenting to the  emergency department with pain to the left posterior thigh.  On exam, patient has a large area of erythema and tenderness, with a small area of fluctuance.  Possible small area of drainage.  Appearance concerning for cellulitis.  Could also be abscess given area of induration.  Will obtain CT scan to further evaluate extent.  Patient clinically well-appearing, no fever, no tachycardia.  Given comorbidities, failure of outpatient treatment, may need admission for IV antibiotics or dalbavancin pending on results of imaging.  Clinical Course as of 03/15/22 2041  Tue Mar 15, 2022  1952 Admitted to the medicine team for cellulitis. CT without drainable fluid collection. Doubt necrotizing infection. [WS]    Clinical Course User Index [WS] Lonell Grandchild, MD     Additional history obtained: -Additional history obtained from family -External records from outside source obtained and reviewed including: Chart review including previous notes, labs, imaging, consultation notes   Lab Tests: -I ordered, reviewed, and interpreted labs.   The pertinent results include:   Labs Reviewed  CBC WITH DIFFERENTIAL/PLATELET - Abnormal; Notable for the following components:      Result Value   WBC 16.2 (*)    RBC 3.81 (*)    Hemoglobin 12.3 (*)    HCT 37.0 (*)    RDW 17.1 (*)    Neutro Abs 13.6 (*)    Abs Immature Granulocytes 0.39 (*)    All other components within normal limits  BASIC METABOLIC PANEL - Abnormal; Notable for the following components:   Chloride 112 (*)    Glucose, Bld 118 (*)    Calcium 8.7 (*)    All other components within normal limits  CULTURE, BLOOD (ROUTINE X 2)  CULTURE, BLOOD (ROUTINE X 2)  CREATININE, SERUM      EKG   EKG Interpretation  Date/Time:  Tuesday March 15 2022 15:50:25 EDT Ventricular Rate:  67 PR Interval:  126 QRS Duration: 92 QT Interval:  402 QTC Calculation: 424 R Axis:   16 Text Interpretation: Normal sinus rhythm Normal ECG When  compared with ECG of 06-Aug-2021 23:45, PREVIOUS ECG IS PRESENT Confirmed by Alvino Blood ([id number])  on 03/15/2022 3:51:59 PM         Imaging Studies ordered: I ordered imaging studies including femur On my interpretation imaging demonstrates no abscess. No gas. I independently visualized and interpreted imaging. I agree with the radiologist interpretation   Medicines ordered and prescription drug management: Meds ordered this encounter  Medications   cefTRIAXone (ROCEPHIN) 2 g in sodium chloride 0.9 % 100 mL IVPB    Order Specific Question:   Antibiotic Indication:    Answer:   Cellulitis   vancomycin (VANCOREADY) IVPB 2000 mg/400 mL    Order Specific Question:   Indication:    Answer:   Cellulitis   vancomycin (VANCOREADY) IVPB 1500 mg/300 mL    Order Specific Question:   Indication:    Answer:   Cellulitis   iohexol (OMNIPAQUE) 300 MG/ML solution 100 mL    -I have reviewed the patients home medicines and have made adjustments as needed   Consultations Obtained: I requested consultation with the hospitalist,  and discussed lab and imaging findings as well as pertinent plan - they recommend: admission    Social Determinants of Health:  Factors impacting patients care include: obesity   Reevaluation: After the interventions noted above, I reevaluated the patient and found that they have improved  Co morbidities that complicate the patient evaluation  Past Medical History:  Diagnosis Date   Asthma    Atrial fibrillation [facility])    Documented March 2021   Essential hypertension    History of nephrolithiasis    Hyperlipemia    Obesity    BMI 42.6   Psoriasis    Psoriatic arthritis (HCC)    Seasonal allergies    Sleep apnea       Dispostion: Admit    Final Clinical Impression(s) / ED Diagnoses Final diagnoses:  Cellulitis, unspecified cellulitis site     This chart was dictated using voice recognition software.  Despite best efforts to proofread,   errors can occur which can change the documentation meaning.    Lonell Grandchild, MD 03/15/22 2041

## 2022-03-15 NOTE — ED Triage Notes (Signed)
Pt presents with cellulitis to posterior upper left thigh, send at [facility] in [place] on Saturday given unknown injection, Abts Cephalexin, Bactrim, per pt infection isn't any better.

## 2022-03-16 ENCOUNTER — Encounter (HOSPITAL_COMMUNITY): Payer: Self-pay | Admitting: Family Medicine

## 2022-03-16 DIAGNOSIS — L03116 Cellulitis of left lower limb: Secondary | ICD-10-CM

## 2022-03-16 DIAGNOSIS — K219 Gastro-esophageal reflux disease without esophagitis: Secondary | ICD-10-CM

## 2022-03-16 DIAGNOSIS — E782 Mixed hyperlipidemia: Secondary | ICD-10-CM | POA: Diagnosis not present

## 2022-03-16 DIAGNOSIS — I10 Essential (primary) hypertension: Secondary | ICD-10-CM

## 2022-03-16 LAB — CBC WITH DIFFERENTIAL/PLATELET
Abs Immature Granulocytes: 0.41 10*3/uL — ABNORMAL HIGH (ref 0.00–0.07)
Basophils Absolute: 0.2 10*3/uL — ABNORMAL HIGH (ref 0.0–0.1)
Basophils Relative: 1 %
Eosinophils Absolute: 0.3 10*3/uL (ref 0.0–0.5)
Eosinophils Relative: 2 %
HCT: 34.5 % — ABNORMAL LOW (ref 39.0–52.0)
Hemoglobin: 11.3 g/dL — ABNORMAL LOW (ref 13.0–17.0)
Immature Granulocytes: 3 %
Lymphocytes Relative: 10 %
Lymphs Abs: 1.3 10*3/uL (ref 0.7–4.0)
MCH: 32.1 pg (ref 26.0–34.0)
MCHC: 32.8 g/dL (ref 30.0–36.0)
MCV: 98 fL (ref 80.0–100.0)
Monocytes Absolute: 1.3 10*3/uL — ABNORMAL HIGH (ref 0.1–1.0)
Monocytes Relative: 10 %
Neutro Abs: 10.1 10*3/uL — ABNORMAL HIGH (ref 1.7–7.7)
Neutrophils Relative %: 74 %
Platelets: 260 10*3/uL (ref 150–400)
RBC: 3.52 MIL/uL — ABNORMAL LOW (ref 4.22–5.81)
RDW: 17.2 % — ABNORMAL HIGH (ref 11.5–15.5)
WBC: 13.6 10*3/uL — ABNORMAL HIGH (ref 4.0–10.5)
nRBC: 0.2 % (ref 0.0–0.2)

## 2022-03-16 LAB — COMPREHENSIVE METABOLIC PANEL
ALT: 42 U/L (ref 0–44)
AST: 20 U/L (ref 15–41)
Albumin: 2.8 g/dL — ABNORMAL LOW (ref 3.5–5.0)
Alkaline Phosphatase: 63 U/L (ref 38–126)
Anion gap: 7 (ref 5–15)
BUN: 14 mg/dL (ref 6–20)
CO2: 22 mmol/L (ref 22–32)
Calcium: 8.1 mg/dL — ABNORMAL LOW (ref 8.9–10.3)
Chloride: 111 mmol/L (ref 98–111)
Creatinine, Ser: 0.92 mg/dL (ref 0.61–1.24)
GFR, Estimated: >60 mL/min (ref >60)
Glucose, Bld: 111 mg/dL — ABNORMAL HIGH (ref 70–99)
Potassium: 3.8 mmol/L (ref 3.5–5.1)
Sodium: 140 mmol/L (ref 135–145)
Total Bilirubin: 0.6 mg/dL (ref 0.3–1.2)
Total Protein: 5.7 g/dL — ABNORMAL LOW (ref 6.5–8.1)

## 2022-03-16 LAB — MRSA NEXT GEN BY PCR, NASAL: MRSA by PCR Next Gen: DETECTED — AB

## 2022-03-16 LAB — MAGNESIUM: Magnesium: 1.7 mg/dL (ref 1.7–2.4)

## 2022-03-16 MED ORDER — MUPIROCIN 2 % EX OINT
TOPICAL_OINTMENT | Freq: Two times a day (BID) | CUTANEOUS | Status: DC
  Filled 2022-03-16 (×2): qty 22

## 2022-03-16 MED ORDER — GABAPENTIN 300 MG PO CAPS
300.0000 mg | ORAL_CAPSULE | Freq: Two times a day (BID) | ORAL | Status: DC
  Administered 2022-03-16 – 2022-03-17 (×3): 300 mg via ORAL
  Filled 2022-03-16 (×2): qty 1

## 2022-03-16 NOTE — TOC Progression Note (Addendum)
  Transition of Care [facility]) Screening Note   Patient Details  Name: [name] [name] Date of Birth: Dec 20, 1960   Transition of Care [facility]) CM/SW Contact:    Boneta Lucks, RN Phone Number: 03/16/2022, 10:25 AM    Transition of Care Department [facility]) has reviewed patient and no TOC needs have been identified at this time. We will continue to monitor patient advancement through interdisciplinary progression rounds. If new patient transition needs arise, please place a TOC consult.       Barriers to Discharge: Continued Medical Work up  Expected Discharge Plan and Services         Living arrangements for the past 2 months: [place]

## 2022-03-16 NOTE — Assessment & Plan Note (Signed)
Continue lisinopril

## 2022-03-16 NOTE — Assessment & Plan Note (Signed)
Continue PPI ?

## 2022-03-16 NOTE — H&P (Signed)
History and Physical    Patient: [name] [name] ZOX:[id number] DOB: 1960/11/14 DOA: 03/15/2022 DOS: the patient was seen and examined on 03/16/2022 PCP: Andres Shad, MD  Patient coming from: Home  Chief Complaint:  Chief Complaint  Patient presents with   Wound Check   HPI: [name] [name] is a 61 y.o. male with medical history significant of obesity, hypertension, hyperlipidemia, psoriatic arthritis, sleep apnea on CPAP, and a one-time atrial fibrillation during a different hospitalization but patient is not on anticoagulation presents to the ED with a chief complaint of cellulitis.  Patient reports that he knew it was cellulitis because he had the same thing happened to him in February.  Patient reports this time it started last Thursday.  He had been getting in and out of the vehicle several times and thought that he just had chafing on the back of his left leg.  He used aloe on Friday and Saturday.  It became so painful that he could not walk.  He went to an outpatient doctor and was prescribed doxycycline.  They told him to go to the ER if it was not better by Tuesday.  It did start to improve from a pain standpoint, but he continued to be erythematous and blisters were forming.  Patient reports that he noted no drainage at home, but there was some drainage when the ER doc ruptured one of the blisters.  He denies any fever.  He reports it is very tender with palpation.  Better with Tylenol or oxy.  Standing on it does not really affect the pain.  Patient reports he has oxycodone at home because of his psoriatic arthritis and chronic pain in his left knee.  Patient has no further complaints at this time.  Patient does not smoke, does not drink alcohol, does not use illicit drugs.  He is not vaccinated for COVID.  Patient is full code. Review of Systems: As mentioned in the history of present illness. All other systems reviewed and are negative. Past Medical History:  Diagnosis Date   Asthma     Atrial fibrillation [facility])    Documented March 2021   Essential hypertension    History of nephrolithiasis    Hyperlipemia    Obesity    BMI 42.6   Psoriasis    Psoriatic arthritis ([place])    Seasonal allergies    Sleep apnea    Past Surgical History:  Procedure Laterality Date   NOSE SURGERY     VIDEO ASSISTED THORACOSCOPY (VATS)/DECORTICATION Right 03/20/2014   Procedure: VIDEO ASSISTED THORACOSCOPY (VATS)/DECORTICATION;  Surgeon: Ivin Poot, MD;  Location: [place];  Service: Thoracic;  Laterality: Right;   Social History:  reports that he has never smoked. He has never used smokeless tobacco. He reports that he does not drink alcohol and does not use drugs.  No Known Allergies  Family History  Problem Relation Age of Onset   Atrial fibrillation Mother     Prior to Admission medications   Medication Sig Start Date End Date Taking? Authorizing Provider  albuterol (VENTOLIN HFA) 108 (90 Base) MCG/ACT inhaler every 4 (four) hours as needed for shortness of breath or wheezing. 02/05/22  Yes [provider]  atorvastatin (LIPITOR) 80 MG tablet Take 80 mg by mouth daily. 04/18/19  Yes [provider]  Fluticasone-Salmeterol (ADVAIR) 250-50 MCG/DOSE AEPB Inhale 1 puff into the lungs 2 (two) times daily.   Yes [provider]  folic acid (FOLVITE) 1 MG tablet Take 1 mg by  mouth daily. 07/25/19  Yes [provider]  gabapentin (NEURONTIN) 600 MG tablet Take 600 mg by mouth 2 (two) times daily. 12/21/21  Yes [provider]  HYDROcodone-acetaminophen (NORCO) 10-325 MG tablet Take 1 tablet by mouth 4 (four) times daily. 03/02/22  Yes [provider]  lisinopril (ZESTRIL) 20 MG tablet Take 20 mg by mouth daily. 10/22/20  Yes [provider]  methotrexate (RHEUMATREX) 2.5 MG tablet Take 20 mg by mouth once a week. 07/25/19  Yes [provider]  montelukast (SINGULAIR) 10 MG tablet Take 10 mg by mouth at bedtime.   Yes [provider]  omeprazole (PRILOSEC) 40 MG capsule Take 1 capsule (40 mg total) by mouth daily. Patient taking differently: Take 20 mg by mouth daily. 08/19/19  Yes Johnson, Clanford L, MD  zolpidem (AMBIEN) 10 MG tablet Take 10 mg by mouth at bedtime as needed for sleep.   Yes [provider]  sildenafil (VIAGRA) 100 MG tablet Take 100 mg by mouth as directed. 12/27/21   [provider]    Physical Exam: Vitals:   03/15/22 2104 03/15/22 2158 03/15/22 2315 03/15/22 2342  BP: (!) 163/94  (!) 144/76 (!) 156/80  Pulse: 65   66  Resp: 19   17  Temp: (!) 97.1 F (36.2 C)   (!) 97.2 F (36.2 C)  TempSrc:      SpO2: 100% 97%  100%  Weight:      Height:       1.  General: Patient lying supine in bed,  no acute distress   2. Psychiatric: Alert and oriented x 3, mood and behavior normal for situation, pleasant and cooperative with exam   3. Neurologic: Speech and language are normal, face is symmetric, moves all 4 extremities voluntarily, at baseline without acute deficits on limited exam   4. HEENMT:  Head is atraumatic, normocephalic, pupils reactive to light, neck is supple, trachea is midline, mucous membranes are moist   5. Respiratory : Lungs are clear to auscultation bilaterally without wheezing, rhonchi, rales, no cyanosis, no increase in work of breathing or accessory muscle use   6. Cardiovascular : Heart rate normal, rhythm is regular, no murmurs, rubs or gallops, + peripheral edema, peripheral pulses palpated   7. Gastrointestinal:  Abdomen is soft, nondistended, nontender to palpation bowel sounds active, no masses or organomegaly palpated   8. Skin:  Erythematous and wet skin on the back of left thigh with ruptured blister about the size of a quarter, no palpable fluid pockets to drain   9.Musculoskeletal:  No acute deformities or trauma, no asymmetry in tone, +peripheral edema, peripheral pulses palpated, no tenderness to palpation in the  extremities  Data Reviewed: In the ED Temp 98.1, heart rate 67-78, respiratory rate 18-20, blood pressure 136/78-167/104, satting 96-100% Leukocytosis 16.2, hemoglobin stable at 12.3 Chemistries unremarkable Patient was started on Rocephin and vancomycin CT scan was done that showed no drainable fluid collection Admission was requested for cellulitis as patient has already failed doxycycline outpatient  Assessment and Plan: * Cellulitis - Leukocytosis at 16.2 - Erythematous and edematous left posterior thigh - Patient started on Rocephin and vancomycin in the ED - CT left femur showed subcutaneous edema in the lateral thigh with some accumulation along the superficial fascia margin there is also cutaneous and subcutaneous edema in the left medial thigh.  No drainable collection.  No bony findings. - Continue vancomycin - Blood cultures pending - Failed doxycycline outpatient - Continue to monitor  GERD (gastroesophageal reflux disease) - Continue PPI  HLD (hyperlipidemia) - Continue Lipitor  Hypertension - Continue lisinopril      Advance Care Planning:   Code Status: Full Code  Consults: None  Family Communication: No family at bedside  Severity of Illness: The appropriate patient status for this patient is INPATIENT. Inpatient status is judged to be reasonable and necessary in order to provide the required intensity of service to ensure the patient's safety. The patient's presenting symptoms, physical exam findings, and initial radiographic and laboratory data in the context of their chronic comorbidities is felt to place them at high risk for further clinical deterioration. Furthermore, it is not anticipated that the patient will be medically stable for discharge from the hospital within 2 midnights of admission.   * I certify that at the point of admission it is my clinical judgment that the patient will require inpatient hospital care spanning beyond 2 midnights from  the point of admission due to high intensity of service, high risk for further deterioration and high frequency of surveillance required.*  Author: Lilyan Gilford, DO 03/16/2022 3:54 AM  For on call review [email or web address].

## 2022-03-16 NOTE — Assessment & Plan Note (Signed)
-   Leukocytosis at 16.2 - Erythematous and edematous left posterior thigh - Patient started on Rocephin and vancomycin in the ED - CT left femur showed subcutaneous edema in the lateral thigh with some accumulation along the superficial fascia margin there is also cutaneous and subcutaneous edema in the left medial thigh.  No drainable collection.  No bony findings. - Continue vancomycin - Blood cultures pending - Failed doxycycline outpatient - Continue to monitor

## 2022-03-16 NOTE — Progress Notes (Signed)
[name] [name] is a 61 y.o. male with medical history significant of obesity, hypertension, hyperlipidemia, psoriatic arthritis, sleep apnea on CPAP, and a one-time atrial fibrillation during a different hospitalization but patient is not on anticoagulation presents to the ED with a chief complaint of cellulitis noted on his left posterior thigh.  He was started on vancomycin ID as he has not done well with doxycycline outpatient.  His blood cultures have no growth to date thus far and he is starting to feel somewhat better with improvement in his leukocytosis.  Patient seen and evaluated bedside and has been admitted after midnight.  Anticipate discharge in a.m. if further improved with plan to continue on oral antibiotics.  Follow a.m. labs and mark area of erythema.  Total care time: 30 minutes.

## 2022-03-16 NOTE — Assessment & Plan Note (Signed)
-  Continue Lipitor °

## 2022-03-16 NOTE — Progress Notes (Signed)
MRSA swab collected and sent to lab

## 2022-03-17 DIAGNOSIS — L03116 Cellulitis of left lower limb: Secondary | ICD-10-CM | POA: Diagnosis not present

## 2022-03-17 LAB — CBC
HCT: 40.1 % (ref 39.0–52.0)
Hemoglobin: 12.9 g/dL — ABNORMAL LOW (ref 13.0–17.0)
MCH: 32.2 pg (ref 26.0–34.0)
MCHC: 32.2 g/dL (ref 30.0–36.0)
MCV: 100 fL (ref 80.0–100.0)
Platelets: 221 10*3/uL (ref 150–400)
RBC: 4.01 MIL/uL — ABNORMAL LOW (ref 4.22–5.81)
RDW: 17.3 % — ABNORMAL HIGH (ref 11.5–15.5)
WBC: 13.9 10*3/uL — ABNORMAL HIGH (ref 4.0–10.5)
nRBC: 0.4 % — ABNORMAL HIGH (ref 0.0–0.2)

## 2022-03-17 LAB — BASIC METABOLIC PANEL
Anion gap: 10 (ref 5–15)
BUN: 17 mg/dL (ref 6–20)
CO2: 22 mmol/L (ref 22–32)
Calcium: 8.8 mg/dL — ABNORMAL LOW (ref 8.9–10.3)
Chloride: 111 mmol/L (ref 98–111)
Creatinine, Ser: 0.93 mg/dL (ref 0.61–1.24)
GFR, Estimated: >60 mL/min (ref >60)
Glucose, Bld: 97 mg/dL (ref 70–99)
Potassium: 4.3 mmol/L (ref 3.5–5.1)
Sodium: 143 mmol/L (ref 135–145)

## 2022-03-17 LAB — MAGNESIUM: Magnesium: 1.9 mg/dL (ref 1.7–2.4)

## 2022-03-17 MED ORDER — DOXYCYCLINE HYCLATE 100 MG PO CAPS: 100.0000 mg | ORAL_CAPSULE | Freq: Two times a day (BID) | ORAL | 0 refills | Status: AC

## 2022-03-17 NOTE — Discharge Summary (Signed)
Physician Discharge Summary  [name] [name] KNL:[id number] DOB: 22-Feb-1961 DOA: 03/15/2022  PCP: Arlina Robes, MD  Admit date: 03/15/2022  Discharge date: 03/17/2022  Admitted From:Home  Disposition:  Home  Recommendations for Outpatient Follow-up:  Follow up with PCP in 1-2 weeks Continue on doxycycline as prescribed for 10 more days to complete course of treatment Continue other home medications as prior  Home Health: None  Equipment/Devices: None  Discharge Condition:Stable  CODE STATUS: Full  Diet recommendation: Heart Healthy  Brief/Interim Summary: [name] [name] is a 61 y.o. male with medical history significant of obesity, hypertension, hyperlipidemia, psoriatic arthritis, sleep apnea on CPAP, and a one-time atrial fibrillation during a different hospitalization but patient is not on anticoagulation presents to the ED with a chief complaint of cellulitis noted on his left posterior thigh.  CT scan with no findings of fluid collections or other deep-seated infection.  He was started on vancomycin empirically and has shown some signs of improvement with less pain noted in his leg.  No fever or growth on blood cultures noted and his erythema is slightly improved.  He appears to be in stable condition for discharge today on oral doxycycline to continue for 10 more days to finish course of treatment.  He will need close follow-up with PCP to ensure that the cellulitis has fully resolved.  Discharge Diagnoses:  Principal Problem:   Cellulitis Active Problems:   Hypertension   HLD (hyperlipidemia)   GERD (gastroesophageal reflux disease)  Principal discharge diagnosis: Left posterior thigh cellulitis.  Discharge Instructions  Discharge Instructions     Diet - low sodium heart healthy   Complete by: As directed    Increase activity slowly   Complete by: As directed    No wound care   Complete by: As directed       Allergies as of 03/17/2022   No Known Allergies       Medication List     TAKE these medications    albuterol 108 (90 Base) MCG/ACT inhaler Commonly known as: VENTOLIN HFA every 4 (four) hours as needed for shortness of breath or wheezing.   atorvastatin 80 MG tablet Commonly known as: LIPITOR Take 80 mg by mouth daily.   doxycycline 100 MG capsule Commonly known as: VIBRAMYCIN Take 1 capsule (100 mg total) by mouth 2 (two) times daily for 10 days.   Fluticasone-Salmeterol 250-50 MCG/DOSE Aepb Commonly known as: ADVAIR Inhale 1 puff into the lungs 2 (two) times daily.   folic acid 1 MG tablet Commonly known as: FOLVITE Take 1 mg by mouth daily.   gabapentin 600 MG tablet Commonly known as: NEURONTIN Take 600 mg by mouth 2 (two) times daily.   HYDROcodone-acetaminophen 10-325 MG tablet Commonly known as: NORCO Take 1 tablet by mouth 4 (four) times daily.   lisinopril 20 MG tablet Commonly known as: ZESTRIL Take 20 mg by mouth daily.   methotrexate 2.5 MG tablet Commonly known as: RHEUMATREX Take 20 mg by mouth once a week.   montelukast 10 MG tablet Commonly known as: SINGULAIR Take 10 mg by mouth at bedtime.   omeprazole 40 MG capsule Commonly known as: PRILOSEC Take 1 capsule (40 mg total) by mouth daily. What changed: how much to take   sildenafil 100 MG tablet Commonly known as: VIAGRA Take 100 mg by mouth as directed.   zolpidem 10 MG tablet Commonly known as: AMBIEN Take 10 mg by mouth at bedtime as needed for sleep.  Follow-up Information     Arlina Robes, MD. Schedule an appointment as soon as possible for a visit in 1 week(s).   Specialty: Family Medicine Contact information: [address] [place] [address] [phone]                No Known Allergies  Consultations: None   Procedures/Studies: CT FEMUR LEFT W CONTRAST  Result Date: 03/15/2022 CLINICAL DATA:  Soft tissue infection. Pain and swelling in the thigh EXAM: CT OF THE LOWER LEFT EXTREMITY WITH  CONTRAST TECHNIQUE: Multidetector CT imaging of the lower left extremity was performed according to the standard protocol following intravenous contrast administration. RADIATION DOSE REDUCTION: This exam was performed according to the departmental dose-optimization program which includes automated exposure control, adjustment of the mA and/or kV according to patient size and/or use of iterative reconstruction technique. CONTRAST:  22mL OMNIPAQUE IOHEXOL 300 MG/ML  SOLN COMPARISON:  Overlapping portions CT lower leg 08/08/2021 FINDINGS: Bones/Joint/Cartilage No bony destructive findings characteristic of osteomyelitis. Bipartite patella. Osteoarthritis of the knee. 8 mm chronic free osteochondral fragment in the posterior intercondylar notch. Moderate knee effusion. No gas in the knee joint or large amount of synovitis noted. Baker's cyst containing a 9 mm free osteochondral fragment. Ligaments Suboptimally assessed by CT. Muscles and Tendons Unremarkable Soft tissues Mild sigmoid colon diverticulosis.  Fatty left spermatic cord. Subcutaneous edema in the lateral thigh with some accumulation along the superficial fascia margin for example on image 184 series 3. No drainable collection is identified. There is also a cutaneous and subcutaneous edema in the left medial thigh which is asymmetric compared to the right medial thigh for example on image 210 series 3. IMPRESSION: 1. Subcutaneous edema in the lateral thigh with some accumulation along the superficial fascia margin. There is also cutaneous and subcutaneous edema in the left medial thigh. No drainable collection is identified. 2. No bony destructive findings characteristic of osteomyelitis. 3. Moderate knee effusion. 4. Baker's cyst containing a 9 mm free osteochondral fragment. Also 8 mm chronic free osteochondral fragment in the posterior intercondylar notch. 5. Mild sigmoid colon diverticulosis. 6. Bipartite patella. 7. Osteoarthritis of the knee. 8. Fatty  left spermatic cord. Electronically Signed   By: Gaylyn Rong M.D.   On: 03/15/2022 19:03     Discharge Exam: Vitals:   03/17/22 0438 03/17/22 0820  BP: (!) 158/74   Pulse: (!) 50   Resp: 14   Temp: (!) 96.9 F (36.1 C)   SpO2: 98% 98%   Vitals:   03/16/22 1953 03/16/22 2006 03/17/22 0438 03/17/22 0820  BP:  (!) 141/79 (!) 158/74   Pulse:  (!) 56 (!) 50   Resp:  15 14   Temp:  (!) 97.2 F (36.2 C) (!) 96.9 F (36.1 C)   TempSrc:      SpO2: 99% 99% 98% 98%  Weight:      Height:        General: Pt is alert, awake, not in acute distress Cardiovascular: RRR, S1/S2 +, no rubs, no gallops Respiratory: CTA bilaterally, no wheezing, no rhonchi Abdominal: Soft, NT, ND, bowel sounds + Extremities: Left posterior thigh erythema improving    The results of significant diagnostics from this hospitalization (including imaging, microbiology, ancillary and laboratory) are listed below for reference.     Microbiology: Recent Results (from the past 240 hour(s))  Culture, blood (routine x 2)     Status: None (Preliminary result)   Collection Time: 03/15/22  4:34 PM   Specimen: Left  Antecubital; Blood  Result Value Ref Range Status   Specimen Description LEFT ANTECUBITAL  Final   Special Requests   Final    BOTTLES DRAWN AEROBIC AND ANAEROBIC Blood Culture results may not be optimal due to an excessive volume of blood received in culture bottles   Culture   Final    NO GROWTH 2 DAYS Performed at [facility], [address] [place], [place] [address]    Report Status PENDING  Incomplete  Culture, blood (routine x 2)     Status: None (Preliminary result)   Collection Time: 03/15/22  4:34 PM   Specimen: BLOOD RIGHT HAND  Result Value Ref Range Status   Specimen Description BLOOD RIGHT HAND  Final   Special Requests   Final    BOTTLES DRAWN AEROBIC AND ANAEROBIC Blood Culture adequate volume   Culture   Final    NO GROWTH 2 DAYS Performed at [facility], [address], [place], [place] [address]    Report Status PENDING  Incomplete  MRSA Next Gen by PCR, Nasal     Status: Abnormal   Collection Time: 03/16/22  6:48 AM   Specimen: Nasal Mucosa; Nasal Swab  Result Value Ref Range Status   MRSA by PCR Next Gen DETECTED (A) NOT DETECTED Final    Comment: RESULT CALLED TO, READ BACK BY AND VERIFIED WITH: COLEMAN L AT 49 ON [id number] BY THOMPSON S. (NOTE) The GeneXpert MRSA Assay (FDA approved for NASAL specimens only), is one component of a comprehensive MRSA colonization surveillance program. It is not intended to diagnose MRSA infection nor to guide or monitor treatment for MRSA infections. Test performance is not FDA approved in patients less than 10 years old. Performed at [facility], [address], [place], [place] [address]      Labs: BNP (last 3 results) No results for input(s): "BNP" in the last 8760 hours. Basic Metabolic Panel: Recent Labs  Lab 03/15/22 1538 03/16/22 0329 03/17/22 0257  NA 142 140 143  K 3.8 3.8 4.3  CL 112* 111 111  CO2 22 22 22   GLUCOSE 118* 111* 97  BUN 16 14 17   CREATININE 1.04 0.92 0.93  CALCIUM 8.7* 8.1* 8.8*  MG  --  1.7 1.9   Liver Function Tests: Recent Labs  Lab 03/16/22 0329  AST 20  ALT 42  ALKPHOS 63  BILITOT 0.6  PROT 5.7*  ALBUMIN 2.8*   No results for input(s): "LIPASE", "AMYLASE" in the last 168 hours. No results for input(s): "AMMONIA" in the last 168 hours. CBC: Recent Labs  Lab 03/15/22 1538 03/16/22 0329 03/17/22 0257  WBC 16.2* 13.6* 13.9*  NEUTROABS 13.6* 10.1*  --   HGB 12.3* 11.3* 12.9*  HCT 37.0* 34.5* 40.1  MCV 97.1 98.0 100.0  PLT 279 260 221   Cardiac Enzymes: No results for input(s): "CKTOTAL", "CKMB", "CKMBINDEX", "TROPONINI" in the last 168 hours. BNP: Invalid input(s): "POCBNP" CBG: No results for input(s): "GLUCAP" in the last 168 hours. D-Dimer No results for input(s): "DDIMER" in the last 72 hours. Hgb A1c No results for input(s): "HGBA1C" in the last  72 hours. Lipid Profile No results for input(s): "CHOL", "HDL", "LDLCALC", "TRIG", "CHOLHDL", "LDLDIRECT" in the last 72 hours. Thyroid function studies No results for input(s): "TSH", "T4TOTAL", "T3FREE", "THYROIDAB" in the last 72 hours.  Invalid input(s): "FREET3" Anemia work up No results for input(s): "VITAMINB12", "FOLATE", "FERRITIN", "TIBC", "IRON", "RETICCTPCT" in the last 72 hours. Urinalysis    Component Value Date/Time  COLORURINE YELLOW 12/11/2020 1826   APPEARANCEUR HAZY (A) 12/11/2020 1826   LABSPEC 1.017 12/11/2020 1826   PHURINE 5.0 12/11/2020 1826   GLUCOSEU NEGATIVE 12/11/2020 1826   HGBUR NEGATIVE 12/11/2020 1826   BILIRUBINUR NEGATIVE 12/11/2020 1826   KETONESUR 20 (A) 12/11/2020 1826   PROTEINUR 30 (A) 12/11/2020 1826   UROBILINOGEN 0.2 03/14/2014 0100   NITRITE NEGATIVE 12/11/2020 1826   LEUKOCYTESUR NEGATIVE 12/11/2020 1826   Sepsis Labs Recent Labs  Lab 03/15/22 1538 03/16/22 0329 03/17/22 0257  WBC 16.2* 13.6* 13.9*   Microbiology Recent Results (from the past 240 hour(s))  Culture, blood (routine x 2)     Status: None (Preliminary result)   Collection Time: 03/15/22  4:34 PM   Specimen: Left Antecubital; Blood  Result Value Ref Range Status   Specimen Description LEFT ANTECUBITAL  Final   Special Requests   Final    BOTTLES DRAWN AEROBIC AND ANAEROBIC Blood Culture results may not be optimal due to an excessive volume of blood received in culture bottles   Culture   Final    NO GROWTH 2 DAYS Performed at [facility], [address], [place], [place] [address]    Report Status PENDING  Incomplete  Culture, blood (routine x 2)     Status: None (Preliminary result)   Collection Time: 03/15/22  4:34 PM   Specimen: BLOOD RIGHT HAND  Result Value Ref Range Status   Specimen Description BLOOD RIGHT HAND  Final   Special Requests   Final    BOTTLES DRAWN AEROBIC AND ANAEROBIC Blood Culture adequate volume   Culture   Final    NO GROWTH 2  DAYS Performed at [facility], [address] [place], [place] [address]    Report Status PENDING  Incomplete  MRSA Next Gen by PCR, Nasal     Status: Abnormal   Collection Time: 03/16/22  6:48 AM   Specimen: Nasal Mucosa; Nasal Swab  Result Value Ref Range Status   MRSA by PCR Next Gen DETECTED (A) NOT DETECTED Final    Comment: RESULT CALLED TO, READ BACK BY AND VERIFIED WITH: COLEMAN L AT 1410 ON [id number] BY THOMPSON S. (NOTE) The GeneXpert MRSA Assay (FDA approved for NASAL specimens only), is one component of a comprehensive MRSA colonization surveillance program. It is not intended to diagnose MRSA infection nor to guide or monitor treatment for MRSA infections. Test performance is not FDA approved in patients less than 68 years old. Performed at [facility], [address], [place], [place] [address]      Time coordinating discharge: 35 minutes  SIGNED:   Erick Blinks, DO [facility] 03/17/2022, 9:05 AM  If 7PM-7AM, please contact night-coverage [email or web address]

## 2022-03-20 LAB — CULTURE, BLOOD (ROUTINE X 2)
Culture: NO GROWTH
Culture: NO GROWTH
Special Requests: ADEQUATE

## 2022-05-31 ENCOUNTER — Other Ambulatory Visit: Payer: Self-pay | Admitting: Neurological Surgery

## 2022-06-16 NOTE — Progress Notes (Signed)
Surgical Instructions    Your procedure is scheduled on Thursday, January 11th, 2024.   Report to [facility] Main Entrance "A" at 05:30 A.M., then check in with the Admitting office.  Call this number if you have problems the morning of surgery:  [phone]   If you have any questions prior to your surgery date call [phone]: Open Monday-Friday 8am-4pm If you experience any cold or flu symptoms such as cough, fever, chills, shortness of breath, etc. between now and your scheduled surgery, please notify us at the above number     Remember:  Do not eat after midnight the night before your surgery  You may drink clear liquids until 04:30 the morning of your surgery.   Clear liquids allowed are: Water, Non-Citrus Juices (without pulp), Carbonated Beverages, Clear Tea, Black Coffee ONLY (NO MILK, CREAM OR POWDERED CREAMER of any kind), and Gatorade    Take these medicines the morning of surgery with A SIP OF WATER:   atorvastatin (LIPITOR)  gabapentin (NEURONTIN)  omeprazole (PRILOSEC)  Fluticasone-Salmeterol (ADVAIR)   As needed:  HYDROcodone-acetaminophen (NORCO)   Please bring all inhalers with you the day of surgery.    Ask MD if you need to hold Upadacitinib ER [facility]) prior to surgery   As of today, STOP taking any Aspirin (unless otherwise instructed by your surgeon) Aleve, Naproxen, Ibuprofen, Motrin, Advil, Goody's, BC's, all herbal medications, fish oil, and all vitamins.  The day of surgery:           Do not wear jewelry  Do not wear lotions, powders, cologne or deodorant. Men may shave face and neck. Do not bring valuables to the hospital. Do not wear nail polish, gel polish, artificial nails, or any other type of covering on natural nails (fingers and toes)  [facility] is not responsible for any belongings or valuables.    Do NOT Smoke (Tobacco/Vaping)  24 hours prior to your procedure  If you use a CPAP at night, you may bring your mask for your  overnight stay.   Contacts, glasses, hearing aids, dentures or partials may not be worn into surgery, please bring cases for these belongings   For patients admitted to the hospital, discharge time will be determined by your treatment team.   Patients discharged the day of surgery will not be allowed to drive home, and someone needs to stay with them for 24 hours.   SURGICAL WAITING ROOM VISITATION Patients having surgery or a procedure may have no more than 2 support people in the waiting area - these visitors may rotate.   Children under the age of 40 must have an adult with them who is not the patient. If the patient needs to stay at the hospital during part of their recovery, the visitor guidelines for inpatient rooms apply. Pre-op nurse will coordinate an appropriate time for 1 support person to accompany patient in pre-op.  This support person may not rotate.   Please refer to [email or web address] for the visitor guidelines for Inpatients (after your surgery is over and you are in a regular room).    Special instructions:    Oral Hygiene is also important to reduce your risk of infection.  Remember - BRUSH YOUR TEETH THE MORNING OF SURGERY WITH YOUR REGULAR TOOTHPASTE   [place]- Preparing For Surgery  Before surgery, you can play an important role. Because skin is not sterile, your skin needs to be as free of germs as possible. You can reduce the number of  germs on your skin by washing with CHG (chlorahexidine gluconate) Soap before surgery.  CHG is an antiseptic cleaner which kills germs and bonds with the skin to continue killing germs even after washing.     Please do not use if you have an allergy to CHG or antibacterial soaps. If your skin becomes reddened/irritated stop using the CHG.  Do not shave (including legs and underarms) for at least 48 hours prior to first CHG shower. It is OK to shave your face.  Please follow  these instructions carefully.     Shower the NIGHT BEFORE SURGERY and the MORNING OF SURGERY with CHG Soap.   If you chose to wash your hair, wash your hair first as usual with your normal shampoo. After you shampoo, rinse your hair and body thoroughly to remove the shampoo.  Then [employer] and genitals (private parts) with your normal soap and rinse thoroughly to remove soap.  After that Use CHG Soap as you would any other liquid soap. You can apply CHG directly to the skin and wash gently with a scrungie or a clean washcloth.   Apply the CHG Soap to your body ONLY FROM THE NECK DOWN.  Do not use on open wounds or open sores. Avoid contact with your eyes, ears, mouth and genitals (private parts). Wash Face and genitals (private parts)  with your normal soap.   Wash thoroughly, paying special attention to the area where your surgery will be performed.  Thoroughly rinse your body with warm water from the neck down.  DO NOT shower/wash with your normal soap after using and rinsing off the CHG Soap.  Pat yourself dry with a CLEAN TOWEL.  Wear CLEAN PAJAMAS to bed the night before surgery  Place CLEAN SHEETS on your bed the night before your surgery  DO NOT SLEEP WITH PETS.   Day of Surgery:  Take a shower with CHG soap. Wear Clean/Comfortable clothing the morning of surgery Do not apply any deodorants/lotions.   Remember to brush your teeth WITH YOUR REGULAR TOOTHPASTE.    If you received a COVID test during your pre-op visit, it is requested that you wear a mask when out in public, stay away from anyone that may not be feeling well, and notify your surgeon if you develop symptoms. If you have been in contact with anyone that has tested positive in the last 10 days, please notify your surgeon.    Please read over the following fact sheets that you were given.

## 2022-06-17 ENCOUNTER — Encounter (HOSPITAL_COMMUNITY): Payer: Self-pay

## 2022-06-17 ENCOUNTER — Encounter (HOSPITAL_COMMUNITY)
Admission: RE | Admit: 2022-06-17 | Discharge: 2022-06-17 | Disposition: A | Discharge status: Home / Self Care | Payer: BC Managed Care – PPO | Source: Ambulatory Visit | Attending: Neurological Surgery | Admitting: Neurological Surgery

## 2022-06-17 ENCOUNTER — Other Ambulatory Visit: Payer: Self-pay

## 2022-06-17 VITALS — BP 148/91 | HR 73 | Temp 98.3°F | Resp 18 | Ht 69.0 in | Wt 315.9 lb

## 2022-06-17 DIAGNOSIS — G4733 Obstructive sleep apnea (adult) (pediatric): Secondary | ICD-10-CM | POA: Diagnosis not present

## 2022-06-17 DIAGNOSIS — I4891 Unspecified atrial fibrillation: Secondary | ICD-10-CM | POA: Diagnosis not present

## 2022-06-17 DIAGNOSIS — Z01818 Encounter for other preprocedural examination: Secondary | ICD-10-CM

## 2022-06-17 DIAGNOSIS — E785 Hyperlipidemia, unspecified: Secondary | ICD-10-CM | POA: Diagnosis not present

## 2022-06-17 DIAGNOSIS — J189 Pneumonia, unspecified organism: Secondary | ICD-10-CM | POA: Diagnosis not present

## 2022-06-17 DIAGNOSIS — I119 Hypertensive heart disease without heart failure: Secondary | ICD-10-CM | POA: Diagnosis not present

## 2022-06-17 DIAGNOSIS — L4050 Arthropathic psoriasis, unspecified: Secondary | ICD-10-CM | POA: Diagnosis not present

## 2022-06-17 DIAGNOSIS — K219 Gastro-esophageal reflux disease without esophagitis: Secondary | ICD-10-CM | POA: Diagnosis not present

## 2022-06-17 DIAGNOSIS — J45909 Unspecified asthma, uncomplicated: Secondary | ICD-10-CM | POA: Insufficient documentation

## 2022-06-17 DIAGNOSIS — Z6841 Body Mass Index (BMI) 40.0 and over, adult: Secondary | ICD-10-CM | POA: Insufficient documentation

## 2022-06-17 DIAGNOSIS — M4316 Spondylolisthesis, lumbar region: Secondary | ICD-10-CM | POA: Insufficient documentation

## 2022-06-17 DIAGNOSIS — Z01812 Encounter for preprocedural laboratory examination: Secondary | ICD-10-CM | POA: Diagnosis present

## 2022-06-17 HISTORY — DX: Personal history of urinary calculi: Z87.442

## 2022-06-17 HISTORY — DX: Pneumonia, unspecified organism: J18.9

## 2022-06-17 HISTORY — DX: Gastro-esophageal reflux disease without esophagitis: K21.9

## 2022-06-17 LAB — BASIC METABOLIC PANEL
Anion gap: 8 (ref 5–15)
BUN: 19 mg/dL (ref 6–20)
CO2: 26 mmol/L (ref 22–32)
Calcium: 8.7 mg/dL — ABNORMAL LOW (ref 8.9–10.3)
Chloride: 106 mmol/L (ref 98–111)
Creatinine, Ser: 1.08 mg/dL (ref 0.61–1.24)
GFR, Estimated: >60 mL/min (ref >60)
Glucose, Bld: 123 mg/dL — ABNORMAL HIGH (ref 70–99)
Potassium: 3.6 mmol/L (ref 3.5–5.1)
Sodium: 140 mmol/L (ref 135–145)

## 2022-06-17 LAB — CBC
HCT: 42.5 % (ref 39.0–52.0)
Hemoglobin: 14.1 g/dL (ref 13.0–17.0)
MCH: 31.5 pg (ref 26.0–34.0)
MCHC: 33.2 g/dL (ref 30.0–36.0)
MCV: 94.9 fL (ref 80.0–100.0)
Platelets: 301 10*3/uL (ref 150–400)
RBC: 4.48 MIL/uL (ref 4.22–5.81)
RDW: 16.3 % — ABNORMAL HIGH (ref 11.5–15.5)
WBC: 12.9 10*3/uL — ABNORMAL HIGH (ref 4.0–10.5)
nRBC: 0 % (ref 0.0–0.2)

## 2022-06-17 LAB — TYPE AND SCREEN
ABO/RH(D): O POS
Antibody Screen: NEGATIVE

## 2022-06-17 LAB — SURGICAL PCR SCREEN
MRSA, PCR: POSITIVE — AB
Staphylococcus aureus: POSITIVE — AB

## 2022-06-17 NOTE — Progress Notes (Addendum)
PCP - Dr.Albert Danne Baxter, [place]) Cardiologist - Levell July, NP last OV 03/26/21  PPM/ICD - pt denies Device Orders - n/a Rep Notified - n/a  Chest x-ray - 08/16/2019 EKG - 03/16/2022 Stress Test - pt denies "walking on treadmill"   ECHO - 09/27/2019 Cardiac Cath - pt denies  Sleep Study - yes CPAP - setting 6-21 per pt  Fasting Blood Sugar - pt denies Diabetes  Checks Blood Sugar _____ times a day  Last dose of GLP1 agonist-  pt denies GLP1 instructions: n/a  Blood Thinner Instructions:pt denies Aspirin Instructions:pt denies  ERAS Protcol -yes PRE-SURGERY Ensure or G2- n/a  COVID TEST- n/a   Anesthesia review:YES, Atrial fib history due to side effect of having Covid March 2021 per patient. Patient denies any cardiac symptoms since that time.   Patient denies shortness of breath, fever, cough and chest pain at PAT appointment   All instructions explained to the patient, with a verbal understanding of the material. Patient agrees to go over the instructions while at home for a better understanding. Patient also instructed to self quarantine after being tested for COVID-19. The opportunity to ask questions was provided.

## 2022-06-20 ENCOUNTER — Encounter (HOSPITAL_COMMUNITY): Payer: Self-pay | Admitting: Physician Assistant

## 2022-06-20 ENCOUNTER — Encounter (HOSPITAL_COMMUNITY): Payer: Self-pay | Admitting: Vascular Surgery

## 2022-06-20 NOTE — Progress Notes (Signed)
Anesthesia Chart Review:  Case: [id number] Date/Time: 06/23/22 0715   Procedures:      Open L4-5 Laminectomy/Synovial cyst resection/TLIF/Posterolateral instrumented fusion - 3C/RM 19     Application of O-Arm   Anesthesia type: General   Pre-op diagnosis: Spondylolisthesis, Lumbar region   [facility]: [facility] ROOM [facility] / [facility]   Surgeons: Jadene Pierini, MD       DISCUSSION: Patient is a 62 year old male scheduled for the above procedure.  History includes never smoker, asthma, OSA (uses CPAP), psoriasis/psoriatic arthritis, HTN, HLD, GERD, RLL CAP PNA/empyema (s/p right VATS/decortication 03/20/14), afib with RVR with secondary cardiomyopathy (08/2019 in setting of COVID-19; notes also suggest aflutter at 109 bpm on EKG for triceps repair in 01/2021; patient opted not to take Eliquis due to history of significant nose bleeds, preferred ASA). BMI is consistent with morbid obesity.   [place] admission 03/15/22-03/17/22 for left thigh cellulitis. CT scan with no findings of fluid collections or other deep-seated infection.  He showed improvement with empiric vancomycin, and was discharged home on oral doxycyline for 10 more days.   Last cardiology visit was on 03/26/21 with Rennis Harding, NP. EKG showed NSR then. CHADS2-Vasc 1. Patient was no longer taking Eliquis due to significant nose bleeds. Preferred to take ASA 81 mg. Follow-up echo 09/2019 showed recovery of EF from 45-50% with global LV hypokinesis to 55-60% with normal wall motion, moderate concentric LVH, trivial MR. Six month follow-up planned.    VS: BP (!) 148/91   Pulse 73   Temp 36.8 C (Oral)   Resp 18   Ht 5\' 9"  (1.753 m)   Wt (!) 143.3 kg   SpO2 100%   BMI 46.65 kg/m    PROVIDERS: , MD is PCP Arlina Robes, Octavio Manns) [place], MD is cardiologist   LABS: Preoperative labs noted. A1c 5.7% 08/08/21.  (all labs ordered are listed, but only abnormal results are displayed)  Labs Reviewed  SURGICAL PCR  SCREEN - Abnormal; Notable for the following components:      Result Value   MRSA, PCR POSITIVE (*)    Staphylococcus aureus POSITIVE (*)    All other components within normal limits  BASIC METABOLIC PANEL - Abnormal; Notable for the following components:   Glucose, Bld 123 (*)    Calcium 8.7 (*)    All other components within normal limits  CBC - Abnormal; Notable for the following components:   WBC 12.9 (*)    RDW 16.3 (*)    All other components within normal limits  TYPE AND SCREEN     IMAGES: CT Left Femur 03/15/22: IMPRESSION: 1. Subcutaneous edema in the lateral thigh with some accumulation along the superficial fascia margin. There is also cutaneous and subcutaneous edema in the left medial thigh. No drainable collection is identified. 2. No bony destructive findings characteristic of osteomyelitis. 3. Moderate knee effusion. 4. Baker's cyst containing a 9 mm free osteochondral fragment. Also 8 mm chronic free osteochondral fragment in the posterior intercondylar notch. 5. Mild sigmoid colon diverticulosis. 6. Bipartite patella. 7. Osteoarthritis of the knee. 8. Fatty left spermatic cord.  MRI L-spine 12/23/21: Images can be viewed in [facility]/PACS.   EKG: 03/15/22: NSR   CV: RLE Venous 05/15/22 08/08/2021 IMPRESSION: Suboptimal evaluation, within these constraints; No evidence of femoropopliteal DVT within the RIGHT lower extremity.  Echocardiogram 09/27/2019  1. Left ventricular ejection fraction, by estimation, is 55 to 60%. The  left ventricle has normal function. The left ventricle has  no regional  wall motion abnormalities. There is moderate concentric left ventricular  hypertrophy. Left ventricular  diastolic parameters were normal.   2. Right ventricular systolic function is normal. The right ventricular  size is normal.   3. The mitral valve is grossly normal. Trivial mitral valve  regurgitation.   4. The aortic valve is tricuspid. Aortic valve  regurgitation is not  visualized. No aortic stenosis is present.   5. The inferior vena cava is normal in size with greater than 50%  respiratory variability, suggesting right atrial pressure of 3 mmHg.  - Comparison echo 08/17/19: LVEF 45-50% with global LV hypokinesis in setting of afib with RVR and COVID-19.     Past Medical History:  Diagnosis Date   Asthma    Atrial fibrillation [facility])    Documented March 2021; pt states this was during Covid +, side effect   Essential hypertension    GERD (gastroesophageal reflux disease)    History of kidney stones    History of nephrolithiasis    Hyperlipemia    Obesity    BMI 42.6   Pneumonia    8 years ago   Psoriasis    Psoriatic arthritis ([place])    Seasonal allergies    Sleep apnea     Past Surgical History:  Procedure Laterality Date   CATARACT EXTRACTION, BILATERAL Bilateral 05/2021   COLONOSCOPY     NOSE SURGERY     TRICEPS TENDON REPAIR Right    VIDEO ASSISTED THORACOSCOPY (VATS)/DECORTICATION Right 03/20/2014   Procedure: VIDEO ASSISTED THORACOSCOPY (VATS)/DECORTICATION;  Surgeon: Ivin Poot, MD;  [facility];  Service: Thoracic;  Laterality: Right;    MEDICATIONS:  ascorbic acid (VITAMIN C) 500 MG tablet   atorvastatin (LIPITOR) 80 MG tablet   Cholecalciferol (VITAMIN D3) 125 MCG (5000 UT) CAPS   Fluticasone-Salmeterol (ADVAIR) 250-50 MCG/DOSE AEPB   folic acid (FOLVITE) 1 MG tablet   gabapentin (NEURONTIN) 600 MG tablet   HYDROcodone-acetaminophen (NORCO) 10-325 MG tablet   lisinopril (ZESTRIL) 20 MG tablet   methotrexate (RHEUMATREX) 2.5 MG tablet   montelukast (SINGULAIR) 10 MG tablet   omeprazole (PRILOSEC) 20 MG capsule   sildenafil (VIAGRA) 100 MG tablet   Upadacitinib ER (RINVOQ) 15 MG TB24   zolpidem (AMBIEN) 10 MG tablet   No current facility-administered medications for this encounter.

## 2022-06-22 ENCOUNTER — Telehealth: Payer: Self-pay | Admitting: *Deleted

## 2022-06-22 NOTE — Telephone Encounter (Signed)
   Name: [name] [name]  DOB: 10/05/1960  MRN: [id number]  Primary Cardiologist: Rozann Lesches, MD  Chart reviewed as part of pre-operative protocol coverage. Because of [name] [name]'s past medical history and time since last visit, he will require a follow-up in-office visit in order to better assess preoperative cardiovascular risk.  Pre-op covering staff: - Please schedule appointment and call patient to inform them. If patient already had an upcoming appointment within acceptable timeframe, please add "pre-op clearance" to the appointment notes so provider is aware. - Please contact requesting surgeon's office via preferred method (i.e, phone, fax) to inform them of need for appointment prior to surgery.  No medications indicated as needing held.  Elgie Collard, PA-C  06/22/2022, 1:42 PM

## 2022-06-22 NOTE — Telephone Encounter (Signed)
S/w pt was very irate which is understandable.  Pt stated is to be at surgeons tomorrow at 5.  Pt stated does not have a heart issue. Stated would call pt back.  [place], [place] to t/w pt did not answer phone. Went to Janan Halter, RN, [employer], [employer]. Told pt if did not have heart issue needs to call surgeons office and PCP to get cleared per [facility].

## 2022-06-22 NOTE — Telephone Encounter (Signed)
   Pre-operative Risk Assessment    Patient Name: [name] [name]  DOB: 07-Aug-1960 MRN: [id number]      Request for Surgical Clearance    Procedure:   Open L4-5 Laminectomy, synovial cyst resection, Transforaminal lumbar interbody fusion, Posterior instrumented fusion  Date of Surgery:  Clearance 06/23/22                                 Surgeon:  Dr. Emelda Brothers Surgeon's Group or Practice Name:  [facility] Phone number:  [phone] Fax number:  [phone]   Type of Clearance Requested:   - Medical    Type of Anesthesia:  General    Additional requests/questions:    Signed, Greer Ee   06/22/2022, 1:10 PM

## 2022-06-22 NOTE — Telephone Encounter (Signed)
S/w pt did talk with surgeons office and surgery was canceled for tomorrow due to pt needing cardiac surgical clearance.   Pt is scheduled for [facility] Razor on Monday, January 15.

## 2022-06-23 ENCOUNTER — Ambulatory Visit (HOSPITAL_COMMUNITY)
Admission: RE | Admit: 2022-06-23 | Payer: BC Managed Care – PPO | Source: Ambulatory Visit | Admitting: Neurological Surgery

## 2022-06-23 SURGERY — TRANSFORAMINAL LUMBAR INTERBODY FUSION (TLIF) WITH PEDICLE SCREW FIXATION 1 LEVEL
Anesthesia: General

## 2022-06-27 ENCOUNTER — Encounter: Payer: Self-pay | Admitting: Physician Assistant

## 2022-06-27 ENCOUNTER — Ambulatory Visit: Payer: BC Managed Care – PPO | Attending: Physician Assistant | Admitting: Physician Assistant

## 2022-06-27 VITALS — BP 150/90 | HR 93 | Ht 69.0 in | Wt 312.2 lb

## 2022-06-27 DIAGNOSIS — I429 Cardiomyopathy, unspecified: Secondary | ICD-10-CM

## 2022-06-27 DIAGNOSIS — I10 Essential (primary) hypertension: Secondary | ICD-10-CM

## 2022-06-27 DIAGNOSIS — I4891 Unspecified atrial fibrillation: Secondary | ICD-10-CM

## 2022-06-27 DIAGNOSIS — E782 Mixed hyperlipidemia: Secondary | ICD-10-CM | POA: Diagnosis not present

## 2022-06-27 NOTE — Progress Notes (Signed)
Office Visit    Patient Name: [name] [name] Date of Encounter: 06/27/2022  PCP:  Arlina Robes, MD   [facility]  Cardiologist:  Nona Dell, MD  Advanced Practice Provider:  No care team member to display Electrophysiologist:  None   HPI    [name] [name] is a 62 y.o. male with past medical history significant for atrial fibrillation, hypertension, sleep apnea, DM 2, anticoagulopathy, obesity, and HLD presents today for overdue follow-up visit and preop clearance.  He was seen by Dr. Diona Browner 09/19/2019 for consultation by Dr. Laural Benes after her hospitalization the previous year with COVID-19 and newly documented atrial fibrillation.  He did not have any chest pain or palpitations.  Heart rate was in the 60s 70s at home.  Echo March 2021 with EF 45 to 50% with global hypokinesis, no significant valvular abnormalities.  TMJ score was 3, hemoglobin A1c 7.6.  He was regularly followed by his PCP Dr. Merleen Milliner.  He has a documented CHA2DS2-VASc score of 3.  Was placed on Eliquis for stroke prophylaxis and metoprolol by Dr. Laural Benes.  He was in normal sinus rhythm by EKG during that visit.  Dr. Diona Browner mentioned he would likely convert over the last few weeks based on reported heart rates at home.  He did not recall any palpitations when he was in atrial fibrillation.  At that point the plan was to continue current medical therapy and reevaluate over the following months.  Dr. Diona Browner mentioned that this may be a situation where he could stop anticoagulation.  Plan was to follow-up in March with an echocardiogram and reassess EF.  Follow-up echocardiogram 09/27/2019 showed return of LVEF back to normal 55 to 60%.  Had moderate concentric LVH, normal diastolic parameters, normal RV function, trivial MR.  Seen in follow-up 03/2021 and denied further issues with atrial fibrillation or sensation of palpitation/racing heartbeats or skipped sensations in the chest.  EKG showed  normal sinus rhythm rate 75 bpm.  He had been off Eliquis for some time at that point without any issues.  Blood pressure was slightly elevated at that time.  There was a documented episode on EKG of atrial fibrillation 02/02/2021.  He was off of his Eliquis at his last appointment due to significant nosebleeds.  Risks and benefits of not taking anticoagulation was discussed.  He was started on ASA 81 mg.  Not on a beta-blocker at that time.  CHA2DS2-VASc score was 1.  Today, he tells me that he started seeing cardiology back in 2021 when he had COVID.  He had atrial fibrillation at this time.  He did not have any recurrent episodes and his echocardiogram looked good so he was told not to follow-up.  However, then he states that he needed to follow-up on a yearly basis.  He shares with me that he has had a very difficult year which started with him rupturing his tricep and then having back to back cellulitis episodes on his leg.  He speaks very highly of the wound clinic in [place].  In addition to back surgery he also needs to get his knee replaced but he is working on decreasing his BMI.  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  He enjoys cycling and has done as much is 62 miles on the road at 1 time.  Although, he states he has not cycled in a while. No chest pain but some SOB but due to back  pain.  He is already 5.62 METS on the DASI.  This exceeds the minimum 4 METS requirement.  Past Medical History    Past Medical History:  Diagnosis Date   Asthma    Atrial fibrillation [facility])    Documented March 2021; pt states this was during Covid +, side effect   Essential hypertension    GERD (gastroesophageal reflux disease)    History of kidney stones    History of nephrolithiasis    Hyperlipemia    Obesity    BMI 42.6   Pneumonia    8 years ago   Psoriasis    Psoriatic arthritis ([place])    Seasonal allergies    Sleep apnea    Past Surgical History:  Procedure  Laterality Date   CATARACT EXTRACTION, BILATERAL Bilateral 05/2021   COLONOSCOPY     NOSE SURGERY     TRICEPS TENDON REPAIR Right    VIDEO ASSISTED THORACOSCOPY (VATS)/DECORTICATION Right 03/20/2014   Procedure: VIDEO ASSISTED THORACOSCOPY (VATS)/DECORTICATION;  Surgeon: Ivin Poot, MD;  Location: [facility];  Service: Thoracic;  Laterality: Right;    Allergies  No Known Allergies  EKGs/Labs/Other Studies Reviewed:   The following studies were reviewed today:   Echocardiogram 09/27/2019  1. Left ventricular ejection fraction, by estimation, is 55 to 60%. The  left ventricle has normal function. The left ventricle has no regional  wall motion abnormalities. There is moderate concentric left ventricular  hypertrophy. Left ventricular  diastolic parameters were normal.   2. Right ventricular systolic function is normal. The right ventricular  size is normal.   3. The mitral valve is grossly normal. Trivial mitral valve  regurgitation.   4. The aortic valve is tricuspid. Aortic valve regurgitation is not  visualized. No aortic stenosis is present.   5. The inferior vena cava is normal in size with greater than 50%  respiratory variability, suggesting right atrial pressure of 3 mmHg.      Echocardiogram 08/17/2019:  1. Left ventricular ejection fraction, by estimation, is 45 to 50%. The  left ventricle has mildly decreased function. The left ventricle  demonstrates global hypokinesis. There is mild concentric left ventricular  hypertrophy. Left ventricular diastolic  function could not be evaluated. Left ventricular diastolic function could  not be evaluated.   2. Right ventricular systolic function is normal. The right ventricular  size is normal.   3. The mitral valve is normal in structure and function. Trivial mitral  valve regurgitation. No evidence of mitral stenosis.   4. The aortic valve is tricuspid. Aortic valve regurgitation is not  visualized. No aortic stenosis is  present.   5. The inferior vena cava is normal in size with greater than 50%  respiratory variability, suggesting right atrial pressure of 3 mmHg.   EKG:  EKG is  ordered today.  The ekg ordered today demonstrates normal sinus rhythm, rate 93 bpm with LVH.  Recent Labs: 03/16/2022: ALT 42 03/17/2022: Magnesium 1.9 06/17/2022: BUN 19; Creatinine, Ser 1.08; Hemoglobin 14.1; Platelets 301; Potassium 3.6; Sodium 140  Recent Lipid Panel    Component Value Date/Time   CHOL 144 08/17/2019 0606   TRIG 92 08/17/2019 0606   HDL 32 (L) 08/17/2019 0606   CHOLHDL 4.5 08/17/2019 0606   VLDL 18 08/17/2019 0606   LDLCALC 94 08/17/2019 0606    Risk Assessment/Calculations:   CHA2DS2-VASc Score = 1   This indicates a 0.6% annual risk of stroke. The patient's score is based upon: CHF History: 0 HTN History: 1  Diabetes History: 0 Stroke History: 0 Vascular Disease History: 0 Age Score: 0 Gender Score: 0      Home Medications   Current Meds  Medication Sig   ascorbic acid (VITAMIN C) 500 MG tablet Take 500 mg by mouth daily.   atorvastatin (LIPITOR) 80 MG tablet Take 40 mg by mouth daily.   Cholecalciferol (VITAMIN D3) 125 MCG (5000 UT) CAPS Take 5,000 Units by mouth daily.   diclofenac (VOLTAREN) 75 MG EC tablet Take 75 mg by mouth as needed for moderate pain.   Fluticasone-Salmeterol (ADVAIR) 250-50 MCG/DOSE AEPB Inhale 1 puff into the lungs 2 (two) times daily.   folic acid (FOLVITE) 1 MG tablet Take 1 mg by mouth daily.   gabapentin (NEURONTIN) 600 MG tablet Take 600 mg by mouth 2 (two) times daily.   HYDROcodone-acetaminophen (NORCO) 10-325 MG tablet Take 1 tablet by mouth every 4 (four) hours as needed for severe pain.   lisinopril (ZESTRIL) 20 MG tablet Take 20 mg by mouth daily.   methotrexate (RHEUMATREX) 2.5 MG tablet Take 20 mg by mouth every Monday.   montelukast (SINGULAIR) 10 MG tablet Take 10 mg by mouth at bedtime.   omeprazole (PRILOSEC) 20 MG capsule Take 20 mg by mouth  daily.   sildenafil (VIAGRA) 100 MG tablet Take 100 mg by mouth as needed for erectile dysfunction.   Upadacitinib ER (RINVOQ) 15 MG TB24 Take 15 mg by mouth daily.   zolpidem (AMBIEN) 10 MG tablet Take 10 mg by mouth at bedtime.     Review of Systems      All other systems reviewed and are otherwise negative except as noted above.  Physical Exam    VS:  BP (!) 150/90   Pulse 93   Ht 5\' 9"  (1.753 m)   Wt (!) 312 lb 3.2 oz (141.6 kg)   SpO2 98%   BMI 46.10 kg/m  , BMI Body mass index is 46.1 kg/m.  Wt Readings from Last 3 Encounters:  06/27/22 (!) 312 lb 3.2 oz (141.6 kg)  06/17/22 (!) 315 lb 14.4 oz (143.3 kg)  03/15/22 (!) 319 lb 0.1 oz (144.7 kg)     GEN: Well nourished, well developed, in no acute distress. HEENT: normal. Neck: Supple, no JVD, carotid bruits, or masses. Cardiac: RRR, no murmurs, rubs, or gallops. No clubbing, cyanosis, edema.  Radials/PT 2+ and equal bilaterally.  Respiratory:  Respirations regular and unlabored, clear to auscultation bilaterally. GI: Soft, nontender, nondistended. MS: No deformity or atrophy. Skin: Warm and dry, no rash. Neuro:  Strength and sensation are intact. Psych: Normal affect.  Assessment & Plan    Preop Eval  Mr. Stanislawski perioperative risk of a major cardiac event is 0.9% according to the Revised Cardiac Risk Index (RCRI).  Therefore, he is at low risk for perioperative complications.   His functional capacity is good at 5.62 METs according to the [place] Activity Status Index (DASI). Recommendations: According to ACC/AHA guidelines, no further cardiovascular testing needed.  The patient may proceed to surgery at acceptable risk.    Atrial fibrillation -remains off anticoagulation -he has not had a reoccurrence to his knowledge -CHA2DS2-VASc score of 1  Secondary cardiomyopathy -EF up to 55 to 60% back in 2021 -Asymptomatic at this time and appears euvolemic  Essential hypertension -BP elevated today and on retake  150/90 -Encouraged him to take his blood pressure at home and if remains high we may need to titrate some medications -He states it usually is in the 130s  systolic   Mixed hyperlipidemia  HYPERTENSION CONTROL Vitals:   06/27/22 1443 06/27/22 1718  BP: (!) 152/100 (!) 150/90    The patient's blood pressure is elevated above target today.  In order to address the patient's elevated BP: Blood pressure will be monitored at home to determine if medication changes need to be made.         Disposition: Follow up 3 months with Rozann Lesches, MD or APP.  Signed, Elgie Collard, PA-C 06/27/2022, 5:19 PM [facility]

## 2022-06-27 NOTE — Patient Instructions (Signed)
Medication Instructions:  Your physician recommends that you continue on your current medications as directed. Please refer to the Current Medication list given to you today.  *If you need a refill on your cardiac medications before your next appointment, please call your pharmacy*   Lab Work: None ordered If you have labs (blood work) drawn today and your tests are completely normal, you will receive your results only by: [place] (if you have MyChart) OR A paper copy in the mail If you have any lab test that is abnormal or we need to change your treatment, we will call you to review the results.   Follow-Up: At [facility], you and your health needs are our priority.  As part of our continuing mission to provide you with exceptional heart care, we have created designated Provider Care Teams.  These Care Teams include your primary Cardiologist (physician) and Advanced Practice Providers (APPs -  Physician Assistants and Nurse Practitioners) who all work together to provide you with the care you need, when you need it.   Your next appointment:   3 month(s)  Provider:   Rozann Lesches, MD   Other Instructions Check your blood pressure daily, one hour after taking your morning medications for the next two weeks, keep a log and send [place] the readings through mychart.

## 2022-06-30 ENCOUNTER — Other Ambulatory Visit: Payer: Self-pay | Admitting: Neurological Surgery

## 2022-07-04 ENCOUNTER — Other Ambulatory Visit: Payer: Self-pay | Admitting: Neurological Surgery

## 2022-07-04 ENCOUNTER — Encounter (HOSPITAL_COMMUNITY): Payer: Self-pay | Admitting: Neurological Surgery

## 2022-07-04 NOTE — Progress Notes (Addendum)
PCP - Dr.Albert Danne Baxter, [place]) Cardiologist - Nicholes Rough, PA-C   PPM/ICD - Denies   Chest x-ray - 08/16/2019 EKG - 06/27/22 Stress Test - Denies ECHO - 09/27/2019 Cardiac Cath - Denies   Sleep Study - OSA CPAP - Yes  Diabetes: Denies   Blood Thinner Instructions: N/A Aspirin Instructions: N/A   ERAS Protcol - Yes PRE-SURGERY Ensure or G2- No   COVID TEST- N/A  LD of Rinvoq and Methotrexate: 06/13/22     Anesthesia review: Yes, cardiac hx; Clearance 06/27/22 In Epic   Patient denies shortness of breath, fever, cough and chest pain at PAT appointment     All instructions explained to the patient, with a verbal understanding of the material. Patient agrees to go over the instructions while at home for a better understanding. Patient also instructed to self quarantine after being tested for COVID-19. The opportunity to ask questions was provided.   Surgical Instructions    Your procedure is scheduled on Tuesday, July 05, 2022 at 9:57 AM.   Report to [facility] Main Entrance "A" at 7:30 A.M., then check in with the Admitting office.  Call this number if you have problems the morning of surgery:  [phone]   If you have any questions prior to your surgery date call [phone]: Open Monday-Friday 8am-4pm If you experience any cold or flu symptoms such as cough, fever, chills, shortness of breath, etc. between now and your scheduled surgery, please notify us at the above number     Remember:  Do not eat after midnight the night before your surgery  You may drink clear liquids until 7:00 AM the morning of your surgery.   Clear liquids allowed are: Water, Non-Citrus Juices (without pulp), Carbonated Beverages, Clear Tea, Black Coffee ONLY (NO MILK, CREAM OR POWDERED CREAMER of any kind), and Gatorade    Take these medicines the morning of surgery with A SIP OF WATER:   atorvastatin (LIPITOR)  gabapentin (NEURONTIN)  omeprazole (PRILOSEC)   Fluticasone-Salmeterol (ADVAIR)   IF NEEDED: HYDROcodone-acetaminophen (NORCO)   Please bring all inhalers with you the day of surgery.    Follow your surgeon's instructions on when to stop Upadacitinib ER [facility]).  If no instructions were given by your surgeon then you will need to call the office to get those instructions.     As of today, STOP taking any Aspirin (unless otherwise instructed by your surgeon) Aleve, Naproxen, Ibuprofen, Motrin, Advil, diclofenac (VOLTAREN), Goody's, BC's, all herbal medications, fish oil, and all vitamins.     Do NOT Smoke (Tobacco/Vaping)  24 hours prior to your procedure  If you use a CPAP at night, you may bring your mask for your overnight stay.   Contacts, glasses, hearing aids, dentures or partials may not be worn into surgery, please bring cases for these belongings   For patients admitted to the hospital, discharge time will be determined by your treatment team.   Patients discharged the day of surgery will not be allowed to drive home, and someone needs to stay with them for 24 hours.   SURGICAL WAITING ROOM VISITATION Patients having surgery or a procedure may have no more than 2 support people in the waiting area - these visitors may rotate.   Children under the age of 29 must have an adult with them who is not the patient. If the patient needs to stay at the hospital during part of their recovery, the visitor guidelines for inpatient rooms apply. Pre-op nurse will coordinate an  appropriate time for 1 support person to accompany patient in pre-op.  This support person may not rotate.   Please refer to [email or web address] for the visitor guidelines for Inpatients (after your surgery is over and you are in a regular room).    Special instructions:    Oral Hygiene is also important to reduce your risk of infection.  Remember - BRUSH YOUR TEETH THE MORNING OF SURGERY WITH YOUR REGULAR  TOOTHPASTE   - Preparing For Surgery   Day of Surgery: Take a shower with CHG soap. Do not wear jewelry or makeup Do not wear lotions, powders, perfumes/colognes, or deodorant. Do not shave 48 hours prior to surgery.  Men may shave face and neck. Do not wear nail polish, gel polish, artificial nails, or any other type of covering on natural nails (fingers and toes) If you have artificial nails or gel coating that need to be removed by a nail salon, please have this removed prior to surgery. Artificial nails or gel coating may interfere with anesthesia's ability to adequately monitor your vital signs. Wear Clean/Comfortable clothing the morning of surgery Do not bring valuables to the hospital.  [facility] is not responsible for any belongings or valuables.  Remember to brush your teeth WITH YOUR REGULAR TOOTHPASTE.   If you received a COVID test during your pre-op visit, it is requested that you wear a mask when out in public, stay away from anyone that may not be feeling well, and notify your surgeon if you develop symptoms. If you have been in contact with anyone that has tested positive in the last 10 days, please notify your surgeon.    Please read over the following fact sheets that you were given.

## 2022-07-04 NOTE — Progress Notes (Signed)
Anesthesia APP Update:  Case: [id number] Date/Time: 07/05/22 0942   Procedures:      Open L4-5 Laminectomy/Synovial cyst resection/TLIF/Posterolateral instrumented fusion - 3C/RM 19     Application of O-Arm   Anesthesia type: General   Pre-op diagnosis: Spondylolisthesis, Lumbar region   Location: [facility] OR ROOM 21 / [place] OR   Surgeons: Judith Part, MD       DISCUSSION: Patient is 62 year old scheduled for the above procedure.  Surgery was initially scheduled for 06/23/2022; however delayed until he could be re-evaluated by cardiology given history of PAF with previously mild LV dysfunction in the setting of COVID-19 in March 2021. EF had recovered on April 2021 echo. Cardiology was contacted after he had suspected aflutter at time of triceps repair in August 2022, but follow-up EKG showed SR. He deferred anticoagulation. One year follow-up had been recommended.  Since then he had office visit with Nicholes Rough, PA-C on 06/27/22. She wrote, "Mr. Darr's perioperative risk of a major cardiac event is 0.9% according to the Revised Cardiac Risk Index (RCRI).  Therefore, he is at low risk for perioperative complications.   His functional capacity is good at 5.62 METs according to the Duke Activity Status Index (DASI). Recommendations: According to ACC/AHA guidelines, no further cardiovascular testing needed.  The patient may proceed to surgery at acceptable risk."  History includes never smoker, asthma, OSA (uses CPAP), psoriasis/psoriatic arthritis, HTN, HLD, GERD, RLL CAP PNA/empyema (s/p right VATS/decortication 03/20/14), afib with RVR with secondary cardiomyopathy (08/2019 in setting of COVID-19; 02/01/21 EKG reportedly showed aflutter at 109 bpm at time of triceps repair; patient opted not to take Eliquis due to history of significant nose bleeds, preferred ASA). BMI is consistent with morbid obesity.    [place] admission 03/15/22-03/17/22 for left thigh cellulitis. CT scan with no findings of  fluid collections or other deep-seated infection.  He showed improvement with empiric vancomycin, and was discharged home on oral doxycyline for 10 more days. He also received wound care at the wound clinic in [place].   Anesthesia team to evaluate on the day of surgery. Last labs done were on 06/17/22. He will need an updated T&S.    VS:  BP Readings from Last 3 Encounters:  06/27/22 (!) 150/90  06/17/22 (!) 148/91  03/17/22 (!) 158/74   Pulse Readings from Last 3 Encounters:  06/27/22 93  06/17/22 73  03/17/22 (!) 50     PROVIDERS: Andres Shad, MD is PCP (Angelina Sheriff, [place]) Rozann Lesches, MD is cardiologist    LABS: Most recent labs in [facility] include: Lab Results  Component Value Date   WBC 12.9 (H) 06/17/2022   HGB 14.1 06/17/2022   HCT 42.5 06/17/2022   PLT 301 06/17/2022   GLUCOSE 123 (H) 06/17/2022   ALT 42 03/16/2022   AST 20 03/16/2022   NA 140 06/17/2022   K 3.6 06/17/2022   CL 106 06/17/2022   CREATININE 1.08 06/17/2022   BUN 19 06/17/2022   CO2 26 06/17/2022   HGBA1C 5.7 (H) 08/08/2021    IMAGES: CT Left Femur 03/15/22: IMPRESSION: 1. Subcutaneous edema in the lateral thigh with some accumulation along the superficial fascia margin. There is also cutaneous and subcutaneous edema in the left medial thigh. No drainable collection is identified. 2. No bony destructive findings characteristic of osteomyelitis. 3. Moderate knee effusion. 4. Baker's cyst containing a 9 mm free osteochondral fragment. Also 8 mm chronic free osteochondral fragment in the posterior intercondylar notch. 5. Mild sigmoid colon diverticulosis.  6. Bipartite patella. 7. Osteoarthritis of the knee. 8. Fatty left spermatic cord.   MRI L-spine 12/23/21: Images can be viewed in [facility]/PACS.     EKG:  EKG 06/27/22: NSR. Minimal voltage criteria for LVH, may be normal variant.  EKG 03/15/22: NSR     CV: RLE Venous US 08/08/2021 IMPRESSION: Suboptimal evaluation, within  these constraints; No evidence of femoropopliteal DVT within the RIGHT lower extremity.   Echocardiogram 09/27/2019  1. Left ventricular ejection fraction, by estimation, is 55 to 60%. The  left ventricle has normal function. The left ventricle has no regional  wall motion abnormalities. There is moderate concentric left ventricular  hypertrophy. Left ventricular  diastolic parameters were normal.   2. Right ventricular systolic function is normal. The right ventricular  size is normal.   3. The mitral valve is grossly normal. Trivial mitral valve  regurgitation.   4. The aortic valve is tricuspid. Aortic valve regurgitation is not  visualized. No aortic stenosis is present.   5. The inferior vena cava is normal in size with greater than 50%  respiratory variability, suggesting right atrial pressure of 3 mmHg.  - Comparison echo 08/17/19: LVEF 45-50% with global LV hypokinesis in setting of afib with RVR and COVID-19.     Past Medical History:  Diagnosis Date   Asthma    Atrial fibrillation [facility])    Documented March 2021; pt states this was during Covid +, side effect   Essential hypertension    GERD (gastroesophageal reflux disease)    History of kidney stones    History of nephrolithiasis    Hyperlipemia    Obesity    BMI 42.6   Pneumonia    8 years ago   Psoriasis    Psoriatic arthritis ([place])    Seasonal allergies    Sleep apnea     Past Surgical History:  Procedure Laterality Date   CATARACT EXTRACTION, BILATERAL Bilateral 05/2021   COLONOSCOPY     NOSE SURGERY     TRICEPS TENDON REPAIR Right    VIDEO ASSISTED THORACOSCOPY (VATS)/DECORTICATION Right 03/20/2014   Procedure: VIDEO ASSISTED THORACOSCOPY (VATS)/DECORTICATION;  Surgeon: Ivin Poot, MD;  Location: [facility] OR;  Service: Thoracic;  Laterality: Right;    MEDICATIONS: No current facility-administered medications for this encounter.    ascorbic acid (VITAMIN C) 500 MG tablet   atorvastatin (LIPITOR) 80 MG  tablet   Cholecalciferol (VITAMIN D3) 125 MCG (5000 UT) CAPS   diclofenac (VOLTAREN) 75 MG EC tablet   Fluticasone-Salmeterol (ADVAIR) 250-50 MCG/DOSE AEPB   folic acid (FOLVITE) 1 MG tablet   gabapentin (NEURONTIN) 600 MG tablet   HYDROcodone-acetaminophen (NORCO) 10-325 MG tablet   lisinopril (ZESTRIL) 20 MG tablet   methotrexate (RHEUMATREX) 2.5 MG tablet   montelukast (SINGULAIR) 10 MG tablet   omeprazole (PRILOSEC) 20 MG capsule   sildenafil (VIAGRA) 100 MG tablet   Upadacitinib ER (RINVOQ) 15 MG TB24   zolpidem (AMBIEN) 10 MG tablet    Myra Gianotti, PA-C Surgical Short Stay/Anesthesiology [facility] Phone [phone] [facility] Phone [phone] 07/04/2022 10:46 AM

## 2022-07-04 NOTE — Anesthesia Preprocedure Evaluation (Addendum)
Anesthesia Evaluation  Patient identified by MRN, date of birth, ID band Patient awake    Reviewed: Allergy & Precautions, H&P , NPO status , Patient's Chart, lab work & pertinent test results  Airway Mallampati: III  TM Distance: >3 FB Neck ROM: Full    Dental no notable dental hx. (+) Teeth Intact, Dental Advisory Given   Pulmonary asthma , sleep apnea and Continuous Positive Airway Pressure Ventilation    Pulmonary exam normal breath sounds clear to auscultation       Cardiovascular hypertension, Pt. on medications + dysrhythmias Atrial Fibrillation  Rhythm:Regular Rate:Normal     Neuro/Psych negative neurological ROS  negative psych ROS   GI/Hepatic Neg liver ROS,GERD  Medicated,,  Endo/Other    Morbid obesity  Renal/GU negative Renal ROS  negative genitourinary   Musculoskeletal  (+) Arthritis , Osteoarthritis,    Abdominal   Peds  Hematology negative hematology ROS (+)   Anesthesia Other Findings   Reproductive/Obstetrics negative OB ROS                             Anesthesia Physical Anesthesia Plan  ASA: 3  Anesthesia Plan: General   Post-op Pain Management: Ofirmev IV (intra-op)*   Induction: Intravenous  PONV Risk Score and Plan: 3 and Ondansetron, Dexamethasone and Midazolam  Airway Management Planned: Oral ETT  Additional Equipment:   Intra-op Plan:   Post-operative Plan: Extubation in OR  Informed Consent: I have reviewed the patients History and Physical, chart, labs and discussed the procedure including the risks, benefits and alternatives for the proposed anesthesia with the patient or authorized representative who has indicated his/her understanding and acceptance.     Dental advisory given  Plan Discussed with: CRNA  Anesthesia Plan Comments: (PAT note written 07/04/2022 by Myra Gianotti, PA-C.  )       Anesthesia Quick Evaluation

## 2022-07-05 ENCOUNTER — Encounter (HOSPITAL_COMMUNITY): Payer: Self-pay | Admitting: Neurological Surgery

## 2022-07-05 ENCOUNTER — Other Ambulatory Visit: Payer: Self-pay

## 2022-07-05 ENCOUNTER — Encounter (HOSPITAL_COMMUNITY)
Admission: RE | Disposition: A | Discharge status: Home / Self Care | Payer: Self-pay | Source: Home / Self Care | Attending: Neurological Surgery

## 2022-07-05 ENCOUNTER — Ambulatory Visit (HOSPITAL_COMMUNITY): Payer: BC Managed Care – PPO

## 2022-07-05 ENCOUNTER — Ambulatory Visit (HOSPITAL_COMMUNITY): Payer: BC Managed Care – PPO | Admitting: Vascular Surgery

## 2022-07-05 ENCOUNTER — Observation Stay (HOSPITAL_COMMUNITY)
Admission: RE | Admit: 2022-07-05 | Discharge: 2022-07-06 | Disposition: A | Discharge status: Home / Self Care | Payer: BC Managed Care – PPO | Attending: Neurological Surgery | Admitting: Neurological Surgery

## 2022-07-05 DIAGNOSIS — M7138 Other bursal cyst, other site: Secondary | ICD-10-CM | POA: Diagnosis not present

## 2022-07-05 DIAGNOSIS — M4316 Spondylolisthesis, lumbar region: Secondary | ICD-10-CM | POA: Diagnosis not present

## 2022-07-05 DIAGNOSIS — J45909 Unspecified asthma, uncomplicated: Secondary | ICD-10-CM | POA: Diagnosis not present

## 2022-07-05 DIAGNOSIS — M5416 Radiculopathy, lumbar region: Secondary | ICD-10-CM | POA: Diagnosis not present

## 2022-07-05 DIAGNOSIS — I10 Essential (primary) hypertension: Secondary | ICD-10-CM | POA: Diagnosis not present

## 2022-07-05 DIAGNOSIS — I4891 Unspecified atrial fibrillation: Secondary | ICD-10-CM | POA: Diagnosis not present

## 2022-07-05 HISTORY — PX: TRANSFORAMINAL LUMBAR INTERBODY FUSION (TLIF) WITH PEDICLE SCREW FIXATION 1 LEVEL: SHX6141

## 2022-07-05 SURGERY — TRANSFORAMINAL LUMBAR INTERBODY FUSION (TLIF) WITH PEDICLE SCREW FIXATION 1 LEVEL
Anesthesia: General

## 2022-07-05 MED ORDER — PHENYLEPHRINE 80 MCG/ML (10ML) SYRINGE FOR IV PUSH (FOR BLOOD PRESSURE SUPPORT)
PREFILLED_SYRINGE | INTRAVENOUS | Status: DC | PRN
  Administered 2022-07-05: 80 ug via INTRAVENOUS

## 2022-07-05 MED ORDER — MOMETASONE FURO-FORMOTEROL FUM 200-5 MCG/ACT IN AERO
2.0000 | INHALATION_SPRAY | Freq: Two times a day (BID) | RESPIRATORY_TRACT | Status: DC
  Filled 2022-07-05 (×2): qty 8.8

## 2022-07-05 MED ORDER — DICLOFENAC SODIUM 75 MG PO TBEC: 75.0000 mg | DELAYED_RELEASE_TABLET | Freq: Two times a day (BID) | ORAL | Status: DC | PRN

## 2022-07-05 MED ORDER — KETAMINE HCL 50 MG/5ML IJ SOSY
PREFILLED_SYRINGE | INTRAMUSCULAR | Status: AC
  Filled 2022-07-05: qty 5

## 2022-07-05 MED ORDER — ROCURONIUM BROMIDE 10 MG/ML (PF) SYRINGE
PREFILLED_SYRINGE | INTRAVENOUS | Status: AC
  Filled 2022-07-05: qty 10

## 2022-07-05 MED ORDER — LISINOPRIL 20 MG PO TABS
20.0000 mg | ORAL_TABLET | Freq: Every day | ORAL | Status: DC
  Administered 2022-07-05 – 2022-07-06 (×2): 20 mg via ORAL
  Filled 2022-07-05 (×2): qty 1

## 2022-07-05 MED ORDER — ACETAMINOPHEN 10 MG/ML IV SOLN
INTRAVENOUS | Status: AC
  Filled 2022-07-05: qty 100

## 2022-07-05 MED ORDER — SUGAMMADEX SODIUM 200 MG/2ML IV SOLN
INTRAVENOUS | Status: DC | PRN
  Administered 2022-07-05: 400 mg via INTRAVENOUS

## 2022-07-05 MED ORDER — LACTATED RINGERS IV SOLN: INTRAVENOUS | Status: DC | PRN

## 2022-07-05 MED ORDER — ONDANSETRON HCL 4 MG/2ML IJ SOLN
INTRAMUSCULAR | Status: DC | PRN
  Administered 2022-07-05: 4 mg via INTRAVENOUS

## 2022-07-05 MED ORDER — VANCOMYCIN HCL 1500 MG/300ML IV SOLN
1500.0000 mg | Freq: Once | INTRAVENOUS | Status: AC
  Administered 2022-07-05: 1500 mg via INTRAVENOUS
  Filled 2022-07-05: qty 300

## 2022-07-05 MED ORDER — BUPIVACAINE HCL (PF) 0.5 % IJ SOLN
INTRAMUSCULAR | Status: DC | PRN
  Administered 2022-07-05: 5 mL

## 2022-07-05 MED ORDER — HYDROMORPHONE HCL 1 MG/ML IJ SOLN
0.2500 mg | INTRAMUSCULAR | Status: DC | PRN
  Administered 2022-07-05 (×2): 0.25 mg via INTRAVENOUS
  Administered 2022-07-05: 0.5 mg via INTRAVENOUS

## 2022-07-05 MED ORDER — EPHEDRINE 5 MG/ML INJ
INTRAVENOUS | Status: AC
  Filled 2022-07-05: qty 5

## 2022-07-05 MED ORDER — MENTHOL 3 MG MT LOZG: 1.0000 | LOZENGE | OROMUCOSAL | Status: DC | PRN

## 2022-07-05 MED ORDER — POLYETHYLENE GLYCOL 3350 17 G PO PACK: 17.0000 g | PACK | Freq: Every day | ORAL | Status: DC | PRN

## 2022-07-05 MED ORDER — DEXAMETHASONE SODIUM PHOSPHATE 10 MG/ML IJ SOLN
INTRAMUSCULAR | Status: DC | PRN
  Administered 2022-07-05: 10 mg via INTRAVENOUS

## 2022-07-05 MED ORDER — CHLORHEXIDINE GLUCONATE CLOTH 2 % EX PADS: 6.0000 | MEDICATED_PAD | Freq: Once | CUTANEOUS | Status: DC

## 2022-07-05 MED ORDER — PROPOFOL 10 MG/ML IV BOLUS
INTRAVENOUS | Status: AC
  Filled 2022-07-05: qty 20

## 2022-07-05 MED ORDER — OXYCODONE HCL 5 MG PO TABS: 5.0000 mg | ORAL_TABLET | ORAL | Status: DC | PRN

## 2022-07-05 MED ORDER — BUPIVACAINE HCL (PF) 0.5 % IJ SOLN
INTRAMUSCULAR | Status: AC
  Filled 2022-07-05: qty 30

## 2022-07-05 MED ORDER — LIDOCAINE-EPINEPHRINE 1 %-1:100000 IJ SOLN
INTRAMUSCULAR | Status: DC | PRN
  Administered 2022-07-05: 5 mL

## 2022-07-05 MED ORDER — PANTOPRAZOLE SODIUM 40 MG PO TBEC
40.0000 mg | DELAYED_RELEASE_TABLET | Freq: Every day | ORAL | Status: DC
  Administered 2022-07-06: 40 mg via ORAL
  Filled 2022-07-05: qty 1

## 2022-07-05 MED ORDER — PHENYLEPHRINE 80 MCG/ML (10ML) SYRINGE FOR IV PUSH (FOR BLOOD PRESSURE SUPPORT)
PREFILLED_SYRINGE | INTRAVENOUS | Status: AC
  Filled 2022-07-05: qty 10

## 2022-07-05 MED ORDER — ACETAMINOPHEN 325 MG PO TABS
650.0000 mg | ORAL_TABLET | ORAL | Status: DC | PRN
  Administered 2022-07-05 – 2022-07-06 (×3): 650 mg via ORAL
  Filled 2022-07-05 (×3): qty 2

## 2022-07-05 MED ORDER — ZOLPIDEM TARTRATE 5 MG PO TABS
10.0000 mg | ORAL_TABLET | Freq: Every day | ORAL | Status: DC
  Administered 2022-07-05: 10 mg via ORAL
  Filled 2022-07-05: qty 2

## 2022-07-05 MED ORDER — MIDAZOLAM HCL 2 MG/2ML IJ SOLN
INTRAMUSCULAR | Status: AC
  Filled 2022-07-05: qty 2

## 2022-07-05 MED ORDER — MIDAZOLAM HCL 2 MG/2ML IJ SOLN
INTRAMUSCULAR | Status: DC | PRN
  Administered 2022-07-05: 2 mg via INTRAVENOUS

## 2022-07-05 MED ORDER — LIDOCAINE-EPINEPHRINE 1 %-1:100000 IJ SOLN
INTRAMUSCULAR | Status: AC
  Filled 2022-07-05: qty 1

## 2022-07-05 MED ORDER — PROPOFOL 500 MG/50ML IV EMUL
INTRAVENOUS | Status: DC | PRN
  Administered 2022-07-05: 25 ug/kg/min via INTRAVENOUS

## 2022-07-05 MED ORDER — HYDRALAZINE HCL 20 MG/ML IJ SOLN
5.0000 mg | Freq: Once | INTRAMUSCULAR | Status: AC
  Administered 2022-07-05: 5 mg via INTRAVENOUS

## 2022-07-05 MED ORDER — ACETAMINOPHEN 650 MG RE SUPP: 650.0000 mg | RECTAL | Status: DC | PRN

## 2022-07-05 MED ORDER — ACETAMINOPHEN 10 MG/ML IV SOLN
INTRAVENOUS | Status: DC | PRN
  Administered 2022-07-05: 1000 mg via INTRAVENOUS

## 2022-07-05 MED ORDER — PHENYLEPHRINE HCL-NACL 20-0.9 MG/250ML-% IV SOLN
INTRAVENOUS | Status: DC | PRN
  Administered 2022-07-05: 10 ug/min via INTRAVENOUS

## 2022-07-05 MED ORDER — DEXAMETHASONE SODIUM PHOSPHATE 10 MG/ML IJ SOLN
INTRAMUSCULAR | Status: AC
  Filled 2022-07-05: qty 1

## 2022-07-05 MED ORDER — FOLIC ACID 1 MG PO TABS
1.0000 mg | ORAL_TABLET | Freq: Every day | ORAL | Status: DC
  Administered 2022-07-05 – 2022-07-06 (×2): 1 mg via ORAL
  Filled 2022-07-05 (×2): qty 1

## 2022-07-05 MED ORDER — FENTANYL CITRATE (PF) 250 MCG/5ML IJ SOLN
INTRAMUSCULAR | Status: AC
  Filled 2022-07-05: qty 5

## 2022-07-05 MED ORDER — HYDRALAZINE HCL 20 MG/ML IJ SOLN
INTRAMUSCULAR | Status: AC
  Filled 2022-07-05: qty 1

## 2022-07-05 MED ORDER — PHENOL 1.4 % MT LIQD: 1.0000 | OROMUCOSAL | Status: DC | PRN

## 2022-07-05 MED ORDER — DOCUSATE SODIUM 100 MG PO CAPS
100.0000 mg | ORAL_CAPSULE | Freq: Two times a day (BID) | ORAL | Status: DC
  Administered 2022-07-05 – 2022-07-06 (×2): 100 mg via ORAL
  Filled 2022-07-05 (×2): qty 1

## 2022-07-05 MED ORDER — CHLORHEXIDINE GLUCONATE 0.12 % MT SOLN
15.0000 mL | Freq: Once | OROMUCOSAL | Status: AC
  Administered 2022-07-05: 15 mL via OROMUCOSAL
  Filled 2022-07-05: qty 15

## 2022-07-05 MED ORDER — ONDANSETRON HCL 4 MG/2ML IJ SOLN
INTRAMUSCULAR | Status: AC
  Filled 2022-07-05: qty 2

## 2022-07-05 MED ORDER — ATORVASTATIN CALCIUM 40 MG PO TABS
40.0000 mg | ORAL_TABLET | Freq: Every day | ORAL | Status: DC
  Administered 2022-07-06: 40 mg via ORAL
  Filled 2022-07-05: qty 1

## 2022-07-05 MED ORDER — ROCURONIUM BROMIDE 10 MG/ML (PF) SYRINGE
PREFILLED_SYRINGE | INTRAVENOUS | Status: DC | PRN
  Administered 2022-07-05: 60 mg via INTRAVENOUS
  Administered 2022-07-05: 40 mg via INTRAVENOUS
  Administered 2022-07-05: 50 mg via INTRAVENOUS

## 2022-07-05 MED ORDER — ONDANSETRON HCL 4 MG/2ML IJ SOLN: 4.0000 mg | Freq: Four times a day (QID) | INTRAMUSCULAR | Status: DC | PRN

## 2022-07-05 MED ORDER — KETAMINE HCL 10 MG/ML IJ SOLN
INTRAMUSCULAR | Status: DC | PRN
  Administered 2022-07-05: 10 mg via INTRAVENOUS
  Administered 2022-07-05: 30 mg via INTRAVENOUS

## 2022-07-05 MED ORDER — KETOROLAC TROMETHAMINE 30 MG/ML IJ SOLN
30.0000 mg | Freq: Once | INTRAMUSCULAR | Status: AC | PRN
  Administered 2022-07-05: 30 mg via INTRAVENOUS

## 2022-07-05 MED ORDER — THROMBIN 5000 UNITS EX SOLR
CUTANEOUS | Status: AC
  Filled 2022-07-05: qty 5000

## 2022-07-05 MED ORDER — KETOROLAC TROMETHAMINE 30 MG/ML IJ SOLN
INTRAMUSCULAR | Status: AC
  Filled 2022-07-05: qty 1

## 2022-07-05 MED ORDER — HYDROMORPHONE HCL 1 MG/ML IJ SOLN
0.2500 mg | INTRAMUSCULAR | Status: DC | PRN
  Administered 2022-07-05 (×4): 0.5 mg via INTRAVENOUS

## 2022-07-05 MED ORDER — SODIUM CHLORIDE 0.9% FLUSH: 3.0000 mL | INTRAVENOUS | Status: DC | PRN

## 2022-07-05 MED ORDER — HYDROMORPHONE HCL 1 MG/ML IJ SOLN: 1.0000 mg | INTRAMUSCULAR | Status: DC | PRN

## 2022-07-05 MED ORDER — HYDROMORPHONE HCL 1 MG/ML IJ SOLN
INTRAMUSCULAR | Status: AC
  Filled 2022-07-05: qty 1

## 2022-07-05 MED ORDER — GABAPENTIN 600 MG PO TABS
600.0000 mg | ORAL_TABLET | Freq: Two times a day (BID) | ORAL | Status: DC
  Administered 2022-07-05 – 2022-07-06 (×2): 600 mg via ORAL
  Filled 2022-07-05 (×4): qty 1

## 2022-07-05 MED ORDER — LIDOCAINE 2% (20 MG/ML) 5 ML SYRINGE
INTRAMUSCULAR | Status: DC | PRN
  Administered 2022-07-05: 80 mg via INTRAVENOUS

## 2022-07-05 MED ORDER — CYCLOBENZAPRINE HCL 10 MG PO TABS
10.0000 mg | ORAL_TABLET | Freq: Three times a day (TID) | ORAL | Status: DC | PRN
  Administered 2022-07-06: 10 mg via ORAL
  Filled 2022-07-05: qty 1

## 2022-07-05 MED ORDER — SODIUM CHLORIDE 0.9 % IV SOLN
250.0000 mL | INTRAVENOUS | Status: DC
  Administered 2022-07-05: 250 mL via INTRAVENOUS

## 2022-07-05 MED ORDER — THROMBIN 5000 UNITS EX SOLR
OROMUCOSAL | Status: DC | PRN
  Administered 2022-07-05: 5 mL via TOPICAL

## 2022-07-05 MED ORDER — PROPOFOL 1000 MG/100ML IV EMUL
INTRAVENOUS | Status: AC
  Filled 2022-07-05: qty 300

## 2022-07-05 MED ORDER — LIDOCAINE 2% (20 MG/ML) 5 ML SYRINGE
INTRAMUSCULAR | Status: AC
  Filled 2022-07-05: qty 5

## 2022-07-05 MED ORDER — FENTANYL CITRATE (PF) 250 MCG/5ML IJ SOLN
INTRAMUSCULAR | Status: DC | PRN
  Administered 2022-07-05: 50 ug via INTRAVENOUS
  Administered 2022-07-05: 150 ug via INTRAVENOUS
  Administered 2022-07-05: 50 ug via INTRAVENOUS

## 2022-07-05 MED ORDER — SODIUM CHLORIDE 0.9% FLUSH
3.0000 mL | Freq: Two times a day (BID) | INTRAVENOUS | Status: DC
  Administered 2022-07-05: 3 mL via INTRAVENOUS

## 2022-07-05 MED ORDER — SUCCINYLCHOLINE CHLORIDE 200 MG/10ML IV SOSY
PREFILLED_SYRINGE | INTRAVENOUS | Status: AC
  Filled 2022-07-05: qty 10

## 2022-07-05 MED ORDER — PHENYLEPHRINE HCL-NACL 20-0.9 MG/250ML-% IV SOLN
INTRAVENOUS | Status: AC
  Filled 2022-07-05: qty 500

## 2022-07-05 MED ORDER — LACTATED RINGERS IV SOLN: INTRAVENOUS | Status: DC

## 2022-07-05 MED ORDER — HYDROMORPHONE HCL 1 MG/ML IJ SOLN
INTRAMUSCULAR | Status: AC
  Filled 2022-07-05: qty 2

## 2022-07-05 MED ORDER — MONTELUKAST SODIUM 10 MG PO TABS
10.0000 mg | ORAL_TABLET | Freq: Every day | ORAL | Status: DC
  Administered 2022-07-05: 10 mg via ORAL
  Filled 2022-07-05: qty 1

## 2022-07-05 MED ORDER — 0.9 % SODIUM CHLORIDE (POUR BTL) OPTIME
TOPICAL | Status: DC | PRN
  Administered 2022-07-05: 1000 mL

## 2022-07-05 MED ORDER — PROPOFOL 10 MG/ML IV BOLUS
INTRAVENOUS | Status: DC | PRN
  Administered 2022-07-05: 175 mg via INTRAVENOUS

## 2022-07-05 MED ORDER — ONDANSETRON HCL 4 MG PO TABS: 4.0000 mg | ORAL_TABLET | Freq: Four times a day (QID) | ORAL | Status: DC | PRN

## 2022-07-05 MED ORDER — CEFAZOLIN SODIUM-DEXTROSE 2-4 GM/100ML-% IV SOLN
2.0000 g | Freq: Three times a day (TID) | INTRAVENOUS | Status: AC
  Administered 2022-07-05 – 2022-07-06 (×2): 2 g via INTRAVENOUS
  Filled 2022-07-05 (×2): qty 100

## 2022-07-05 MED ORDER — OXYCODONE HCL 5 MG PO TABS
10.0000 mg | ORAL_TABLET | ORAL | Status: DC | PRN
  Administered 2022-07-05 – 2022-07-06 (×4): 10 mg via ORAL
  Filled 2022-07-05 (×4): qty 2

## 2022-07-05 MED ORDER — ORAL CARE MOUTH RINSE: 15.0000 mL | Freq: Once | OROMUCOSAL | Status: AC

## 2022-07-05 MED ORDER — CEFAZOLIN IN SODIUM CHLORIDE 3-0.9 GM/100ML-% IV SOLN
3.0000 g | INTRAVENOUS | Status: AC
  Administered 2022-07-05: 3 g via INTRAVENOUS
  Filled 2022-07-05: qty 100

## 2022-07-05 SURGICAL SUPPLY — 77 items
BAG COUNTER SPONGE SURGICOUNT (BAG) ×1 IMPLANT
BAND RUBBER #18 3X1/16 STRL (MISCELLANEOUS) ×2 IMPLANT
BASKET BONE COLLECTION (BASKET) ×1 IMPLANT
BENZOIN TINCTURE PRP APPL 2/3 (GAUZE/BANDAGES/DRESSINGS) IMPLANT
BLADE CLIPPER SURG (BLADE) IMPLANT
BLADE SURG 11 STRL SS (BLADE) ×1 IMPLANT
BUR 14 MATCH 3 (BUR) IMPLANT
BUR MATCHSTICK NEURO 3.0 LAGG (BURR) ×1 IMPLANT
BUR MR8 14CM BALL SYMTRI 5 (BUR) IMPLANT
BUR PRECISION FLUTE 5.0 (BURR) ×1 IMPLANT
BURR 14 MATCH 3 (BUR) ×1
BURR MR8 14CM BALL SYMTRI 5 (BUR)
CANISTER SUCT 3000ML PPV (MISCELLANEOUS) ×1 IMPLANT
CNTNR URN SCR LID CUP LEK RST (MISCELLANEOUS) ×1 IMPLANT
CONT SPEC 4OZ STRL OR WHT (MISCELLANEOUS) ×1
COVER BACK TABLE 60X90IN (DRAPES) ×1 IMPLANT
COVERAGE SUPPORT O-ARM STEALTH (MISCELLANEOUS) ×1 IMPLANT
DERMABOND ADVANCED .7 DNX12 (GAUZE/BANDAGES/DRESSINGS) ×1 IMPLANT
DRAPE C-ARM 42X72 X-RAY (DRAPES) IMPLANT
DRAPE C-ARMOR (DRAPES) IMPLANT
DRAPE LAPAROTOMY 100X72X124 (DRAPES) ×1 IMPLANT
DRAPE MICROSCOPE SLANT 54X150 (MISCELLANEOUS) ×1 IMPLANT
DRAPE SHEET LG 3/4 BI-LAMINATE (DRAPES) ×4 IMPLANT
DRAPE SURG 17X23 STRL (DRAPES) ×1 IMPLANT
DURAPREP 26ML APPLICATOR (WOUND CARE) ×1 IMPLANT
ELECT BLADE 4.0 EZ CLEAN MEGAD (MISCELLANEOUS) ×1
ELECT REM PT RETURN 9FT ADLT (ELECTROSURGICAL) ×1
ELECTRODE BLDE 4.0 EZ CLN MEGD (MISCELLANEOUS) IMPLANT
ELECTRODE REM PT RTRN 9FT ADLT (ELECTROSURGICAL) ×1 IMPLANT
FEE COVERAGE SUPPORT O-ARM (MISCELLANEOUS) ×1 IMPLANT
GAUZE 4X4 16PLY ~~LOC~~+RFID DBL (SPONGE) IMPLANT
GAUZE SPONGE 4X4 12PLY STRL (GAUZE/BANDAGES/DRESSINGS) IMPLANT
GLOVE BIOGEL M 6.5 STRL (GLOVE) IMPLANT
GLOVE BIOGEL M 7.0 STRL (GLOVE) IMPLANT
GLOVE BIOGEL M STRL SZ7.5 (GLOVE) IMPLANT
GLOVE BIOGEL PI IND STRL 7.5 (GLOVE) ×2 IMPLANT
GLOVE ECLIPSE 7.5 STRL STRAW (GLOVE) ×2 IMPLANT
GLOVE EXAM NITRILE LRG STRL (GLOVE) IMPLANT
GLOVE EXAM NITRILE XL STR (GLOVE) IMPLANT
GLOVE EXAM NITRILE XS STR PU (GLOVE) IMPLANT
GOWN STRL REUS W/ TWL LRG LVL3 (GOWN DISPOSABLE) ×4 IMPLANT
GOWN STRL REUS W/ TWL XL LVL3 (GOWN DISPOSABLE) IMPLANT
GOWN STRL REUS W/TWL 2XL LVL3 (GOWN DISPOSABLE) IMPLANT
GOWN STRL REUS W/TWL LRG LVL3 (GOWN DISPOSABLE) ×4
GOWN STRL REUS W/TWL XL LVL3 (GOWN DISPOSABLE) ×2
HEMOSTAT POWDER KIT SURGIFOAM (HEMOSTASIS) ×1 IMPLANT
KIT BASIN OR (CUSTOM PROCEDURE TRAY) ×1 IMPLANT
KIT INFUSE X SMALL 1.4CC (Orthopedic Implant) IMPLANT
KIT POSITION SURG JACKSON T1 (MISCELLANEOUS) ×1 IMPLANT
KIT TURNOVER KIT B (KITS) ×1 IMPLANT
MARKER SPHERE PSV REFLC NDI (MISCELLANEOUS) ×5 IMPLANT
MILL BONE PREP (MISCELLANEOUS) IMPLANT
NDL HYPO 18GX1.5 BLUNT FILL (NEEDLE) IMPLANT
NDL SPNL 18GX3.5 QUINCKE PK (NEEDLE) IMPLANT
NEEDLE HYPO 18GX1.5 BLUNT FILL (NEEDLE) IMPLANT
NEEDLE HYPO 22GX1.5 SAFETY (NEEDLE) ×1 IMPLANT
NEEDLE SPNL 18GX3.5 QUINCKE PK (NEEDLE) IMPLANT
NS IRRIG 1000ML POUR BTL (IV SOLUTION) ×1 IMPLANT
PACK LAMINECTOMY NEURO (CUSTOM PROCEDURE TRAY) ×1 IMPLANT
PAD ARMBOARD 7.5X6 YLW CONV (MISCELLANEOUS) ×3 IMPLANT
ROD SPINAL 5.5X35 CP 4 TI (Rod) IMPLANT
SCREW MAS/SET STERILE 4PK (Screw) IMPLANT
SCREW SHANK NL TITAN 6.5X45 (Screw) IMPLANT
SPACER PL CATALYFT LONG 11 (Spacer) IMPLANT
SPIKE FLUID TRANSFER (MISCELLANEOUS) ×1 IMPLANT
SPONGE SURGIFOAM ABS GEL 100 (HEMOSTASIS) IMPLANT
SPONGE T-LAP 4X18 ~~LOC~~+RFID (SPONGE) IMPLANT
STRIP CLOSURE SKIN 1/2X4 (GAUZE/BANDAGES/DRESSINGS) IMPLANT
SUT MNCRL AB 3-0 PS2 18 (SUTURE) ×1 IMPLANT
SUT VIC AB 0 CT1 18XCR BRD8 (SUTURE) ×1 IMPLANT
SUT VIC AB 0 CT1 8-18 (SUTURE) ×2
SUT VIC AB 2-0 CP2 18 (SUTURE) ×1 IMPLANT
SYR 3ML LL SCALE MARK (SYRINGE) IMPLANT
TOWEL GREEN STERILE (TOWEL DISPOSABLE) ×1 IMPLANT
TOWEL GREEN STERILE FF (TOWEL DISPOSABLE) ×1 IMPLANT
TRAY FOLEY MTR SLVR 16FR STAT (SET/KITS/TRAYS/PACK) ×1 IMPLANT
WATER STERILE IRR 1000ML POUR (IV SOLUTION) ×1 IMPLANT

## 2022-07-05 NOTE — Anesthesia Procedure Notes (Signed)
Procedure Name: Intubation Date/Time: 07/05/2022 10:40 AM  Performed by: Minerva Ends, CRNAPre-anesthesia Checklist: Patient identified, Emergency Drugs available, Suction available and Patient being monitored Patient Re-evaluated:Patient Re-evaluated prior to induction Oxygen Delivery Method: Circle system utilized Preoxygenation: Pre-oxygenation with 100% oxygen Induction Type: IV induction Ventilation: Mask ventilation without difficulty Laryngoscope Size: Mac and 4 Grade View: Grade II Tube type: Oral Number of attempts: 1 Airway Equipment and Method: Stylet and Oral airway Placement Confirmation: ETT inserted through vocal cords under direct vision, positive ETCO2 and breath sounds checked- equal and bilateral Tube secured with: Tape Dental Injury: Teeth and Oropharynx as per pre-operative assessment  Comments: Intubation performed by Blossom Hoops under direct supervision of CRNA.

## 2022-07-05 NOTE — Op Note (Signed)
PATIENT: [name] [name]  DAY OF SURGERY: 07/05/22   PRE-OPERATIVE DIAGNOSIS:  Lumbar spondylolisthesis, lumbar synovial cyst, lumbar radiculopathy   POST-OPERATIVE DIAGNOSIS:  Same   PROCEDURE:  L4-5 open laminectomy, transforaminal lumbar interbody fusion, and posterolateral instrumented fusion; use of intraoperative CT and frameless stereotactic navigation   SURGEON:  Surgeon(s) and Role:    Judith Part, MD - Primary    Norm Parcel PA - Assisting   ANESTHESIA: ETGA   BRIEF HISTORY: This is a 62 year old man who presented with low back and RLE pain. The patient was found to have a mobile spondylolisthesis at L4-5 as well as a synovial cyst. I therefore recommended decompression and instrumented fusion at that level. This was discussed with the patient as well as risks, benefits, and alternatives and wished to proceed with surgery.   OPERATIVE DETAIL: The patient was taken to the operating room and anesthesia was induced by the anesthesia team. They were placed on the OR table in the prone position with padding of all pressure points. A formal time out was performed with two patient identifiers and confirmed the operative site. The operative site was marked, hair was clipped with surgical clippers, the area was then prepped and draped in a sterile fashion. Fluoro was used to localize the operative level and a midline incision was placed to expose from L4 to L5. Subperiosteal dissection was performed bilaterally and fluoroscopy was again used to confirm the surgical level.   A spinous process clamp was applied and secured, followed by attachment of a reference array. The field was draped and the O-arm was brought into the field. An intra-op CT was performed, sent to the Stealth navigation station, registered to the patient's anatomy, and confirmed with landmarks with acceptable fit. Stereotactic spinal navigation was utilized throughout the procedure for planning and placement of  pedicle screw trajectories.  Instrumentation was then performed. Stereotaxy was used to guide placement of bilateral pedicle screws (Medtronic) at L4 and L5. These were placed with navigated instruments by localizing the pedicle, drilling a pilot hole, cannulating the pedicle with an awl-tap, palpating for pedicle wall breaches, and then placing the screw.   Decompression was performed, which consisted of a decompressive laminectomy at L4 including resection of a synovial cyst, decompression was more than what was needed for instrumentation alone. A complete facetectomy was then performed on the left at L4-5. The traversing root was identified and protected for the entirety of disc work. A discectomy was performed, the endplates were prepped, and an expandable titanium interbody device (Medtronic) was placed and expanded. Navigated instruments were used for the entirety of disc work.   The pedicle screws were connected with rods bilaterally and final tightened according to manufacturer torque specifications. The bone was thoroughly decorticated over the remaining contralateral facet and transverse processes and the previously resected bone fragments were morselized and used as autograft to complete a posterolateral instrumented fusion at L4-5.   All instrument and sponge counts were correct, the incision was then closed in layers. The patient was then returned to anesthesia for emergence. No apparent complications at the completion of the procedure.   EBL:  169mL   DRAINS: none   SPECIMENS: none   Judith Part, MD 07/05/22 1:16 PM

## 2022-07-05 NOTE — H&P (Signed)
Surgical H&P Update  HPI: 62 y.o. with a history of low back and leg pain. Workup showed a spondylolisthesis with synovial cyst and stenosis. No changes in health since they were last seen. Still having the above and wishes to proceed with surgery.  PMHx:  Past Medical History:  Diagnosis Date   Asthma    Atrial fibrillation [facility])    Documented March 2021; pt states this was during Covid +, side effect   Essential hypertension    GERD (gastroesophageal reflux disease)    History of kidney stones    History of nephrolithiasis    Hyperlipemia    Obesity    BMI 42.6   Pneumonia    8 years ago   Psoriasis    Psoriatic arthritis ([place])    Seasonal allergies    Sleep apnea    FamHx:  Family History  Problem Relation Age of Onset   Atrial fibrillation Mother    SocHx:  reports that he has never smoked. He has never used smokeless tobacco. He reports that he does not currently use drugs. He reports that he does not drink alcohol.  Physical Exam: Strength 5/5 x4 except left PF weakness 4 to 4+/5 and SILTx4  Assesment/Plan: 62 y.o. man with L4-5 stenosis, spondylolisthesis, synovial cyst, here for decompression / resection / TLIF/PLF. Risks, benefits, and alternatives discussed and the patient would like to continue with surgery.  -OR today -3C post-op  Judith Part, MD 07/05/22 9:48 AM

## 2022-07-05 NOTE — Anesthesia Postprocedure Evaluation (Signed)
Anesthesia Post Note  Patient: [name] [name]  Procedure(s) Performed: Open Lumbar four-five Laminectomy/Synovial cyst resection/Transforaminal Lumbar Interbody Fusion /Posterolateral instrumented fusion Application of O-Arm     Patient location during evaluation: PACU Anesthesia Type: General Level of consciousness: awake and alert Pain management: pain level controlled Vital Signs Assessment: post-procedure vital signs reviewed and stable Respiratory status: spontaneous breathing, nonlabored ventilation and respiratory function stable Cardiovascular status: blood pressure returned to baseline and stable Postop Assessment: no apparent nausea or vomiting Anesthetic complications: no  No notable events documented.  Last Vitals:  Vitals:   07/05/22 1445 07/05/22 1500  BP: (!) 166/92   Pulse: 62 64  Resp: 15 10  Temp:    SpO2: 97% 99%    Last Pain:  Vitals:   07/05/22 1500  TempSrc:   PainSc: 4                  Terrea Bruster,W. EDMOND

## 2022-07-05 NOTE — Transfer of Care (Signed)
Immediate Anesthesia Transfer of Care Note  Patient: [name] [name]  Procedure(s) Performed: Open Lumbar four-five Laminectomy/Synovial cyst resection/Transforaminal Lumbar Interbody Fusion /Posterolateral instrumented fusion Application of O-Arm  Patient Location: PACU  Anesthesia Type:General  Level of Consciousness: drowsy  Airway & Oxygen Therapy: Patient Spontanous Breathing and Patient connected to face mask oxygen  Post-op Assessment: Report given to RN and Post -op Vital signs reviewed and stable  Post vital signs: Reviewed and stable  Last Vitals:  Vitals Value Taken Time  BP 184/105 07/05/22 1332  Temp    Pulse 70 07/05/22 1334  Resp 18 07/05/22 1334  SpO2 99 % 07/05/22 1334  Vitals shown include unvalidated device data.  Last Pain:  Vitals:   07/05/22 0904  TempSrc:   PainSc: 4       Patients Stated Pain Goal: 2 (16/10/96 0454)  Complications: No notable events documented.

## 2022-07-06 ENCOUNTER — Encounter (HOSPITAL_COMMUNITY): Payer: Self-pay | Admitting: Neurological Surgery

## 2022-07-06 DIAGNOSIS — M4316 Spondylolisthesis, lumbar region: Secondary | ICD-10-CM | POA: Diagnosis not present

## 2022-07-06 MED ORDER — CYCLOBENZAPRINE HCL 10 MG PO TABS: 10.0000 mg | ORAL_TABLET | Freq: Three times a day (TID) | ORAL | 0 refills | Status: AC | PRN

## 2022-07-06 NOTE — Progress Notes (Signed)
Neurosurgery Service Progress Note  Subjective: No acute events overnight, leg pain resolved, ambulating well, back pain tolerable   Objective: Vitals:   07/05/22 1951 07/05/22 2256 07/05/22 2320 07/06/22 0348  BP: (!) 177/91 (!) 185/85  (!) 155/92  Pulse: 70 (!) 57 (!) 57 61  Resp: 18 20 20 18   Temp: 98.4 F (36.9 C) 98.3 F (36.8 C)  97.9 F (36.6 C)  TempSrc: Oral Oral  Oral  SpO2: 99% 97% 97% 100%  Weight:      Height:        Physical Exam: Strength 5/5 x4 and SILTx4   Assessment & Plan: 62 y.o. man s/p open decompression / synovial cyst resection / TLIF/PSF, recovering well.  -discharge home today  Judith Part  07/06/22 8:10 AM

## 2022-07-06 NOTE — Discharge Summary (Signed)
Discharge Summary  Date of Admission: 07/05/2022  Date of Discharge: 07/06/22  Attending Physician: Emelda Brothers, MD  Hospital Course: Patient was admitted following an uncomplicated H8-2 decompression / synovial cyst resection / PLF/TLIF. They were recovered in PACU and transferred to [facility]. Their preop symptoms were improved, their hospital course was uncomplicated and the patient was discharged home. They will follow up in clinic with me in clinic in 2 weeks.  Neurologic exam at discharge:  Strength 5/5 x4 and SILTx4   Discharge diagnosis: lumbar spondylolisthesis  Judith Part, MD 07/06/22 8:17 AM

## 2022-07-06 NOTE — Evaluation (Signed)
Physical Therapy Evaluation Patient Details Name: [name] [name] MRN: [id number] DOB: 07-Nov-1960 Today's Date: 07/06/2022  History of Present Illness  63 yo male s/p 1/23 L4-5 Laminectomy synovial cyst resection with TLIF/PLF PMH asthma, Afib, HTN, HLD, Obesity, psoriasis,  Clinical Impression   Patient evaluated by Physical Therapy with no further acute PT needs identified. All education has been completed and the patient has no further questions.  See below for any follow-up Physical Therapy or equipment needs. PT is signing off. Thank you for this referral.        Recommendations for follow up therapy are one component of a multi-disciplinary discharge planning process, led by the attending physician.  Recommendations may be updated based on patient status, additional functional criteria and insurance authorization.  Follow Up Recommendations Outpatient PT (Outpt PT can be addressed adn arranged at MD follow up)      Assistance Recommended at Discharge Intermittent Supervision/Assistance  Patient can return home with the following  A little help with walking and/or transfers;A little help with bathing/dressing/bathroom;Assist for transportation;Help with stairs or ramp for entrance    Equipment Recommendations Rolling walker (2 wheels) (wide)  Recommendations for Other Services       Functional Status Assessment Patient has had a recent decline in their functional status and demonstrates the ability to make significant improvements in function in a reasonable and predictable amount of time.     Precautions / Restrictions Precautions Precautions: Back Precaution Comments: Reviewed back prec Required Braces or Orthoses: Other Brace (pt has an LSO brace at home that per MD does not have to wear but can if he chooses) Restrictions Weight Bearing Restrictions: No      Mobility  Bed Mobility Overal bed mobility: Needs Assistance Bed Mobility: Rolling, Sidelying to Sit Rolling:  Modified independent (Device/Increase time) Sidelying to sit: Supervision       General bed mobility comments: Slow moving and tending to hold breath; good form    Transfers Overall transfer level: Needs assistance Equipment used: None Transfers: Sit to/from Stand Sit to Stand: Supervision           General transfer comment: pt reaching for environmental supports. pt with heavy use of bil UE    Ambulation/Gait Ambulation/Gait assistance: Min guard, Supervision Gait Distance (Feet): 300 Feet (x2) Assistive device: None, Rolling walker (2 wheels) Gait Pattern/deviations: Step-through pattern       General Gait Details: incr lateral translation of center of mass with amb sithout UE support, also with L knee pain; Walked with RW and pt with smotther pregression and more efficient gait  Stairs Stairs: Yes Stairs assistance: Supervision Stair Management: One rail Right, Step to pattern, Backwards Number of Stairs: 1 General stair comments: discussed sequence  Wheelchair Mobility    Modified Rankin (Stroke Patients Only)       Balance Overall balance assessment: Mild deficits observed, not formally tested                                           Pertinent Vitals/Pain Pain Assessment Pain Assessment: 0-10 Pain Score: 8  Faces Pain Scale: Hurts a little bit Pain Location: back Pain Descriptors / Indicators: Sore, Operative site guarding Pain Intervention(s): Monitored during session    Home Living Family/patient expects to be discharged to:: Private residence Living Arrangements: Spouse/significant other Available Help at Discharge: Family;Available 24 hours/day Type of Home: House Home Access:  Stairs to enter   [employer] of Steps: 1 Alternate Level Stairs-Number of Steps: basement- does not go down Home Layout: Two level;Able to live on main level with bedroom/bathroom Home Equipment: Shower seat Additional Comments: has a  dog that wife can care for.    Prior Function Prior Level of Function : Working/employed;Driving;Independent/Modified Independent             Mobility Comments: works at [employer]. 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 Pt aslo has pending knee surgery and sees Dr Alvan Dame for the first time during recovering from back surgery. ADLs Comments: difficulty with LB dressing     Hand Dominance   Dominant Hand: Right    Extremity/Trunk Assessment   Upper Extremity Assessment Upper Extremity Assessment: Defer to OT evaluation    Lower Extremity Assessment Lower Extremity Assessment: LLE deficits/detail LLE Deficits / Details: significant L knee OA    Cervical / Trunk Assessment Cervical / Trunk Assessment: Back Surgery  Communication   Communication: No difficulties  Cognition Arousal/Alertness: Awake/alert Behavior During Therapy: WFL for tasks assessed/performed Overall Cognitive Status: Within Functional Limits for tasks assessed                                          General Comments      Exercises     Assessment/Plan    PT Assessment All further PT needs can be met in the next venue of care  PT Problem List Decreased strength;Decreased range of motion;Decreased activity tolerance;Decreased balance;Decreased mobility;Decreased knowledge of use of DME;Decreased safety awareness;Pain;Decreased knowledge of precautions       PT Treatment Interventions      PT Goals (Current goals can be found in the Care Plan section)  Acute Rehab PT Goals Patient Stated Goal: recover well and get work on L knee PT Goal Formulation: All assessment and education complete, DC therapy    Frequency       Co-evaluation               AM-PAC PT "6 Clicks" Mobility  Outcome Measure Help needed turning from your back to your side while in a flat bed  without using bedrails?: None Help needed moving from lying on your back to sitting on the side of a flat bed without using bedrails?: A Little Help needed moving to and from a bed to a chair (including a wheelchair)?: None Help needed standing up from a chair using your arms (e.g., wheelchair or bedside chair)?: A Little Help needed to walk in hospital room?: None Help needed climbing 3-5 steps with a railing? : None 6 Click Score: 22    End of Session   Activity Tolerance: Patient tolerated treatment well Patient left: in bed;with call bell/phone within reach Nurse Communication: Mobility status PT Visit Diagnosis: Other abnormalities of gait and mobility (R26.89);Pain Pain - Right/Left: Left Pain - part of body: Knee (adn back at op site)    Time: 5397-6734 PT Time Calculation (min) (ACUTE ONLY): 33 min   Charges:   PT Evaluation $PT Eval Low Complexity: 1 Low PT Treatments $Gait Training: 8-22 mins        Roney Marion, PT  Acute Rehabilitation Services Office [phone]   Colletta [place] 07/06/2022, 11:17 AM

## 2022-07-06 NOTE — Plan of Care (Incomplete)
  Problem: Education: Goal: Ability to verbalize activity precautions or restrictions will improve Outcome: Completed/Met Goal: Knowledge of the prescribed therapeutic regimen will improve Outcome: Completed/Met Goal: Understanding of discharge needs will improve Outcome: Completed/Met   Problem: Activity: Goal: Ability to avoid complications of mobility impairment will improve Outcome: Completed/Met Goal: Ability to tolerate increased activity will improve Outcome: Completed/Met Goal: Will remain free from falls Outcome: Completed/Met   Problem: Bowel/Gastric: Goal: Gastrointestinal status for postoperative course will improve Outcome: Completed/Met   Problem: Clinical Measurements: Goal: Ability to maintain clinical measurements within normal limits will improve Outcome: Completed/Met Goal: Postoperative complications will be avoided or minimized Outcome: Completed/Met Goal: Diagnostic test results will improve Outcome: Completed/Met   Problem: Clinical Measurements: Goal: Ability to maintain clinical measurements within normal limits will improve Outcome: Completed/Met Goal: Postoperative complications will be avoided or minimized Outcome: Completed/Met Goal: Diagnostic test results will improve Outcome: Completed/Met   Problem: Pain Management: Goal: Pain level will decrease Outcome: Completed/Met   Problem: Skin Integrity: Goal: Will show signs of wound healing Outcome: Completed/Met   Problem: Health Behavior/Discharge Planning: Goal: Identification of resources available to assist in meeting health care needs will improve Outcome: Completed/Met   Problem: Bladder/Genitourinary: Goal: Urinary functional status for postoperative course will improve Outcome: Completed/Met  Patient alert and oriented, void, ambulate. Surgical site clean and dry.

## 2022-07-06 NOTE — Plan of Care (Signed)

## 2022-07-06 NOTE — Plan of Care (Signed)
  Problem: Education: Goal: Ability to verbalize activity precautions or restrictions will improve Outcome: Completed/Met Goal: Knowledge of the prescribed therapeutic regimen will improve Outcome: Completed/Met Goal: Understanding of discharge needs will improve Outcome: Completed/Met   Problem: Activity: Goal: Ability to avoid complications of mobility impairment will improve Outcome: Completed/Met Goal: Ability to tolerate increased activity will improve Outcome: Completed/Met Goal: Will remain free from falls Outcome: Completed/Met   Problem: Bowel/Gastric: Goal: Gastrointestinal status for postoperative course will improve Outcome: Completed/Met   Problem: Clinical Measurements: Goal: Ability to maintain clinical measurements within normal limits will improve Outcome: Completed/Met Goal: Postoperative complications will be avoided or minimized Outcome: Completed/Met Goal: Diagnostic test results will improve Outcome: Completed/Met   Problem: Pain Management: Goal: Pain level will decrease Outcome: Completed/Met   Problem: Skin Integrity: Goal: Will show signs of wound healing Outcome: Completed/Met   Problem: Health Behavior/Discharge Planning: Goal: Identification of resources available to assist in meeting health care needs will improve Outcome: Completed/Met   Problem: Bladder/Genitourinary: Goal: Urinary functional status for postoperative course will improve Outcome: Completed/Met  Patient alert and oriented, void ambulate. Surgical site clean and dry. D/c instructions explain and given to the patient, all questions answered. Will d/c pt home per order.

## 2022-07-06 NOTE — Progress Notes (Signed)
OT evaluation  Patient evaluated by Occupational Therapy with no further acute OT needs identified. All education has been completed and the patient has no further questions. See below for any follow-up Occupational Therapy or equipment needs. OT to sign off. Thank you for referral.     07/06/22 0600  OT Visit Information  Last OT Received On 07/06/22  Assistance Needed +1  History of Present Illness 62 yo male s/p 1/23 L4-5 Laminectomy synovial cyst resection with TLIF/PLF PMH asthma, Afib, HTN, HLD, Obesity, psoriasis,  Precautions  Precautions Back  Precaution Comments handout provided and reviewed for adls  Required Braces or Orthoses Other Brace (pt has an LSO brace at home that per MD does not have to wear but can if he chooses)  Restrictions  Weight Bearing Restrictions No  Home Living  Family/patient expects to be discharged to: Private residence  Living Arrangements Spouse/significant other  Available Help at Discharge Family;Available 24 hours/day  Type of Home House  Home Access Stairs to enter  Entrance Stairs-Number of Steps 1  Home Layout Two level;Able to live on main level with bedroom/bathroom  Alternate Level Stairs-Number of Steps basement- does not go down  [employer] Handicapped height  Bathroom Accessibility Yes  How Accessible Accessible via walker  [place] seat  Additional Comments has a dog that wife can care for. pt can borrow mother in laws RW if needed.  Prior Function  Prior Level of Function  Working/employed;Driving;Independent/Modified Independent  Mobility Comments works at [employer]. 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 Pt aslo has pending knee surgery and sees Dr Alvan Dame for the first time during recovering from back surgery.  ADLs Comments difficulty with LB dressing  Communication   Communication No difficulties  Pain Assessment  Pain Assessment Faces  Faces Pain Scale 2  Pain Location back  Pain Descriptors / Indicators Sore  Pain Intervention(s) Monitored during session;Repositioned;Premedicated before session  Cognition  Arousal/Alertness Awake/alert  Behavior During Therapy WFL for tasks assessed/performed  Overall Cognitive Status Within Functional Limits for tasks assessed  Upper Extremity Assessment  Upper Extremity Assessment Overall WFL for tasks assessed  Lower Extremity Assessment  Lower Extremity Assessment Defer to PT evaluation  Cervical / Trunk Assessment  Cervical / Trunk Assessment Back Surgery  Vision- History  Baseline Vision/History 0 No visual deficits  Patient Visual Report No change from baseline  ADL  Overall ADL's  Needs assistance/impaired  Eating/Feeding Independent  Eating/Feeding Details (indicate cue type and reason) eating breakfast at the end of session  Grooming Independent  Upper Body Bathing Supervision/ safety  Lower Body Bathing Supervison/ safety  Upper Body Dressing  Supervision/safety  Lower Body Dressing With adaptive equipment  Lower Body Dressing Details (indicate cue type and reason) educated on AE as pt states he has bone on bone knee issue and likely knee surgery. pt can have help from wife. pt reports "thats pretty handy" after return demo.  Toilet Transfer Supervision/safety  Functional mobility during ADLs Supervision/safety   Back handout provided and reviewed adls in detail. Pt educated on: clothing between brace if he wears the old one that he has at home, never sleep in brace, set an alarm at night for medication, avoid sitting for long periods of time, correct bed positioning for sleeping, correct sequence for bed mobility, avoiding lifting more than 5 pounds and never  wash directly over incision. Use of clean linen for each shower. All education is complete and patient indicates understanding.   Bed  Mobility  General bed mobility comments oob on arrival and sitting on edge  Transfers  Overall transfer level Needs assistance  Transfers Sit to/from Stand  Sit to Stand Supervision  General transfer comment pt reaching for environmental supports. pt with heavy use of bil UE  Balance  Overall balance assessment Mild deficits observed, not formally tested  General Comments  General comments (skin integrity, edema, etc.) incision dry and intact.educated on back precautions  OT - End of Session  Activity Tolerance Patient tolerated treatment well  Patient left in chair;with call bell/phone within reach (wife leaving at the end of session to go get breakfast)  Nurse Communication Mobility status;Precautions  OT Assessment  OT Recommendation/Assessment Patient does not need any further OT services  OT Visit Diagnosis Unsteadiness on feet (R26.81);Muscle weakness (generalized) (M62.81)  AM-PAC OT "6 Clicks" Daily Activity Outcome Measure (Version 2)  Help from another person eating meals? 4  Help from another person taking care of personal grooming? 4  Help from another person toileting, which includes using toliet, bedpan, or urinal? 4  Help from another person bathing (including washing, rinsing, drying)? 4  Help from another person to put on and taking off regular upper body clothing? 4  Help from another person to put on and taking off regular lower body clothing? 4  6 Click Score 24  Progressive Mobility  What is the highest level of mobility based on the progressive mobility assessment? Level 5 (Walks with assist in room/Solem) - Balance while stepping forward/back and can walk in room with assist - Complete  Mobility Referral Yes  Activity Ambulated with assistance in room;Ambulated with assistance to bathroom  OT Recommendation  Follow Up Recommendations No OT follow up  Assistance recommended at discharge None  Patient can return home with the following Assist for transportation   Functional Status Assessent Patient has had a recent decline in their functional status and demonstrates the ability to make significant improvements in function in a reasonable and predictable amount of time.  OT Equipment None recommended by OT  Acute Rehab OT Goals  Patient Stated Goal to get all medical issues taken care of this year  OT Goal Formulation With patient/family  OT Time Calculation  OT Start Time (ACUTE ONLY) 0718  OT Stop Time (ACUTE ONLY) 0801  OT Time Calculation (min) 43 min  OT General Charges  $OT Visit 1 Visit  OT Evaluation  $OT Eval Moderate Complexity 1 Mod  OT Treatments  $Self Care/Home Management  8-22 mins  Written Expression  Dominant Hand Right   Brynn, OTR/L  Acute Rehabilitation Services Office: [phone] .

## 2022-09-02 ENCOUNTER — Encounter (HOSPITAL_COMMUNITY): Payer: Self-pay

## 2022-09-02 ENCOUNTER — Emergency Department (HOSPITAL_COMMUNITY): Payer: BC Managed Care – PPO

## 2022-09-02 ENCOUNTER — Emergency Department (HOSPITAL_COMMUNITY)
Admission: EM | Admit: 2022-09-02 | Discharge: 2022-09-02 | Disposition: A | Discharge status: Home / Self Care | Payer: BC Managed Care – PPO | Attending: Emergency Medicine | Admitting: Emergency Medicine

## 2022-09-02 ENCOUNTER — Other Ambulatory Visit: Payer: Self-pay

## 2022-09-02 DIAGNOSIS — S8252XA Displaced fracture of medial malleolus of left tibia, initial encounter for closed fracture: Secondary | ICD-10-CM | POA: Diagnosis not present

## 2022-09-02 DIAGNOSIS — S82892A Other fracture of left lower leg, initial encounter for closed fracture: Secondary | ICD-10-CM

## 2022-09-02 DIAGNOSIS — S99912A Unspecified injury of left ankle, initial encounter: Secondary | ICD-10-CM | POA: Diagnosis present

## 2022-09-02 DIAGNOSIS — I10 Essential (primary) hypertension: Secondary | ICD-10-CM | POA: Insufficient documentation

## 2022-09-02 DIAGNOSIS — Z79899 Other long term (current) drug therapy: Secondary | ICD-10-CM | POA: Diagnosis not present

## 2022-09-02 DIAGNOSIS — X58XXXA Exposure to other specified factors, initial encounter: Secondary | ICD-10-CM | POA: Diagnosis not present

## 2022-09-02 DIAGNOSIS — S8262XA Displaced fracture of lateral malleolus of left fibula, initial encounter for closed fracture: Secondary | ICD-10-CM | POA: Insufficient documentation

## 2022-09-02 NOTE — ED Provider Notes (Signed)
[name] [name]   CSN: [id number] Arrival date & time: 09/02/22  1535     History  Chief Complaint  Patient presents with   Ankle Pain    [name] [name] is a 62 y.o. male.  With history of psoriatic polyarthritis, hypertension, obesity, hyperlipidemia, GERD who presents to the ED for evaluation of left ankle pain.  Began approximately 1 week ago and has progressively gotten worse.  Denies falls or injuries.  Presented to his rheumatologist a few days ago and was given a cortisone injection, however states that the pain is still severe.  It is difficult to bear weight due to the pain.  He has noticed some swelling and bruising of the left lateral malleolus as well.  He had been taking Norco for the pain as prescribed by his rheumatologist with minimal relief.  He denies numbness, weakness, tingling, history of gout, fevers, chills.  He has been icing the ankle at home as well.  He denies foot pain or knee pain of the ipsilateral lower extremity.   Ankle Pain      Home Medications Prior to Admission medications   Medication Sig Start Date End Date Taking? Authorizing Provider  ascorbic acid (VITAMIN C) 500 MG tablet Take 500 mg by mouth daily.    [provider]  atorvastatin (LIPITOR) 80 MG tablet Take 40 mg by mouth daily. 04/18/19   [provider]  Cholecalciferol (VITAMIN D3) 125 MCG (5000 UT) CAPS Take 5,000 Units by mouth daily.    [provider]  cyclobenzaprine (FLEXERIL) 10 MG tablet Take 1 tablet (10 mg total) by mouth 3 (three) times daily as needed for muscle spasms. 07/06/22   Judith Part, MD  diclofenac (VOLTAREN) 75 MG EC tablet Take 75 mg by mouth as needed for moderate pain.    [provider]  Fluticasone-Salmeterol (ADVAIR) 250-50 MCG/DOSE AEPB Inhale 1 puff into the lungs 2 (two) times daily.    [provider]  folic acid (FOLVITE) 1 MG tablet Take 1 mg by mouth  daily. 07/25/19   [provider]  gabapentin (NEURONTIN) 600 MG tablet Take 600 mg by mouth 2 (two) times daily. 12/21/21   [provider]  HYDROcodone-acetaminophen (NORCO) 10-325 MG tablet Take 1 tablet by mouth every 4 (four) hours as needed for severe pain. 03/02/22   [provider]  lisinopril (ZESTRIL) 20 MG tablet Take 20 mg by mouth daily. 10/22/20   [provider]  methotrexate (RHEUMATREX) 2.5 MG tablet Take 20 mg by mouth every Monday. 07/25/19   [provider]  montelukast (SINGULAIR) 10 MG tablet Take 10 mg by mouth at bedtime.    [provider]  omeprazole (PRILOSEC) 20 MG capsule Take 20 mg by mouth daily.    [provider]  sildenafil (VIAGRA) 100 MG tablet Take 100 mg by mouth as needed for erectile dysfunction. 12/27/21   [provider]  Upadacitinib ER (RINVOQ) 15 MG TB24 Take 15 mg by mouth daily.    [provider]  zolpidem (AMBIEN) 10 MG tablet Take 10 mg by mouth at bedtime.    [provider]      Allergies    Patient has no known allergies.    Review of Systems   Review of Systems  Musculoskeletal:  Positive for arthralgias and joint swelling.  All other systems reviewed and are negative.   Physical Exam Updated Vital Signs BP (!) 152/98 (BP Location:  Left Arm)   Pulse (!) 48   Temp 98.4 F (36.9 C) (Oral)   Resp 18   Ht 5\' 9"  (1.753 m)   Wt 136.1 kg   SpO2 96%   BMI 44.30 kg/m  Physical Exam Vitals and nursing [name] reviewed.  Constitutional:      General: He is not in acute distress.    Appearance: Normal appearance. He is normal weight. He is not ill-appearing.  HENT:     Head: Normocephalic and atraumatic.  Cardiovascular:     Pulses:          Dorsalis pedis pulses are 2+ on the right side and 2+ on the left side.  Pulmonary:     Effort: Pulmonary effort is normal. No respiratory distress.  Abdominal:     General: Abdomen is flat.  Musculoskeletal:         General: Normal range of motion.     Cervical back: Neck supple.     Comments: Swelling and minimal bruising around the left lateral malleolus.  Full AROM.  Mild TTP.  No warmth or erythema.  Skin:    General: Skin is warm and dry.     Capillary Refill: Capillary refill takes less than 2 seconds.  Neurological:     Mental Status: He is alert and oriented to person, place, and time.  Psychiatric:        Mood and Affect: Mood normal.        Behavior: Behavior normal.     ED Results / Procedures / Treatments   Labs (all labs ordered are listed, but only abnormal results are displayed) Labs Reviewed - No data to display  EKG None  Radiology DG Ankle Complete Left  Result Date: 09/02/2022 CLINICAL DATA:  Pain EXAM: LEFT ANKLE COMPLETE - 3 VIEW COMPARISON:  None Available. FINDINGS: Soft tissue swelling about the ankle. There is a comminuted nondisplaced fracture of the distal fibular metaphysis. Separate oblique fracture of the base of the medial malleolus. There is angulation of the ankle itself apex lateral. Slight joint space loss of the tibiotalar joint. Osteopenia. Vascular calcifications. Well corticated plantar greater than Achilles calcaneal spurs. IMPRESSION: Fractures of the medial malleolus in the distal fibular metaphysis with angulation. Soft tissue swelling. Joint space loss of the tibiotalar joint with some osteophyte formation. Calcaneal spurs. Vascular calcifications. Electronically Signed   By: Jill Side M.D.   On: 09/02/2022 16:50    Procedures Procedures    Medications Ordered in ED Medications - No data to display  ED Course/ Medical Decision Making/ A&P                             Medical Decision Making Amount and/or Complexity of Data Reviewed Radiology: ordered.  This patient presents to the ED for concern of left ankle pain, this involves an extensive number of treatment options, and is a complaint that carries with it a high risk of  complications and morbidity.  The differential diagnosis includes gout, arthritis, fracture, strain, sprain, septic arthritis  Co morbidities that complicate the patient evaluation  psoriatic polyarthritis, hypertension, obesity, hyperlipidemia, GERD  My initial workup includes x-ray left ankle  Additional history obtained from: Nursing notes from this visit. Family wife is present and provides a portion of the history  I ordered imaging studies including x-ray left ankle I independently visualized and interpreted imaging which showed fractures of the medial malleolus and distal fibular metaphysis with angulation  I agree with the radiologist interpretation  Afebrile, hemodynamically stable. 62 year old male presenting to the ED for evaluation of atraumatic left ankle pain for approximately one week. X ray reveals fracture of medial and lateral malleoli. Patient ambulated without difficulty in the ED with CAM walking boot and stated his pain was minimal. He was offered oxycodone, but reports the pain is manageable and will continue hydrocodone at home. He was encouraged to refrain from weight bearing until he can follow up with orthopedics. Fractures are thought to be from decreased bone mineral density. Neurovascular status is intact. He was given contact information for orthopedics and encouraged to follow up as soon as possible. He was given return precautions. Stable at discharge.   At this time there does not appear to be any evidence of an acute emergency medical condition and the patient appears stable for discharge with appropriate outpatient follow up. Diagnosis was discussed with patient who verbalizes understanding of care plan and is agreeable to discharge. I have discussed return precautions with patient and wife who verbalizes understanding. Patient encouraged to follow-up with their PCP within 1 week. All questions answered.  Patient's case discussed with Dr. Gilford Raid who agrees  with plan to discharge with follow-up.   [name]: Portions of this report may have been transcribed using voice recognition software. Every effort was made to ensure accuracy; however, inadvertent computerized transcription errors may still be present.        Final Clinical Impression(s) / ED Diagnoses Final diagnoses:  Closed fracture of left ankle, initial encounter    Rx / DC Orders ED Discharge Orders     None         Nehemiah Massed 09/02/22 Mikel Cella, MD 09/02/22 [phone]

## 2022-09-02 NOTE — ED Notes (Addendum)
Up amb to BR  no distress. No problems  steady gait with walker

## 2022-09-02 NOTE — ED Notes (Signed)
Left ankle sl. Swollen more that right. Bruiseing noted

## 2022-09-02 NOTE — ED Triage Notes (Signed)
Pt states his left ankle started hurting about a week or so ago.  Denies any injury to ankle.  States he can not put any weight on it & it is swollen.  Reports when he tries to stand the pain goes to 10/10 & stabbing.  Otherwise pain 4/10 described as aching.

## 2022-09-02 NOTE — Discharge Instructions (Addendum)
You have been seen today for your complaint of left ankle pain. Your imaging showed an ankle fracture. Your discharge medications include your home medications for pain. Home care instructions are as follows:  Avoid bearing weight on the left ankle until you can follow up with orthopedics. Wear the boot when you need to walk. Follow up with: Dr. Aline Brochure. He is an Doctor, general practice. Call on Monday to schedule an ED follow up visit. Please seek immediate medical care if you develop any of the following symptoms: You have severe pain that lasts. You develop new pain or swelling. Your skin or toenails below the injury turn blue or gray, feel cold, become numb, or are less sensitive to the touch. At this time there does not appear to be the presence of an emergent medical condition, however there is always the potential for conditions to change. Please read and follow the below instructions.  Do not take your medicine if  develop an itchy rash, swelling in your mouth or lips, or difficulty breathing; call 911 and seek immediate emergency medical attention if this occurs.  You may review your lab tests and imaging results in their entirety on your MyChart account.  Please discuss all results of fully with your primary care provider and other specialist at your follow-up visit.  Note: Portions of this text may have been transcribed using voice recognition software. Every effort was made to ensure accuracy; however, inadvertent computerized transcription errors may still be present.

## 2022-09-06 DIAGNOSIS — H40013 Open angle with borderline findings, low risk, bilateral: Secondary | ICD-10-CM | POA: Insufficient documentation

## 2022-09-07 ENCOUNTER — Other Ambulatory Visit (INDEPENDENT_AMBULATORY_CARE_PROVIDER_SITE_OTHER): Payer: BC Managed Care – PPO

## 2022-09-07 ENCOUNTER — Ambulatory Visit: Payer: BC Managed Care – PPO | Admitting: Orthopedic Surgery

## 2022-09-07 VITALS — BP 124/90 | HR 78 | Ht 68.0 in

## 2022-09-07 DIAGNOSIS — S82892A Other fracture of left lower leg, initial encounter for closed fracture: Secondary | ICD-10-CM

## 2022-09-07 NOTE — Progress Notes (Addendum)
Office Visit Note  Addendum: Time 12:30 PM date 09/07/2022  I did discuss this case with the foot and ankle specialist and the recommendation was to place the patient in a cast and hopefully he will NOT drift into any more varus.  There is concern that he has vascular disease as evidenced by the calcification in his dorsalis pedis artery seen on x-ray.  There is also concern that his pain and proprioceptive function may not be normal as he walked on this for 3 weeks.  So we will call him back to get a cast put on.   Patient: [name] [name]           Date of Birth: [birth year]           MRN: [id number] Visit Date: 09/07/2022 Requested by: Arlina Robes, MD [address] [place],  [place] [address] PCP: Arlina Robes, MD  Subjective: Chief Complaint  Patient presents with   Ankle Pain    Left - was achy but really started hurting severely 2 weeks ago I couldn't walk went to Ed was told I had a hairline fracture 07/05/22 had L4 L5 fusion    HPI: [name] [name] is a 62 year old male who comes in with a new ankle fracture.  He is followed in [place] by a rheumatologist and was told he has psoriatic arthritis in his left ankle.  He has a recent spinal fusion by Dr. Johnsie Cancel  He presents with a history of atraumatic onset of pain and swelling in his left ankle which worsened over 3-week.  He therefore went to local hospital here to have it checked out and was found to have a bimalleolar ankle fracture  He is not a diabetic and says he has normal sensation in his lower extremities although this would suggest otherwise  He gave me the number of his rheumatologist [phone] and I called him.  Fortunately they have been taking serial x-rays of his ankle and have a recent x-ray from about 6 months ago which shows that the patient has a degenerative condition of the left ankle joint with tilting of the talus but no obvious fracture  He does have obvious degenerative arthritis in the  joint.              ROS: Recent back surgery seems to be recovering well denies chest pain shortness of breath does report easy bruising and bleeding appears to have some venous stasis disease.  Assessment & Plan: Visit Diagnoses:  1. Closed fracture of left ankle, initial encounter     Plan:   The first images show a bimalleolar fracture of the fibula looks nondisplaced the medial malleolus looks to be a supination abduction type vertical shear fracture with 2 to 3 mm of displacement.  There is extreme arthritis in the joint and extreme narrowing and hypertrophy of the joint as well.  I took new images just to make sure I was not saying something that was positional.  I was also able to obtain pictures of the patient's ankle from approximately 2020 and then about 6 months ago.  And the ankle was already drifting into varus especially the talus but there was no fracture    I changed the patient's cam walker as it did not fit it was too big and put him in a more appropriate 1.  I am also going to run this case by the foot and ankle specialist as he is very close to needing a fusion and the question  is  Does this need ORIF or can we treat this in a cast  Part of the assessment is that he has walked on this for 3 weeks and he really does not have a lot of displacement  He will weight-bear just for transfers and the boot  He has scheduled follow-up for 2 weeks for repeat imaging    Follow-Up Instructions: Return in about 15 days (around 09/22/2022) for FOLLOW UP, FRACTURE CARE, XRAYS, LEFT, ANKLE.   Orders:  Orders Placed This Encounter  Procedures   DG Ankle Complete Left   No orders of the defined types were placed in this encounter.     Procedures: No procedures performed   Clinical Data: No additional findings.  Objective: Vital Signs: BP (!) 124/90   Pulse 78   Ht 5\' 8"  (1.727 m)   BMI 45.61 kg/m   Physical Exam: Physical Exam Vitals and nursing note reviewed.   Constitutional:      Appearance: Normal appearance.     Comments: BMI 45.6  HENT:     Head: Normocephalic and atraumatic.  Eyes:     General: No scleral icterus.       Right eye: No discharge.        Left eye: No discharge.     Extraocular Movements: Extraocular movements intact.     Conjunctiva/sclera: Conjunctivae normal.     Pupils: Pupils are equal, round, and reactive to light.  Cardiovascular:     Rate and Rhythm: Normal rate.     Pulses: Normal pulses.  Skin:    General: Skin is warm and dry.     Capillary Refill: Capillary refill takes less than 2 seconds.  Neurological:     General: No focal deficit present.     Mental Status: He is alert and oriented to person, place, and time.  Psychiatric:        Mood and Affect: Mood normal.        Behavior: Behavior normal.        Thought Content: Thought content normal.        Judgment: Judgment normal.      Ortho Exam:   The foot and ankle appear to be in varus it appears to be chronic but the ankle fracture itself is a supination adduction type of ankle fracture.  He is nontender.  Although he has limited ankle motion is not because of pain is because of the arthritis  The skin is bruised laterally and there is some lateral bruising around the lateral portion of the leg.    Specialty Comments:  No specialty comments available.  Imaging: No results found.   PMFS History: Patient Active Problem List   Diagnosis Date Noted   At low risk for open-angle glaucoma in both eyes 09/06/2022   Spondylolisthesis of lumbar region 07/05/2022   Cellulitis 03/15/2022   Impaired glucose tolerance 08/08/2021   GERD (gastroesophageal reflux disease) 08/07/2021   Hypokalemia 08/07/2021   Psoriatic arthritis (HCC) 08/07/2021   Insomnia 08/07/2021   PAF (paroxysmal atrial fibrillation) (HCC) 08/07/2021   Obesity, Class III, BMI 40-49.9 (morbid obesity) (HCC) 08/07/2021   Hypomagnesemia 08/07/2021   Post-traumatic wound  infection    Cellulitis of right lower leg 08/06/2021   Asthma, chronic 08/06/2021   Coagulopathy (HCC) 08/18/2019   Atrial fibrillation with RVR (HCC) 08/17/2019   COVID-19    Hypoxia    Immune deficiency disorder (HCC)    Empyema (HCC) 03/20/2014   Empyema of right pleural space (HCC) 03/18/2014  CAP (community acquired pneumonia) 03/14/2014   Hydronephrosis, left 03/14/2014   Back pain 03/14/2014   Hypertension    OSA on CPAP    HLD (hyperlipidemia)    Past Medical History:  Diagnosis Date   Asthma    Atrial fibrillation [facility])    Documented March 2021; pt states this was during Covid +, side effect   Essential hypertension    GERD (gastroesophageal reflux disease)    History of kidney stones    History of nephrolithiasis    Hyperlipemia    Obesity    BMI 42.6   Pneumonia    8 years ago   Psoriasis    Psoriatic arthritis (HCC)    Seasonal allergies    Sleep apnea     Family History  Problem Relation Age of Onset   Atrial fibrillation Mother     Past Surgical History:  Procedure Laterality Date   CATARACT EXTRACTION, BILATERAL Bilateral 05/2021   COLONOSCOPY     NOSE SURGERY     TRANSFORAMINAL LUMBAR INTERBODY FUSION (TLIF) WITH PEDICLE SCREW FIXATION 1 LEVEL N/A 07/05/2022   Procedure: Open Lumbar four-five Laminectomy/Synovial cyst resection/Transforaminal Lumbar Interbody Fusion /Posterolateral instrumented fusion;  Surgeon: Judith Part, MD;  Location: [facility];  Service: Neurosurgery;  Laterality: N/A;   TRICEPS TENDON REPAIR Right    VIDEO ASSISTED THORACOSCOPY (VATS)/DECORTICATION Right 03/20/2014   Procedure: VIDEO ASSISTED THORACOSCOPY (VATS)/DECORTICATION;  Surgeon: Ivin Poot, MD;  Location: [facility] OR;  Service: Thoracic;  Laterality: Right;   Social History   Occupational History   Not on file  Tobacco Use   Smoking status: Never   Smokeless tobacco: Never  Vaping Use   Vaping Use: Never used  Substance and Sexual Activity   Alcohol use:  No   Drug use: Not Currently   Sexual activity: Not on file

## 2022-09-16 DIAGNOSIS — S82892A Other fracture of left lower leg, initial encounter for closed fracture: Secondary | ICD-10-CM | POA: Insufficient documentation

## 2022-09-22 ENCOUNTER — Encounter: Payer: Self-pay | Admitting: Orthopedic Surgery

## 2022-09-22 ENCOUNTER — Other Ambulatory Visit (INDEPENDENT_AMBULATORY_CARE_PROVIDER_SITE_OTHER): Payer: BC Managed Care – PPO

## 2022-09-22 ENCOUNTER — Ambulatory Visit (INDEPENDENT_AMBULATORY_CARE_PROVIDER_SITE_OTHER): Payer: BC Managed Care – PPO | Admitting: Orthopedic Surgery

## 2022-09-22 DIAGNOSIS — S82892D Other fracture of left lower leg, subsequent encounter for closed fracture with routine healing: Secondary | ICD-10-CM

## 2022-09-22 NOTE — Progress Notes (Signed)
FOLLOW UP   Estimated date of injury March 13 or 14  So this is a 28 days since onset of pain  X-rays show no change in position of this unusual fracture in a previously deformed left ankle.  The patient is minimal weightbearing in a cam walker.  Color capillary refill and skin look good today.  X-rays as stated  Recommend continue nonweightbearing status except for bathroom trips and see in 4 weeks

## 2022-09-22 NOTE — Patient Instructions (Signed)
We need an out of work note for 6 weeks from today

## 2022-10-05 ENCOUNTER — Ambulatory Visit
Admission: RE | Admit: 2022-10-05 | Discharge: 2022-10-05 | Disposition: A | Discharge status: Home / Self Care | Payer: BC Managed Care – PPO | Source: Ambulatory Visit | Attending: Student | Admitting: Student

## 2022-10-05 ENCOUNTER — Ambulatory Visit: Payer: BC Managed Care – PPO | Admitting: Cardiology

## 2022-10-05 ENCOUNTER — Other Ambulatory Visit: Payer: Self-pay | Admitting: Student

## 2022-10-05 DIAGNOSIS — S82892A Other fracture of left lower leg, initial encounter for closed fracture: Secondary | ICD-10-CM

## 2022-10-20 ENCOUNTER — Encounter: Payer: BC Managed Care – PPO | Admitting: Orthopedic Surgery

## 2022-10-24 ENCOUNTER — Other Ambulatory Visit (HOSPITAL_COMMUNITY): Payer: Self-pay | Admitting: Orthopedic Surgery

## 2022-10-27 NOTE — Progress Notes (Signed)
Pt saw cardiologist 06/27/22. Cardiology visit notes reviewed with Dr. Desmond Lope, anesthesiologist. Molli Knock to proceed with surgery without additional cardiac workup at this time pending any significant changes to pt health.

## 2022-10-28 ENCOUNTER — Other Ambulatory Visit: Payer: Self-pay

## 2022-10-28 ENCOUNTER — Encounter (HOSPITAL_BASED_OUTPATIENT_CLINIC_OR_DEPARTMENT_OTHER): Payer: Self-pay | Admitting: Orthopedic Surgery

## 2022-11-02 NOTE — H&P (Cosign Needed)
[name] [name] is an 62 y.o. male.   Chief Complaint: left ankle pain HPI: Patient is a 62 year old male, with past medical history significant for psoriatic arthritis, presents to the OR today for surgical treatment of his malaligned ankle fracture and advanced ankle arthritis.  Allergies: No Known Allergies  Past Medical History:  Diagnosis Date   Asthma    Atrial fibrillation [facility])    Documented March 2021; pt states this was during Covid +, side effect   Essential hypertension    GERD (gastroesophageal reflux disease)    History of kidney stones    History of nephrolithiasis    Hyperlipemia    Obesity    BMI 42.6   Pneumonia    8 years ago   Psoriasis    Psoriatic arthritis (HCC)    Seasonal allergies    Sleep apnea     Past Surgical History:  Procedure Laterality Date   CATARACT EXTRACTION, BILATERAL Bilateral 05/2021   COLONOSCOPY     NOSE SURGERY     TRANSFORAMINAL LUMBAR INTERBODY FUSION (TLIF) WITH PEDICLE SCREW FIXATION 1 LEVEL N/A 07/05/2022   Procedure: Open Lumbar four-five Laminectomy/Synovial cyst resection/Transforaminal Lumbar Interbody Fusion /Posterolateral instrumented fusion;  Surgeon: Jadene Pierini, MD;  Location: [facility] OR;  Service: Neurosurgery;  Laterality: N/A;   TRICEPS TENDON REPAIR Right    VIDEO ASSISTED THORACOSCOPY (VATS)/DECORTICATION Right 03/20/2014   Procedure: VIDEO ASSISTED THORACOSCOPY (VATS)/DECORTICATION;  Surgeon: Kerin Perna, MD;  Location[facility] OR;  Service: Thoracic;  Laterality: Right;    Family History: Family History  Problem Relation Age of Onset   Atrial fibrillation Mother     Social History:   reports that he has never smoked. He has never used smokeless tobacco. He reports that he does not currently use drugs. He reports that he does not drink alcohol.  Medications: No medications prior to admission.    No results found for this or any previous visit (from the past 48 hour(s)).  No results found.    Height  5\' 9"  (1.753 m), weight 136.1 kg.  PE:  well nourished and well developed.  NAD.  EOMI.  Resp unlabored.  Varus alignment of left ankle joint.  Tender over anterior medial tibiotalar joint and lateral malleolus.  Assessment/Plan Displaced left ankle bimalleolar fracture with end stage arthrosis at the tibiotalar joint.  The patient presents to the OR today for elective left ankle medial and lateral malleolus osteotomies and ankle arthrodesis with large augment due to his painful and unstable ankle injury and underlying arthritis.  The patient specifically understands risks of bleeding, infection, nerve damage, blood clots, need for additional surgery, continued pain, nonunion, post traumatic arthritis, recurrence of deformity, amputation and death.   [name] PA-C [facility] Office:  [phone]   No changes in exam documented above.  To the OR today as scheduled.  The risks and benefits of the alternative treatment options have been discussed in detail.  The patient wishes to proceed with surgery and specifically understands risks of bleeding, infection, nerve damage, blood clots, need for additional surgery, amputation and death.

## 2022-11-02 NOTE — Anesthesia Preprocedure Evaluation (Signed)
Anesthesia Evaluation  Patient identified by MRN, date of birth, ID band Patient awake    Reviewed: Allergy & Precautions, NPO status , Patient's Chart, lab work & pertinent test results  History of Anesthesia Complications Negative for: history of anesthetic complications  Airway Mallampati: II  TM Distance: >3 FB Neck ROM: Full    Dental no notable dental hx.    Pulmonary asthma , sleep apnea    Pulmonary exam normal        Cardiovascular hypertension, Pt. on medications Normal cardiovascular exam+ dysrhythmias Atrial Fibrillation      Neuro/Psych negative neurological ROS     GI/Hepatic Neg liver ROS,GERD  Medicated,,  Endo/Other    Morbid obesity  Renal/GU negative Renal ROS     Musculoskeletal  (+) Arthritis ,    Abdominal   Peds  Hematology negative hematology ROS (+)   Anesthesia Other Findings Day of surgery medications reviewed with patient.  Reproductive/Obstetrics negative OB ROS                             Anesthesia Physical Anesthesia Plan  ASA: 3  Anesthesia Plan: General   Post-op Pain Management: Tylenol PO (pre-op)* and Regional block*   Induction: Intravenous  PONV Risk Score and Plan: Treatment may vary due to age or medical condition, Ondansetron, Dexamethasone and Midazolam  Airway Management Planned: LMA  Additional Equipment: None  Intra-op Plan:   Post-operative Plan: Extubation in OR  Informed Consent: I have reviewed the patients History and Physical, chart, labs and discussed the procedure including the risks, benefits and alternatives for the proposed anesthesia with the patient or authorized representative who has indicated his/her understanding and acceptance.     Dental advisory given  Plan Discussed with: CRNA  Anesthesia Plan Comments:         Anesthesia Quick Evaluation

## 2022-11-03 ENCOUNTER — Encounter (HOSPITAL_BASED_OUTPATIENT_CLINIC_OR_DEPARTMENT_OTHER): Payer: Self-pay | Admitting: Orthopedic Surgery

## 2022-11-03 ENCOUNTER — Ambulatory Visit (HOSPITAL_COMMUNITY): Payer: BC Managed Care – PPO | Admitting: Anesthesiology

## 2022-11-03 ENCOUNTER — Other Ambulatory Visit: Payer: Self-pay

## 2022-11-03 ENCOUNTER — Observation Stay (HOSPITAL_BASED_OUTPATIENT_CLINIC_OR_DEPARTMENT_OTHER)
Admission: RE | Admit: 2022-11-03 | Discharge: 2022-11-09 | Disposition: A | Discharge status: Skilled Nursing Facility | Payer: BC Managed Care – PPO | Attending: Orthopedic Surgery | Admitting: Orthopedic Surgery

## 2022-11-03 ENCOUNTER — Ambulatory Visit (HOSPITAL_BASED_OUTPATIENT_CLINIC_OR_DEPARTMENT_OTHER): Payer: BC Managed Care – PPO

## 2022-11-03 ENCOUNTER — Encounter (HOSPITAL_COMMUNITY)
Admission: RE | Disposition: A | Discharge status: Skilled Nursing Facility | Payer: Self-pay | Source: Home / Self Care | Attending: Orthopedic Surgery

## 2022-11-03 DIAGNOSIS — I10 Essential (primary) hypertension: Secondary | ICD-10-CM | POA: Insufficient documentation

## 2022-11-03 DIAGNOSIS — Z8616 Personal history of COVID-19: Secondary | ICD-10-CM | POA: Insufficient documentation

## 2022-11-03 DIAGNOSIS — M19172 Post-traumatic osteoarthritis, left ankle and foot: Secondary | ICD-10-CM | POA: Insufficient documentation

## 2022-11-03 DIAGNOSIS — Z01818 Encounter for other preprocedural examination: Principal | ICD-10-CM

## 2022-11-03 DIAGNOSIS — I4891 Unspecified atrial fibrillation: Secondary | ICD-10-CM | POA: Insufficient documentation

## 2022-11-03 DIAGNOSIS — X58XXXA Exposure to other specified factors, initial encounter: Secondary | ICD-10-CM | POA: Insufficient documentation

## 2022-11-03 DIAGNOSIS — Z981 Arthrodesis status: Secondary | ICD-10-CM

## 2022-11-03 DIAGNOSIS — Z79899 Other long term (current) drug therapy: Secondary | ICD-10-CM | POA: Insufficient documentation

## 2022-11-03 DIAGNOSIS — S82842P Displaced bimalleolar fracture of left lower leg, subsequent encounter for closed fracture with malunion: Secondary | ICD-10-CM | POA: Diagnosis present

## 2022-11-03 DIAGNOSIS — J45909 Unspecified asthma, uncomplicated: Secondary | ICD-10-CM | POA: Diagnosis not present

## 2022-11-03 HISTORY — PX: TIBIA OSTEOTOMY: SHX1065

## 2022-11-03 HISTORY — PX: ANKLE FUSION: SHX881

## 2022-11-03 SURGERY — ARTHRODESIS ANKLE
Anesthesia: General | Site: Ankle | Laterality: Left

## 2022-11-03 MED ORDER — LISINOPRIL 20 MG PO TABS
20.0000 mg | ORAL_TABLET | Freq: Every day | ORAL | Status: DC
  Administered 2022-11-03 – 2022-11-09 (×7): 20 mg via ORAL
  Filled 2022-11-03 (×7): qty 1

## 2022-11-03 MED ORDER — ONDANSETRON HCL 4 MG/2ML IJ SOLN
INTRAMUSCULAR | Status: DC | PRN
  Administered 2022-11-03: 4 mg via INTRAVENOUS

## 2022-11-03 MED ORDER — ONDANSETRON HCL 4 MG PO TABS: 4.0000 mg | ORAL_TABLET | Freq: Four times a day (QID) | ORAL | Status: DC | PRN

## 2022-11-03 MED ORDER — MIDAZOLAM HCL 2 MG/2ML IJ SOLN
2.0000 mg | Freq: Once | INTRAMUSCULAR | Status: AC
  Administered 2022-11-03: 1 mg via INTRAVENOUS

## 2022-11-03 MED ORDER — DEXAMETHASONE SODIUM PHOSPHATE 10 MG/ML IJ SOLN
INTRAMUSCULAR | Status: DC | PRN
  Administered 2022-11-03: 4 mg via INTRAVENOUS

## 2022-11-03 MED ORDER — OXYCODONE HCL 5 MG PO TABS: 5.0000 mg | ORAL_TABLET | Freq: Once | ORAL | Status: DC | PRN

## 2022-11-03 MED ORDER — PHENYLEPHRINE HCL (PRESSORS) 10 MG/ML IV SOLN
INTRAVENOUS | Status: DC | PRN
  Administered 2022-11-03 (×2): 40 ug via INTRAVENOUS
  Administered 2022-11-03: 80 ug via INTRAVENOUS

## 2022-11-03 MED ORDER — DIPHENHYDRAMINE HCL 12.5 MG/5ML PO ELIX: 12.5000 mg | ORAL_SOLUTION | ORAL | Status: DC | PRN

## 2022-11-03 MED ORDER — PROPOFOL 500 MG/50ML IV EMUL
INTRAVENOUS | Status: DC | PRN
  Administered 2022-11-03: 25 ug/kg/min via INTRAVENOUS

## 2022-11-03 MED ORDER — BUPIVACAINE-EPINEPHRINE (PF) 0.5% -1:200000 IJ SOLN
INTRAMUSCULAR | Status: DC | PRN
  Administered 2022-11-03: 20 mL via PERINEURAL
  Administered 2022-11-03: 10 mL via PERINEURAL

## 2022-11-03 MED ORDER — OXYCODONE HCL 5 MG PO TABS
5.0000 mg | ORAL_TABLET | ORAL | Status: DC | PRN
  Administered 2022-11-03 – 2022-11-09 (×13): 10 mg via ORAL
  Filled 2022-11-03 (×13): qty 2

## 2022-11-03 MED ORDER — ZOLPIDEM TARTRATE 5 MG PO TABS
10.0000 mg | ORAL_TABLET | Freq: Every day | ORAL | Status: DC
  Administered 2022-11-03 – 2022-11-08 (×6): 10 mg via ORAL
  Filled 2022-11-03 (×6): qty 2

## 2022-11-03 MED ORDER — BUPIVACAINE-EPINEPHRINE (PF) 0.5% -1:200000 IJ SOLN
INTRAMUSCULAR | Status: AC
  Filled 2022-11-03: qty 60

## 2022-11-03 MED ORDER — ACETAMINOPHEN 500 MG PO TABS
ORAL_TABLET | ORAL | Status: AC
  Filled 2022-11-03: qty 2

## 2022-11-03 MED ORDER — OXYCODONE HCL 5 MG/5ML PO SOLN: 5.0000 mg | Freq: Once | ORAL | Status: DC | PRN

## 2022-11-03 MED ORDER — FENTANYL CITRATE (PF) 100 MCG/2ML IJ SOLN
100.0000 ug | Freq: Once | INTRAMUSCULAR | Status: AC
  Administered 2022-11-03: 50 ug via INTRAVENOUS

## 2022-11-03 MED ORDER — MOMETASONE FURO-FORMOTEROL FUM 200-5 MCG/ACT IN AERO
2.0000 | INHALATION_SPRAY | Freq: Two times a day (BID) | RESPIRATORY_TRACT | Status: DC
  Administered 2022-11-04 – 2022-11-09 (×8): 2 via RESPIRATORY_TRACT
  Filled 2022-11-03: qty 8.8

## 2022-11-03 MED ORDER — BUPIVACAINE LIPOSOME 1.3 % IJ SUSP
INTRAMUSCULAR | Status: DC | PRN
  Administered 2022-11-03: 10 mL via PERINEURAL

## 2022-11-03 MED ORDER — MIDAZOLAM HCL 2 MG/2ML IJ SOLN
INTRAMUSCULAR | Status: AC
  Filled 2022-11-03: qty 2

## 2022-11-03 MED ORDER — HYDROMORPHONE HCL 1 MG/ML IJ SOLN: 0.5000 mg | INTRAMUSCULAR | Status: DC | PRN

## 2022-11-03 MED ORDER — SODIUM CHLORIDE 0.9 % IV SOLN: INTRAVENOUS | Status: DC

## 2022-11-03 MED ORDER — SENNA 8.6 MG PO TABS
1.0000 | ORAL_TABLET | Freq: Two times a day (BID) | ORAL | Status: DC
  Administered 2022-11-03 – 2022-11-09 (×9): 8.6 mg via ORAL
  Filled 2022-11-03 (×12): qty 1

## 2022-11-03 MED ORDER — VANCOMYCIN HCL 500 MG IV SOLR
INTRAVENOUS | Status: DC | PRN
  Administered 2022-11-03: 500 mg via TOPICAL

## 2022-11-03 MED ORDER — AMISULPRIDE (ANTIEMETIC) 5 MG/2ML IV SOLN: 10.0000 mg | Freq: Once | INTRAVENOUS | Status: DC | PRN

## 2022-11-03 MED ORDER — FENTANYL CITRATE (PF) 100 MCG/2ML IJ SOLN: 25.0000 ug | INTRAMUSCULAR | Status: DC | PRN

## 2022-11-03 MED ORDER — ENOXAPARIN SODIUM 40 MG/0.4ML IJ SOSY
40.0000 mg | PREFILLED_SYRINGE | INTRAMUSCULAR | Status: DC
  Administered 2022-11-04 – 2022-11-09 (×6): 40 mg via SUBCUTANEOUS
  Filled 2022-11-03 (×6): qty 0.4

## 2022-11-03 MED ORDER — FENTANYL CITRATE (PF) 100 MCG/2ML IJ SOLN
INTRAMUSCULAR | Status: AC
  Filled 2022-11-03: qty 2

## 2022-11-03 MED ORDER — LIDOCAINE 2% (20 MG/ML) 5 ML SYRINGE
INTRAMUSCULAR | Status: DC | PRN
  Administered 2022-11-03: 100 mg via INTRAVENOUS

## 2022-11-03 MED ORDER — LACTATED RINGERS IV SOLN: INTRAVENOUS | Status: DC

## 2022-11-03 MED ORDER — CYCLOBENZAPRINE HCL 10 MG PO TABS
10.0000 mg | ORAL_TABLET | Freq: Three times a day (TID) | ORAL | Status: DC | PRN
  Administered 2022-11-07 – 2022-11-09 (×2): 10 mg via ORAL
  Filled 2022-11-03 (×2): qty 1

## 2022-11-03 MED ORDER — CEFAZOLIN IN SODIUM CHLORIDE 3-0.9 GM/100ML-% IV SOLN
INTRAVENOUS | Status: AC
  Filled 2022-11-03: qty 100

## 2022-11-03 MED ORDER — DOCUSATE SODIUM 100 MG PO CAPS
100.0000 mg | ORAL_CAPSULE | Freq: Two times a day (BID) | ORAL | Status: DC
  Administered 2022-11-03 – 2022-11-09 (×8): 100 mg via ORAL
  Filled 2022-11-03 (×10): qty 1

## 2022-11-03 MED ORDER — ACETAMINOPHEN 325 MG PO TABS: 325.0000 mg | ORAL_TABLET | Freq: Four times a day (QID) | ORAL | Status: DC | PRN

## 2022-11-03 MED ORDER — PROPOFOL 10 MG/ML IV BOLUS
INTRAVENOUS | Status: DC | PRN
  Administered 2022-11-03: 200 mg via INTRAVENOUS

## 2022-11-03 MED ORDER — BUPIVACAINE HCL (PF) 0.5 % IJ SOLN
INTRAMUSCULAR | Status: AC
  Filled 2022-11-03: qty 60

## 2022-11-03 MED ORDER — ATORVASTATIN CALCIUM 40 MG PO TABS
40.0000 mg | ORAL_TABLET | Freq: Every day | ORAL | Status: DC
  Administered 2022-11-03 – 2022-11-08 (×6): 40 mg via ORAL
  Filled 2022-11-03 (×6): qty 1

## 2022-11-03 MED ORDER — MONTELUKAST SODIUM 10 MG PO TABS
10.0000 mg | ORAL_TABLET | Freq: Every day | ORAL | Status: DC
  Administered 2022-11-03 – 2022-11-08 (×6): 10 mg via ORAL
  Filled 2022-11-03 (×6): qty 1

## 2022-11-03 MED ORDER — ONDANSETRON HCL 4 MG/2ML IJ SOLN: 4.0000 mg | Freq: Four times a day (QID) | INTRAMUSCULAR | Status: DC | PRN

## 2022-11-03 MED ORDER — 0.9 % SODIUM CHLORIDE (POUR BTL) OPTIME
TOPICAL | Status: DC | PRN
  Administered 2022-11-03: 600 mL

## 2022-11-03 MED ORDER — HYDROMORPHONE HCL 2 MG PO TABS
2.0000 mg | ORAL_TABLET | ORAL | Status: DC | PRN
  Administered 2022-11-05 – 2022-11-09 (×12): 2 mg via ORAL
  Filled 2022-11-03 (×12): qty 1

## 2022-11-03 MED ORDER — VANCOMYCIN HCL 500 MG IV SOLR
INTRAVENOUS | Status: AC
  Filled 2022-11-03: qty 30

## 2022-11-03 MED ORDER — ACETAMINOPHEN 500 MG PO TABS
1000.0000 mg | ORAL_TABLET | Freq: Once | ORAL | Status: AC
  Administered 2022-11-03: 1000 mg via ORAL

## 2022-11-03 MED ORDER — GABAPENTIN 300 MG PO CAPS
600.0000 mg | ORAL_CAPSULE | Freq: Two times a day (BID) | ORAL | Status: DC
  Administered 2022-11-03 – 2022-11-09 (×13): 600 mg via ORAL
  Filled 2022-11-03 (×13): qty 2

## 2022-11-03 MED ORDER — CEFAZOLIN IN SODIUM CHLORIDE 3-0.9 GM/100ML-% IV SOLN
3.0000 g | INTRAVENOUS | Status: AC
  Administered 2022-11-03: 3 g via INTRAVENOUS

## 2022-11-03 MED ORDER — PANTOPRAZOLE SODIUM 40 MG PO TBEC
40.0000 mg | DELAYED_RELEASE_TABLET | Freq: Every day | ORAL | Status: DC
  Administered 2022-11-03 – 2022-11-09 (×7): 40 mg via ORAL
  Filled 2022-11-03 (×7): qty 1

## 2022-11-03 SURGICAL SUPPLY — 101 items
APL PRP STRL LF DISP 70% ISPRP (MISCELLANEOUS) ×1
BANDAGE ESMARK 6X9 LF (GAUZE/BANDAGES/DRESSINGS) IMPLANT
BIT DRILL CANN SA OUTRIGGER NS (DRILL) IMPLANT
BIT DRILL SA 2.7 NS (DRILL) IMPLANT
BIT DRILL SA 3.5 NS (DRILL) IMPLANT
BLADE AVERAGE 25X9 (BLADE) IMPLANT
BLADE BONE MILL MEDIUM (MISCELLANEOUS) IMPLANT
BLADE MICRO SAGITTAL (BLADE) IMPLANT
BLADE SURG 15 STRL LF DISP TIS (BLADE) ×2 IMPLANT
BLADE SURG 15 STRL SS (BLADE) ×3
BNDG CMPR 5X4 KNIT ELC UNQ LF (GAUZE/BANDAGES/DRESSINGS) ×1
BNDG CMPR 5X62 HK CLSR LF (GAUZE/BANDAGES/DRESSINGS) ×1
BNDG CMPR 6 X 5 YARDS HK CLSR (GAUZE/BANDAGES/DRESSINGS) ×1
BNDG CMPR 9X6 STRL LF SNTH (GAUZE/BANDAGES/DRESSINGS)
BNDG ELASTIC 4INX 5YD STR LF (GAUZE/BANDAGES/DRESSINGS) ×1 IMPLANT
BNDG ELASTIC 6INX 5YD STR LF (GAUZE/BANDAGES/DRESSINGS) ×1 IMPLANT
BNDG ESMARK 6X9 LF (GAUZE/BANDAGES/DRESSINGS)
BOOT STEPPER DURA LG (SOFTGOODS) IMPLANT
BOOT STEPPER DURA MED (SOFTGOODS) IMPLANT
BOOT STEPPER DURA XLG (SOFTGOODS) IMPLANT
CHLORAPREP W/TINT 26 (MISCELLANEOUS) ×1 IMPLANT
COVER BACK TABLE 60X90IN (DRAPES) ×1 IMPLANT
COVER MAYO STAND STRL (DRAPES) IMPLANT
CUFF TOURN SGL QUICK 34 (TOURNIQUET CUFF) ×1
CUFF TRNQT CYL 34X4.125X (TOURNIQUET CUFF) ×1 IMPLANT
DRAPE EXTREMITY T 121X128X90 (DISPOSABLE) ×1 IMPLANT
DRAPE IMP U-DRAPE 54X76 (DRAPES) IMPLANT
DRAPE OEC MINIVIEW 54X84 (DRAPES) ×1 IMPLANT
DRAPE U-SHAPE 47X51 STRL (DRAPES) ×1 IMPLANT
DRAPE UTILITY XL STRL (DRAPES) IMPLANT
DRILL CANN SA OUTRIGGER NS (DRILL) ×1
DRILL SA 2.7 NS (DRILL) ×1
DRILL SA 3.5 NS (DRILL) ×1
DRIVER CANN SA T20 (ORTHOPEDIC DISPOSABLE SUPPLIES) IMPLANT
DRSG MEPITEL 4X7.2 (GAUZE/BANDAGES/DRESSINGS) ×1 IMPLANT
ELECT REM PT RETURN 9FT ADLT (ELECTROSURGICAL) ×1
ELECTRODE REM PT RTRN 9FT ADLT (ELECTROSURGICAL) ×1 IMPLANT
GAUZE PAD ABD 8X10 STRL (GAUZE/BANDAGES/DRESSINGS) ×2 IMPLANT
GAUZE SPONGE 4X4 12PLY STRL (GAUZE/BANDAGES/DRESSINGS) ×1 IMPLANT
GLOVE BIO SURGEON STRL SZ7 (GLOVE) IMPLANT
GLOVE BIO SURGEON STRL SZ7.5 (GLOVE) IMPLANT
GLOVE BIO SURGEON STRL SZ8 (GLOVE) ×1 IMPLANT
GLOVE BIOGEL PI IND STRL 7.0 (GLOVE) IMPLANT
GLOVE BIOGEL PI IND STRL 8 (GLOVE) ×2 IMPLANT
GLOVE ECLIPSE 6.5 STRL STRAW (GLOVE) IMPLANT
GLOVE ECLIPSE 8.0 STRL XLNG CF (GLOVE) ×1 IMPLANT
GLOVE SURG SS PI 7.0 STRL IVOR (GLOVE) IMPLANT
GOWN STRL REUS W/ TWL LRG LVL3 (GOWN DISPOSABLE) ×1 IMPLANT
GOWN STRL REUS W/ TWL XL LVL3 (GOWN DISPOSABLE) ×2 IMPLANT
GOWN STRL REUS W/TWL LRG LVL3 (GOWN DISPOSABLE) ×1
GOWN STRL REUS W/TWL XL LVL3 (GOWN DISPOSABLE) ×2
INST ORTHO POSITION ANKLE DISP (INSTRUMENTS) ×1
INSTRUMENT ORTH POSTN ANKL DSP (INSTRUMENTS) IMPLANT
K-WIRE OUTRIGGER LNG SA NS (WIRE) ×1
KIT ASP BONE MRW 60CC (KITS) IMPLANT
KWIRE OUTRIGGER LNG SA NS (WIRE) IMPLANT
MILL BONE PREP (MISCELLANEOUS) IMPLANT
NDL HYPO 22X1.5 SAFETY MO (MISCELLANEOUS) IMPLANT
NEEDLE HYPO 22X1.5 SAFETY MO (MISCELLANEOUS) IMPLANT
NS IRRIG 1000ML POUR BTL (IV SOLUTION) ×1 IMPLANT
PACK BASIN DAY SURGERY FS (CUSTOM PROCEDURE TRAY) ×1 IMPLANT
PAD CAST 4YDX4 CTTN HI CHSV (CAST SUPPLIES) ×2 IMPLANT
PADDING CAST ABS COTTON 4X4 ST (CAST SUPPLIES) IMPLANT
PADDING CAST COTTON 4X4 STRL (CAST SUPPLIES) ×2
PADDING CAST COTTON 6X4 STRL (CAST SUPPLIES) ×1 IMPLANT
PENCIL SMOKE EVACUATOR (MISCELLANEOUS) ×1 IMPLANT
PLATE SM LAT LT (Plate) IMPLANT
SANITIZER HAND PURELL FF 515ML (MISCELLANEOUS) ×1 IMPLANT
SCREW CANN SA ST 6X58 (Screw) IMPLANT
SCREW CANN ST 6.0X60 (Screw) IMPLANT
SCREW LOCK SA NS 3.5X34 (Screw) IMPLANT
SCREW LOCK SA NS 3.5X38 (Screw) IMPLANT
SCREW LOCK SA STRAT 3.5X40 (Screw) IMPLANT
SCREW NLOCK SA 5X34 (Screw) IMPLANT
SCREW NLOCK SA NS 3.5X26 (Screw) IMPLANT
SCREW NLOCK SA NS 3.5X32 (Screw) IMPLANT
SCREW NLOCK SA NS 3.5X36 (Screw) IMPLANT
SCREW NLOCK SA NS 3.5X40 (Screw) IMPLANT
SHEET MEDIUM DRAPE 40X70 STRL (DRAPES) ×1 IMPLANT
SLEEVE SCD COMPRESS KNEE MED (STOCKING) ×1 IMPLANT
SPIKE FLUID TRANSFER (MISCELLANEOUS) IMPLANT
SPLINT PLASTER CAST FAST 5X30 (CAST SUPPLIES) ×20 IMPLANT
SPONGE T-LAP 18X18 ~~LOC~~+RFID (SPONGE) ×1 IMPLANT
STOCKINETTE 6  STRL (DRAPES) ×1
STOCKINETTE 6 STRL (DRAPES) ×1 IMPLANT
STRIP CLOSURE SKIN 1/2X4 (GAUZE/BANDAGES/DRESSINGS) IMPLANT
SUCTION FRAZIER HANDLE 10FR (MISCELLANEOUS) ×1
SUCTION TUBE FRAZIER 10FR DISP (MISCELLANEOUS) ×1 IMPLANT
SUT ETHILON 3 0 PS 1 (SUTURE) ×1 IMPLANT
SUT MNCRL AB 3-0 PS2 18 (SUTURE) ×1 IMPLANT
SUT VIC AB 2-0 SH 27 (SUTURE) ×1
SUT VIC AB 2-0 SH 27XBRD (SUTURE) IMPLANT
SUT VICRYL 0 SH 27 (SUTURE) IMPLANT
SYR 20ML LL LF (SYRINGE) IMPLANT
SYR BULB EAR ULCER 3OZ GRN STR (SYRINGE) ×1 IMPLANT
SYR CONTROL 10ML LL (SYRINGE) IMPLANT
TOWEL GREEN STERILE FF (TOWEL DISPOSABLE) ×2 IMPLANT
TUBE CONNECTING 20X1/4 (TUBING) ×1 IMPLANT
UNDERPAD 30X36 HEAVY ABSORB (UNDERPADS AND DIAPERS) ×1 IMPLANT
WIRE OLIVE SHT SA NS (WIRE) IMPLANT
YANKAUER SUCT BULB TIP NO VENT (SUCTIONS) IMPLANT

## 2022-11-03 NOTE — Anesthesia Procedure Notes (Signed)
Anesthesia Regional Block: Adductor canal block   Pre-Anesthetic Checklist: , timeout performed,  Correct Patient, Correct Site, Correct Laterality,  Correct Procedure, Correct Position, site marked,  Risks and benefits discussed,  Pre-op evaluation,  At surgeon's request and post-op pain management  Laterality: Left  Prep: Maximum Sterile Barrier Precautions used, chloraprep       Needles:  Injection technique: Single-shot  Needle Type: Echogenic Stimulator Needle     Needle Length: 9cm  Needle Gauge: 22     Additional Needles:   Procedures:,,,, ultrasound used (permanent image in chart),,    Narrative:  Start time: 11/03/2022 7:08 AM End time: 11/03/2022 7:11 AM Injection made incrementally with aspirations every 5 mL.  Performed by: Personally  Anesthesiologist: Kaylyn Layer, MD  Additional Notes: Risks, benefits, and alternative discussed. Patient gave consent for procedure. Patient prepped and draped in sterile fashion. Sedation administered, patient remains easily responsive to voice. Relevant anatomy identified with ultrasound guidance. Local anesthetic given in 5cc increments with no signs or symptoms of intravascular injection. No pain or paraesthesias with injection. Patient monitored throughout procedure with signs of LAST or immediate complications. Tolerated well. Ultrasound image placed in chart.  [name] Greenhouse, MD

## 2022-11-03 NOTE — Anesthesia Postprocedure Evaluation (Signed)
Anesthesia Post Note  Patient: [name] [name]  Procedure(s) Performed: Arthrodesis Left Ankle (Left: Ankle) Medial and Lateral Malleolus Osteotomies (Left: Ankle)     Patient location during evaluation: PACU Anesthesia Type: General Level of consciousness: awake and alert Pain management: pain level controlled Vital Signs Assessment: post-procedure vital signs reviewed and stable Respiratory status: spontaneous breathing, nonlabored ventilation and respiratory function stable Cardiovascular status: blood pressure returned to baseline Postop Assessment: no apparent nausea or vomiting Anesthetic complications: no   No notable events documented.  Last Vitals:  Vitals:   11/03/22 1045 11/03/22 1100  BP: (!) 165/88 (!) 144/75  Pulse: (!) 59 (!) 58  Resp: 18 19  Temp:    SpO2: 96% 95%    Last Pain:  Vitals:   11/03/22 1045  PainSc: 0-No pain                 Shanda Howells

## 2022-11-03 NOTE — Op Note (Signed)
11/03/2022  9:57 AM  PATIENT:  [name] [name]  62 y.o. male  PRE-OPERATIVE DIAGNOSIS:   1.  Left ankle post traumatic arthritis      2.  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  POST-OPERATIVE DIAGNOSIS:   Same  Procedure(s):  Left ankle arthrodesis Open treatment of left tibia medial malleolus malunion with internal fixation Large autograft from the left fibula to the tibiltalar joint AP, mortise and lateral xrays of the left ankle AP and lateral xrays of the left foot  SURGEON:  Toni Arthurs, MD  ASSISTANT: [name], PA-C  ANESTHESIA:   General, regional  EBL:  minimal   TOURNIQUET:   Total Tourniquet Time Documented: Thigh (Left) - 106 minutes Total: Thigh (Left) - 106 minutes  COMPLICATIONS:  None apparent  DISPOSITION:  Extubated, awake and stable to recovery.  INDICATION FOR PROCEDURE: 62 year old male with a past medical history significant for psoriatic arthritis injured his left ankle several months ago.  He has had progressive deformity and pain.  When he presented to my office he was found to have severe posttraumatic arthritis and malunited medial and lateral malleolus fractures.  He presents now for operative treatment of these painful conditions.  He has been off of immune suppressing medications for the appropriate amount of time before surgery.  The risks and benefits of the alternative treatment options have been discussed in detail.  The patient wishes to proceed with surgery and specifically understands risks of bleeding, infection, nerve damage, blood clots, need for additional surgery, amputation and death.   PROCEDURE IN DETAIL:  After pre operative consent was obtained, and the correct operative site was identified, the patient was brought to the operating room and placed supine on the OR table.  Anesthesia was administered.  Pre-operative antibiotics were administered.  A surgical timeout was taken.  The left lower extremity was prepped and  draped in standard sterile fashion with a tourniquet around the thigh.  The extremity was elevated, and the tourniquet was inflated to 300 mmHg.  A longitudinal incision was then made along the course of the fibula.  Dissection was carried sharply down through the subcutaneous tissues.  The periosteum was incised and elevated proximally and distally.  The fibula fracture site was identified.  The fibula was cut proximal to the joint line to allow placement of the appropriate lateral plate.  The fibula was then skeletonized and removed.  The lateral malleolus was morselized on the back table to provide bone graft for the arthrodesis site.  The tibiotalar joint was opened and cleaned of all remaining articular cartilage.  There was end-stage arthrosis of both sides of the joint.  The medial malleolar fracture malunion was identified.  It was mobilized from within the joint using curettes and an osteotome.  The wound was irrigated copiously.  The joint was further prepared by perforating all of the subchondral surfaces with a drill bit and then breaking up the subchondral bone with 1/4 inch curved osteotome and a mallet.  The joint was reduced and provisionally pinned.  AP and lateral radiographs confirmed appropriate alignment of the tibiotalar joint.  A guidepin was then inserted from the posterolateral tibia across into the head of the talus.  AP and lateral foot x-rays as well as AP and lateral ankle x-rays confirmed appropriate reduction of the joint and appropriate position of the guidepin.  The guidepin was overdrilled.  A 6.5 mm partially-threaded screw from the [employer] stratum set was inserted over the guidepin.  It was noted to compress the joint appropriately.  The small lateral tibiotalar plate was then placed over the lateral joint line.  It was provisionally pinned proximally and distally.  Radiographs confirmed appropriate position of the plate.  The plate was then secured to the talus with 4  locking and nonlocking screws.  The compression slot was then drilled proximally.  The compression pin was inserted followed by the knot.  The knot was secured compressing the joint appropriately.  The plate was then secured with 3 nonlocking and locking screws.  Radiographs confirmed appropriate position and length of all hardware and appropriate position of the plate.  Attention was then turned to the medial side of the ankle.  The medial malleolus malunion was reduced and provisionally pinned.  A 3.5 mm lag screw was then inserted from the medial malleolus into the distal tibia compressing the nonunion site appropriately.  Final AP, mortise and lateral radiographs of the ankle showed interval arthrodesis of the joint and correction of the medial malleolus nonunion.  The wounds were irrigated copiously and sprinkled with vancomycin powder.  The deep subcutaneous tissues were approximated with inverted simple sutures of 0 Vicryl.  Superficial subcutaneous tissues were closed with inverted 3-0 Monocryl sutures.  The skin incisions were closed with running 3-0 nylon.  Sterile dressings were applied followed by a well-padded short leg splint.  The tourniquet was released after application of the dressings.  The patient was awakened from anesthesia and transported to the recovery room in stable condition.   FOLLOW UP PLAN: The patient will be admitted to the inpatient ward at [facility] due to difficulty maintaining a nonweightbearing status.  This is medically necessary for him to receive physical therapy and Occupational Therapy and evaluation for placement in a skilled nursing facility postoperatively.  Xarelto for DVT prophylaxis starting on postop day 1.   RADIOGRAPHS: AP, mortise and lateral radiographs of the left ankle are obtained intraoperatively.  These show interval arthrodesis and reduction of the ankle joint is well as interval treatment of the medial and lateral malleolus malunions.   Hardware is appropriately positioned and of the appropriate lengths.  AP and lateral radiographs of the left foot are obtained intraoperatively.  These show appropriate position of the lag screw in the head of the talus.  Otherwise normal foot films.    [name] PA-C was present and scrubbed for the duration of the operative case. His assistance was essential in positioning the patient, prepping and draping, gaining and maintaining exposure, performing the operation, closing and dressing the wounds and applying the splint.

## 2022-11-03 NOTE — Anesthesia Procedure Notes (Signed)
Anesthesia Regional Block: Popliteal block   Pre-Anesthetic Checklist: , timeout performed,  Correct Patient, Correct Site, Correct Laterality,  Correct Procedure, Correct Position, site marked,  Risks and benefits discussed,  Pre-op evaluation,  At surgeon's request and post-op pain management  Laterality: Left  Prep: Maximum Sterile Barrier Precautions used, chloraprep       Needles:  Injection technique: Single-shot  Needle Type: Echogenic Stimulator Needle     Needle Length: 9cm  Needle Gauge: 22     Additional Needles:   Procedures:,,,, ultrasound used (permanent image in chart),,    Narrative:  Start time: 11/03/2022 7:11 AM End time: 11/03/2022 7:13 AM Injection made incrementally with aspirations every 5 mL.  Performed by: Personally  Anesthesiologist: Kaylyn Layer, MD  Additional Notes: Risks, benefits, and alternative discussed. Patient gave consent for procedure. Patient prepped and draped in sterile fashion. Sedation administered, patient remains easily responsive to voice. Relevant anatomy identified with ultrasound guidance. Local anesthetic given in 5cc increments with no signs or symptoms of intravascular injection. No pain or paraesthesias with injection. Patient monitored throughout procedure with signs of LAST or immediate complications. Tolerated well. Ultrasound image placed in chart.  [name] Greenhouse, MD

## 2022-11-03 NOTE — Progress Notes (Addendum)
Family would like patient to go to [facility] in [place], [place] for rehab.   Case managers name is Lyndee Leo [phone]; fax [phone]

## 2022-11-03 NOTE — Progress Notes (Signed)
Assisted Dr. Stephannie Peters with left, adductor canal and popliteal, ultrasound guided block. Side rails up, monitors on throughout procedure. See vital signs in flow sheet. Tolerated Procedure well.

## 2022-11-03 NOTE — Transfer of Care (Signed)
Immediate Anesthesia Transfer of Care Note  Patient: [name] [name]  Procedure(s) Performed: Arthrodesis Left Ankle (Left: Ankle) Medial and Lateral Malleolus Osteotomies (Left: Ankle)  Patient Location: PACU  Anesthesia Type:GA combined with regional for post-op pain  Level of Consciousness: drowsy and patient cooperative  Airway & Oxygen Therapy: Patient Spontanous Breathing and Patient connected to face mask oxygen  Post-op Assessment: Report given to RN and Post -op Vital signs reviewed and stable  Post vital signs: Reviewed and stable  Last Vitals:  Vitals Value Taken Time  BP    Temp    Pulse 68 11/03/22 0950  Resp 17 11/03/22 0950  SpO2 96 % 11/03/22 0950  Vitals shown include unvalidated device data.  Last Pain:  Vitals:   11/03/22 0645  PainSc: (S) 0-No pain      Patients Stated Pain Goal: 5 (11/03/22 0645)  Complications: No notable events documented.

## 2022-11-03 NOTE — Discharge Instructions (Signed)
[name] Hewitt, MD [facility]  Please read the following information regarding your care after surgery.  Medications  You only need a prescription for the narcotic pain medicine (ex. oxycodone, Percocet, Norco).  All of the other medicines listed below are available over the counter. ? Aleve 2 pills twice a day for the first 3 days after surgery. ? acetominophen (Tylenol) 650 mg every 4-6 hours as you need for minor to moderate pain ? oxycodone as prescribed for severe pain  Narcotic pain medicine (ex. oxycodone, Percocet, Vicodin) will cause constipation.  To prevent this problem, take the following medicines while you are taking any pain medicine. ? docusate sodium (Colace) 100 mg twice a day ? senna (Senokot) 2 tablets twice a day  ? To help prevent blood clots, take Xarelto as prescribed for two weeks after surgery.  You should also get up every hour while you are awake to move around.    Weight Bearing ? Do not bear any weight on the operated leg or foot.  Cast / Splint / Dressing ? Keep your splint, cast or dressing clean and dry.  Don't put anything (coat hanger, pencil, etc) down inside of it.  If it gets damp, use a hair dryer on the cool setting to dry it.  If it gets soaked, call the office to schedule an appointment for a cast change.    After your dressing, cast or splint is removed; you may shower, but do not soak or scrub the wound.  Allow the water to run over it, and then gently pat it dry.  Swelling It is normal for you to have swelling where you had surgery.  To reduce swelling and pain, keep your toes above your nose for at least 3 days after surgery.  It may be necessary to keep your foot or leg elevated for several weeks.  If it hurts, it should be elevated.  Follow Up Call my office at [phone] when you are discharged from the hospital or surgery center to schedule an appointment to be seen two weeks after surgery.  Call my office at [phone] if you develop  a fever >101.5 F, nausea, vomiting, bleeding from the surgical site or severe pain.     

## 2022-11-03 NOTE — Anesthesia Procedure Notes (Signed)
Procedure Name: LMA Insertion Date/Time: 11/03/2022 7:38 AM  Performed by: Talan Gildner, Jewel Baize, CRNAPre-anesthesia Checklist: Patient identified, Emergency Drugs available, Suction available and Patient being monitored Patient Re-evaluated:Patient Re-evaluated prior to induction Oxygen Delivery Method: Circle system utilized Preoxygenation: Pre-oxygenation with 100% oxygen Induction Type: IV induction Ventilation: Mask ventilation without difficulty LMA: LMA inserted LMA Size: 4.0 Number of attempts: 1 Airway Equipment and Method: Bite block Placement Confirmation: positive ETCO2 Tube secured with: Tape Dental Injury: Teeth and Oropharynx as per pre-operative assessment

## 2022-11-04 ENCOUNTER — Encounter (HOSPITAL_BASED_OUTPATIENT_CLINIC_OR_DEPARTMENT_OTHER): Payer: Self-pay | Admitting: Orthopedic Surgery

## 2022-11-04 DIAGNOSIS — S82842P Displaced bimalleolar fracture of left lower leg, subsequent encounter for closed fracture with malunion: Secondary | ICD-10-CM | POA: Diagnosis not present

## 2022-11-04 MED ORDER — OXYCODONE HCL 5 MG PO TABS: 5.0000 mg | ORAL_TABLET | ORAL | 0 refills | Status: AC | PRN

## 2022-11-04 MED ORDER — SENNA 8.6 MG PO TABS: 1.0000 | ORAL_TABLET | Freq: Every day | ORAL | 0 refills | Status: AC

## 2022-11-04 MED ORDER — DOCUSATE SODIUM 100 MG PO CAPS: 100.0000 mg | ORAL_CAPSULE | Freq: Two times a day (BID) | ORAL | 0 refills | Status: AC

## 2022-11-04 MED ORDER — RIVAROXABAN 10 MG PO TABS: 10.0000 mg | ORAL_TABLET | Freq: Every day | ORAL | 0 refills | Status: AC

## 2022-11-04 NOTE — Evaluation (Signed)
Physical Therapy Evaluation Patient Details Name: [name] [name] MRN: [id number] DOB: 02/18/1961 Today's Date: 11/04/2022  History of Present Illness  Pt is a 62 y/o M admitted on 11/03/22 for scheduled L ankle arthrodesis & L ankle medial & lateral malleolus osteotomies. PMH: asthma, a-fib, essential HTN, GERD, HLD, obesity, PNA, psoriatic arthritis, sleep apnea  Clinical Impression  Pt seen for PT evaluation with pt agreeable. Pt reports prior to admission he was using RW or rollator (as a knee scooter) to minimize weight bearing through LLE. Pt demonstrates good understanding of NWB LLE and good overall safety awareness. Pt is able to complete bed mobility with mod I, STS with min assist & step pivot with min assist with RW. PT provides education re: hand placement & proper technique during STS. Provided pt with HEP handout & pt performed exercises with good technique. Pt reports his wife works during the day & when she's home she cannot provide physical assistance as she has a 20 lb weight lifting restriction herself. Pt very eager to d/c to rehab to maximize independence & reduce fall risk prior to d/c home.   Recommendations for follow up therapy are one component of a multi-disciplinary discharge planning process, led by the attending physician.  Recommendations may be updated based on patient status, additional functional criteria and insurance authorization.  Follow Up Recommendations Can patient physically be transported by private vehicle: Yes     Assistance Recommended at Discharge Intermittent Supervision/Assistance  Patient can return home with the following  A little help with walking and/or transfers;Assistance with cooking/housework;A little help with bathing/dressing/bathroom;Assist for transportation;Help with stairs or ramp for entrance    Equipment Recommendations None recommended by PT (TBD in next venue)  Recommendations for Other Services       Functional Status Assessment  Patient has had a recent decline in their functional status and demonstrates the ability to make significant improvements in function in a reasonable and predictable amount of time.     Precautions / Restrictions Precautions Precautions: Fall Restrictions Weight Bearing Restrictions: Yes LLE Weight Bearing: Non weight bearing      Mobility  Bed Mobility Overal bed mobility: Modified Independent Bed Mobility: Supine to Sit     Supine to sit: Modified independent (Device/Increase time), HOB elevated     General bed mobility comments: use of bed rails PRN    Transfers Overall transfer level: Needs assistance Equipment used: Rolling walker (2 wheels) Transfers: Sit to/from Stand, Bed to chair/wheelchair/BSC Sit to Stand: Min assist   Step pivot transfers: Min assist       General transfer comment: education re: proper hand placement during STS from EOB with RW    Ambulation/Gait                  Stairs            Wheelchair Mobility    Modified Rankin (Stroke Patients Only)       Balance Overall balance assessment: Needs assistance Sitting-balance support: Feet supported Sitting balance-Leahy Scale: Good     Standing balance support: During functional activity, Bilateral upper extremity supported, Reliant on assistive device for balance Standing balance-Leahy Scale: Fair                               Pertinent Vitals/Pain Pain Assessment Pain Assessment: 0-10 Pain Score: 2  Pain Location: L ankle Pain Descriptors / Indicators: Discomfort Pain Intervention(s): Monitored during session  Home Living Family/patient expects to be discharged to:: Private residence Living Arrangements: Spouse/significant other Available Help at Discharge: Family;Available PRN/intermittently (wife works during the day) Type of Home: House Home Access: Stairs to enter Entrance Stairs-Rails: None Secretary/administrator of Steps: 2   Home Layout:  One level Home Equipment: Agricultural consultant (2 wheels);Rollator (4 wheels)      Prior Function Prior Level of Function : Independent/Modified Independent;Working/employed;Driving             Mobility Comments: Pt was working but has been in/out of work for the past 2 years 2/2 multiple injuries/issues. Pt started using RW or rollator (as knee scooter) for the past 12 weeks to minimize weight bearing on LLE. No falls.       Hand Dominance        Extremity/Trunk Assessment   Upper Extremity Assessment Upper Extremity Assessment: Overall WFL for tasks assessed    Lower Extremity Assessment Lower Extremity Assessment: LLE deficits/detail LLE Deficits / Details: not formally assessed but 3/5 knee extension in sitting    Cervical / Trunk Assessment Cervical / Trunk Assessment: Normal  Communication   Communication: No difficulties  Cognition Arousal/Alertness: Awake/alert Behavior During Therapy: WFL for tasks assessed/performed Overall Cognitive Status: Within Functional Limits for tasks assessed                                 General Comments: Good understanding of restrictions & overall safety awareness.        General Comments      Exercises General Exercises - Lower Extremity Gluteal Sets: Supine, Strengthening, AROM, 10 reps Short Arc Quad: AROM, Supine, Strengthening, Left, 10 reps Long Arc Quad: AROM, Seated, Strengthening, Left, 10 reps Heel Slides: AROM, Supine, Strengthening, Left, 10 reps Hip ABduction/ADduction: AROM, Supine, Strengthening, Left, 10 reps (hip abduction slides) Straight Leg Raises: AROM, Supine, Strengthening, Left, 10 reps   Assessment/Plan    PT Assessment Patient needs continued PT services  PT Problem List Decreased strength;Decreased coordination;Pain;Decreased range of motion;Decreased activity tolerance;Decreased balance;Decreased mobility;Decreased knowledge of use of DME       PT Treatment Interventions DME  instruction;Therapeutic exercise;Gait training;Balance training;Neuromuscular re-education;Stair training;Functional mobility training;Therapeutic activities;Patient/family education    PT Goals (Current goals can be found in the Care Plan section)  Acute Rehab PT Goals Patient Stated Goal: go to rehab to get stronger & reduce fall risk before going home with wife. PT Goal Formulation: With patient Time For Goal Achievement: 11/18/22 Potential to Achieve Goals: Good    Frequency Min 3X/week     Co-evaluation               AM-PAC PT "6 Clicks" Mobility  Outcome Measure Help needed turning from your back to your side while in a flat bed without using bedrails?: None Help needed moving from lying on your back to sitting on the side of a flat bed without using bedrails?: None Help needed moving to and from a bed to a chair (including a wheelchair)?: A Little Help needed standing up from a chair using your arms (e.g., wheelchair or bedside chair)?: A Little Help needed to walk in hospital room?: A Little Help needed climbing 3-5 steps with a railing? : Total 6 Click Score: 18    End of Session   Activity Tolerance: Patient tolerated treatment well Patient left: in chair;with chair alarm set;with call bell/phone within reach Nurse Communication: Mobility status PT Visit Diagnosis: Muscle weakness (  generalized) (M62.81);Unsteadiness on feet (R26.81);Other abnormalities of gait and mobility (R26.89);Difficulty in walking, not elsewhere classified (R26.2)    Time: 1610-9604 PT Time Calculation (min) (ACUTE ONLY): 29 min   Charges:   PT Evaluation $PT Eval Low Complexity: 1 Low PT Treatments $Therapeutic Exercise: 8-22 mins        Aleda Grana, PT, DPT 11/04/22, 1:15 PM   Sandi Mariscal 11/04/2022, 1:12 PM

## 2022-11-04 NOTE — Progress Notes (Signed)
Subjective: 1 Day Post-Op Procedure(s) (LRB): Arthrodesis Left Ankle (Left) Medial and Lateral Malleolus Osteotomies (Left)  Patient reports pain as mild to moderate.  Tolerating POs well.  Denies fever, chills, N/V, CP, SOB.  Resting comfortably in bed.  Objective:   VITALS:  Temp:  [97.2 F (36.2 C)-98.6 F (37 C)] 97.5 F (36.4 C) (05/24 0623) Pulse Rate:  [58-72] 60 (05/24 0623) Resp:  [12-19] 14 (05/24 0623) BP: (117-165)/(68-95) 156/86 (05/24 0623) SpO2:  [95 %-100 %] 100 % (05/24 4098)  General: WDWN patient in NAD. Psych:  Appropriate mood and affect. Neuro:  A&O x 3, Moving all extremities, sensation intact to light touch HEENT:  EOMs intact Chest:  Even non-labored respirations Skin:  SLS D/I with small amount of dried blood on heel, no rashes or lesions Extremities: warm/dry, no visible edema, erythema or echymosis.  No lymphadenopathy. Pulses: Popliteus 2+ MSK:  ROM: EHL/FHL intact, MMT: able to perform quad set   LABS No results for input(s): "HGB", "WBC", "PLT" in the last 72 hours. No results for input(s): "NA", "K", "CL", "CO2", "BUN", "CREATININE", "GLUCOSE" in the last 72 hours. No results for input(s): "LABPT", "INR" in the last 72 hours.   Assessment/Plan: 1 Day Post-Op Procedure(s) (LRB): Arthrodesis Left Ankle (Left) Medial and Lateral Malleolus Osteotomies (Left)  NWB L LE Up with therapy DVT ppx:  Lovenox in house; transition to xarelto upon D/C Disp:  SNF D/C scripts on chart.  After searching [place] PMP Aware the patient is provided a Rx for oxycodone. Plan for 2 week outpatient post-op visit.  [name] PA-C [facility] Office:  [phone]

## 2022-11-04 NOTE — Discharge Summary (Signed)
Physician Discharge Summary  Patient ID: [name] [name] MRN: [id number] DOB/AGE: 02-27-1961 62 y.o.  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  Discharge Diagnoses:  Principal Problem:   Status post ankle arthrodesis Same as above  Discharged Condition: stable  Hospital Course: Patient is a 62 year old male that presented to [facility] day outpatient surgery for elective left ankle arthrodesis by Dr. Toni Arthurs on Nov 03, 2022.  He tolerated the procedure well without any complications.  He was found to have a hard time maintaining a nonweightbearing status safely.  He was then admitted to the hospital.  He worked with PT and OT.  He tolerated his stay well without any complications.  He is to be DC'd to SNF.  Consults:  PT/OT  Significant Diagnostic Studies: radiology: X-Ray: To ensure satisfactory anatomic alignment.  Treatments: IV hydration, antibiotics: Ancef and vancomycin, analgesia: acetaminophen, Dilaudid, and oxycodone, cardiac meds: lisinopril (Zestril), anticoagulation: lovenox, and surgery: as stated above.  Discharge Exam: Blood pressure 135/77, pulse 66, temperature 98.4 F (36.9 C), temperature source Oral, resp. rate 17, height 5\' 9"  (1.753 m), weight (!) 138 kg, SpO2 100 %. General: WDWN patient in NAD. Psych:  Appropriate mood and affect. Neuro:  A&O x 3, Moving all extremities, sensation intact to light touch HEENT:  EOMs intact Chest:  Even non-labored respirations Skin:  SLS D/I with small amount of dried blood on heel, no rashes or lesions Extremities: warm/dry, no visible edema, erythema or echymosis.   No lymphadenopathy. Pulses: Popliteus 2+ MSK:  ROM: EHL/FHL intact, MMT: able to perform quad set, (-) Homan's   Disposition: Discharge disposition: 03-Skilled Nursing Facility       Discharge Instructions     Call MD / Call 911   Complete by: As directed    If you experience chest pain or shortness of breath, CALL 911 and be transported to the hospital emergency room.  If you develope a fever above 101 F, pus (white drainage) or increased drainage or redness at the wound, or calf pain, call your surgeon's office.   Call MD / Call 911   Complete by: As directed    If you experience chest pain or shortness of breath, CALL 911 and be transported to the hospital emergency room.  If you develope a fever above 101 F, pus (white drainage) or increased drainage or redness at the wound, or calf pain, call your surgeon's office.   Constipation Prevention   Complete by: As directed    Drink plenty of fluids.  Prune juice may be helpful.  You may use a stool softener, such as Colace (over the counter) 100 mg twice a day.  Use MiraLax (over the counter) for constipation as needed.   Constipation Prevention   Complete by: As directed    Drink plenty of fluids.  Prune juice may be helpful.  You may use a stool softener, such as Colace (over the counter) 100 mg twice a day.  Use MiraLax (over the counter) for constipation as needed.   Diet - low sodium heart healthy   Complete by: As directed    Diet - low sodium heart healthy   Complete by: As directed    Increase activity slowly as tolerated   Complete by: As  directed    Increase activity slowly as tolerated   Complete by: As directed    Non weight bearing   Complete by: As directed    Laterality: left   Extremity: Lower   Post-operative opioid taper instructions:   Complete by: As directed    POST-OPERATIVE OPIOID TAPER INSTRUCTIONS: It is important to wean off of your opioid medication as soon as possible. If you do not need pain  medication after your surgery it is ok to stop day one. Opioids include: Codeine, Hydrocodone(Norco, Vicodin), Oxycodone(Percocet, oxycontin) and hydromorphone amongst others.  Long term and even short term use of opiods can cause: Increased pain response Dependence Constipation Depression Respiratory depression And more.  Withdrawal symptoms can include Flu like symptoms Nausea, vomiting And more Techniques to manage these symptoms Hydrate well Eat regular healthy meals Stay active Use relaxation techniques(deep breathing, meditating, yoga) Do Not substitute Alcohol to help with tapering If you have been on opioids for less than two weeks and do not have pain than it is ok to stop all together.  Plan to wean off of opioids This plan should start within one week post op of your joint replacement. Maintain the same interval or time between taking each dose and first decrease the dose.  Cut the total daily intake of opioids by one tablet each day Next start to increase the time between doses. The last dose that should be eliminated is the evening dose.      Post-operative opioid taper instructions:   Complete by: As directed    POST-OPERATIVE OPIOID TAPER INSTRUCTIONS: It is important to wean off of your opioid medication as soon as possible. If you do not need pain medication after your surgery it is ok to stop day one. Opioids include: Codeine, Hydrocodone(Norco, Vicodin), Oxycodone(Percocet, oxycontin) and hydromorphone amongst others.  Long term and even short term use of opiods can cause: Increased pain response Dependence Constipation Depression Respiratory depression And more.  Withdrawal symptoms can include Flu like symptoms Nausea, vomiting And more Techniques to manage these symptoms Hydrate well Eat regular healthy meals Stay active Use relaxation techniques(deep breathing, meditating, yoga) Do Not substitute Alcohol to help with tapering If you have been  on opioids for less than two weeks and do not have pain than it is ok to stop all together.  Plan to wean off of opioids This plan should start within one week post op of your joint replacement. Maintain the same interval or time between taking each dose and first decrease the dose.  Cut the total daily intake of opioids by one tablet each day Next start to increase the time between doses. The last dose that should be eliminated is the evening dose.         Allergies as of 11/09/2022   No Known Allergies      Medication List     STOP taking these medications    Aspirin Low Dose 81 MG tablet Generic drug: aspirin EC   HYDROcodone-acetaminophen 10-325 MG tablet Commonly known as: NORCO   methotrexate 2.5 MG tablet Commonly known as: RHEUMATREX   Rinvoq 15 MG Tb24 Generic drug: Upadacitinib ER       TAKE these medications    ascorbic acid 500 MG tablet Commonly known as: VITAMIN C Take 500 mg by mouth daily.   atorvastatin 80 MG tablet Commonly known as: LIPITOR Take 40 mg by mouth daily.   cyclobenzaprine 10 MG tablet Commonly known as: FLEXERIL Take 1 tablet (10  mg total) by mouth 3 (three) times daily as needed for muscle spasms.   diclofenac 75 MG EC tablet Commonly known as: VOLTAREN Take 75 mg by mouth as needed for moderate pain.   docusate sodium 100 MG capsule Commonly known as: Colace Take 1 capsule (100 mg total) by mouth 2 (two) times daily. While taking narcotic pain medicine.   Fluticasone-Salmeterol 250-50 MCG/DOSE Aepb Commonly known as: ADVAIR Inhale 1 puff into the lungs 2 (two) times daily.   folic acid 1 MG tablet Commonly known as: FOLVITE Take 1 mg by mouth daily.   gabapentin 600 MG tablet Commonly known as: NEURONTIN Take 600 mg by mouth 2 (two) times daily.   lisinopril 20 MG tablet Commonly known as: ZESTRIL Take 20 mg by mouth daily.   montelukast 10 MG tablet Commonly known as: SINGULAIR Take 10 mg by mouth at  bedtime.   MULTIVITAMIN ADULT PO Take by mouth.   omeprazole 20 MG capsule Commonly known as: PRILOSEC Take 20 mg by mouth daily.   oxyCODONE 5 MG immediate release tablet Commonly known as: Roxicodone Take 1 tablet (5 mg total) by mouth every 4 (four) hours as needed for up to 5 days for severe pain.   rivaroxaban 10 MG Tabs tablet Commonly known as: Xarelto Take 1 tablet (10 mg total) by mouth daily.   senna 8.6 MG Tabs tablet Commonly known as: SENOKOT Take 1 tablet (8.6 mg total) by mouth daily.   sildenafil 100 MG tablet Commonly known as: VIAGRA Take 100 mg by mouth as needed for erectile dysfunction.   Vitamin D3 125 MCG (5000 UT) Caps Take 5,000 Units by mouth daily.   zolpidem 10 MG tablet Commonly known as: AMBIEN Take 10 mg by mouth at bedtime.               Discharge Care Instructions  (From admission, onward)           Start     Ordered   11/09/22 0000  Non weight bearing       Question Answer Comment  Laterality left   Extremity Lower      11/09/22 1504            Follow-up Information     Toni Arthurs, MD. Schedule an appointment as soon as possible for a visit in 2 week(s).   Specialty: Orthopedic Surgery Contact information: [address] [place] [address] [phone]                 Signed: Lolly Mustache Office:  [phone]

## 2022-11-04 NOTE — Evaluation (Signed)
Occupational Therapy Evaluation Patient Details Name: [name] [name] MRN: [id number] DOB: [birth year] Today's Date: 11/04/2022   History of Present Illness Pt is a 62 y/o M admitted on 11/03/22 for scheduled L ankle arthrodesis & L ankle medial & lateral malleolus osteotomies. PMH: asthma, a-fib, essential HTN, GERD, HLD, obesity, PNA, psoriatic arthritis, sleep apnea   Clinical Impression   Pt admitted for above dx, PTA patient lived with spouse and reports being Mod I recently using RW and Rollator to assist with ambulation, his wife helps with lower body dressing at baseline. Pt currently presenting with impaired balance and LLE pain. Pt needing Min assist to Mod A to complete STS and total assist for lower body dressing. Once standing pt is able to complete hop to gait with CGA following two trials. STS did improve to Min A following proper hand placement. Pt wife is unable to provide the level of care needed to aid patient in functional mobility due to lifting restrictions. Pt would benefit from continued acute skilled OT services to address above/below deficits and help transition to next level of care. Patient would benefit from post acute skilled rehab facility with <3 hours of therapy and 24/7 support at this time due to decrease capacity for caregiver support.      Recommendations for follow up therapy are one component of a multi-disciplinary discharge planning process, led by the attending physician.  Recommendations may be updated based on patient status, additional functional criteria and insurance authorization.   Assistance Recommended at Discharge Frequent or constant Supervision/Assistance  Patient can return home with the following A little help with walking and/or transfers;A lot of help with bathing/dressing/bathroom;Assistance with cooking/housework;Help with stairs or ramp for entrance;Assist for transportation    Functional Status Assessment  Patient has had a recent decline in  their functional status and demonstrates the ability to make significant improvements in function in a reasonable and predictable amount of time.  Equipment Recommendations  Other (comment) (defer to next level of care)    Recommendations for Other Services       Precautions / Restrictions Precautions Precautions: Fall Restrictions Weight Bearing Restrictions: Yes LLE Weight Bearing: Non weight bearing      Mobility Bed Mobility               General bed mobility comments: pt rec'd and left sitting in recliner, not fully assessed    Transfers Overall transfer level: Needs assistance Equipment used: Rolling walker (2 wheels) Transfers: Sit to/from Stand Sit to Stand: Min assist           General transfer comment: Initial STS Mod A +2, after repositioning hand placement pt completed STS with CGA + push off arm rest      Balance Overall balance assessment: Needs assistance Sitting-balance support: Feet supported Sitting balance-Leahy Scale: Good     Standing balance support: During functional activity, Bilateral upper extremity supported, Reliant on assistive device for balance Standing balance-Leahy Scale: Poor                             ADL either performed or assessed with clinical judgement   ADL Overall ADL's : Needs assistance/impaired Eating/Feeding: Independent   Grooming: Sitting;Supervision/safety;Set up   Upper Body Bathing: Sitting;Independent   Lower Body Bathing: Sitting/lateral leans;Moderate assistance   Upper Body Dressing : Sitting;Independent   Lower Body Dressing: Sitting/lateral leans;Total assistance Lower Body Dressing Details (indicate cue type and reason): wife assists at baseline  Toilet Transfer: Ambulation;Rolling walker (2 wheels);Minimal assistance   Toileting- Clothing Manipulation and Hygiene: Min guard;Sitting/lateral lean   Tub/ Shower Transfer: Ambulation;Moderate assistance   Functional mobility during  ADLs: Min guard;Rolling walker (2 wheels) (took a few steps just beyond bedside)       Vision         Perception     Praxis      Pertinent Vitals/Pain Pain Assessment Pain Assessment: 0-10 Pain Score: 5  Pain Location: L ankle Pain Descriptors / Indicators: Discomfort Pain Intervention(s): Monitored during session, Repositioned     Hand Dominance     Extremity/Trunk Assessment Upper Extremity Assessment Upper Extremity Assessment: Overall WFL for tasks assessed   Lower Extremity Assessment Lower Extremity Assessment: LLE deficits/detail LLE Deficits / Details: not formally assessed but 3/5 knee extension in sitting   Cervical / Trunk Assessment Cervical / Trunk Assessment: Normal   Communication Communication Communication: No difficulties   Cognition Arousal/Alertness: Awake/alert Behavior During Therapy: WFL for tasks assessed/performed Overall Cognitive Status: Within Functional Limits for tasks assessed                                 General Comments: Pt with good understanding of NWB precautions. Verbalizes good problem solving skills     General Comments  VSS on RA    Exercises     Shoulder Instructions      Home Living Family/patient expects to be discharged to:: Skilled nursing facility Living Arrangements: Spouse/significant other Available Help at Discharge: Family;Available PRN/intermittently (wife works during the day) Type of Home: House Home Access: Stairs to enter [employer] of Steps: 2 Entrance Stairs-Rails: None Home Layout: One level               Home Equipment: Agricultural consultant (2 wheels);Rollator (4 wheels)          Prior Functioning/Environment Prior Level of Function : Independent/Modified Independent;Working/employed;Driving             Mobility Comments: Pt was working but has been in/out of work for the past 2 years 2/2 multiple injuries/issues. Pt started using RW or rollator (as knee  scooter) for the past 12 weeks to minimize weight bearing on LLE. No falls. ADLs Comments: wife assists with lower body dressing at baseline. ind in other bADLs/iADLs        OT Problem List: Decreased activity tolerance;Impaired balance (sitting and/or standing);Pain      OT Treatment/Interventions: Balance training;Self-care/ADL training;Therapeutic exercise;Therapeutic activities;Patient/family education    OT Goals(Current goals can be found in the care plan section) Acute Rehab OT Goals Patient Stated Goal: To go to rehab OT Goal Formulation: With patient Time For Goal Achievement: 11/18/22 Potential to Achieve Goals: Good ADL Goals Pt Will Perform Grooming: standing;with supervision Pt Will Perform Lower Body Bathing: sitting/lateral leans;with supervision;with set-up (while protecting LLE, using AE prn) Pt Will Transfer to Toilet: ambulating;with supervision  OT Frequency: Min 2X/week    Co-evaluation              AM-PAC OT "6 Clicks" Daily Activity     Outcome Measure Help from another person eating meals?: None Help from another person taking care of personal grooming?: A Little Help from another person toileting, which includes using toliet, bedpan, or urinal?: A Lot Help from another person bathing (including washing, rinsing, drying)?: A Lot Help from another person to put on and taking off regular upper body clothing?: None Help  from another person to put on and taking off regular lower body clothing?: Total 6 Click Score: 16   End of Session Equipment Utilized During Treatment: Gait belt;Rolling walker (2 wheels) Nurse Communication: Weight bearing status;Mobility status (LLE NWB)  Activity Tolerance: Patient tolerated treatment well Patient left: in chair;with call bell/phone within reach;with chair alarm set  OT Visit Diagnosis: Unsteadiness on feet (R26.81);Pain;Other abnormalities of gait and mobility (R26.89) Pain - Right/Left: Left Pain - part of  body: Leg                Time: 1352-1415 OT Time Calculation (min): 23 min Charges:  OT General Charges $OT Visit: 1 Visit OT Evaluation $OT Eval Moderate Complexity: 1 Mod OT Treatments $Therapeutic Activity: 8-22 mins  11/04/2022  AB, OTR/L  Acute Rehabilitation Services  Office: [phone]   Tristan Schroeder 11/04/2022, 4:35 PM

## 2022-11-04 NOTE — Progress Notes (Signed)
Pt home CPAP unit set up. Pt able to place self on/off.

## 2022-11-05 DIAGNOSIS — S82842P Displaced bimalleolar fracture of left lower leg, subsequent encounter for closed fracture with malunion: Secondary | ICD-10-CM | POA: Diagnosis not present

## 2022-11-05 NOTE — TOC Initial Note (Addendum)
Transition of Care [facility]) - Initial/Assessment Note    Patient Details  Name: [name] [name] MRN: [id number] Date of Birth: January 04, 1961  Transition of Care [facility]) CM/SW Contact:    Georgie Chard, LCSW Phone Number: 11/05/2022, 2:57 PM  Clinical Narrative:                 CSW spoke to wife of the patient. AT this time the wife would like the patient to go through the facility [address] located in [place].  This CSW has reached out to the facility to speak with CM about reviewing the patient. At this time the CM is not available. This CSW was told that the ADMIN will not return until Tuesday and to contact [facility] @ [phone] Fax # [phone]. The Facility staff member reported she is unaware of this patient so patient may still need to reviewed. This CSW has reached out to providers to make them aware due to patient be DC at this time. TOC will need to follow up on Tuesday as Monday is a Holiday.    Please reach out to the patient's wife with any updates as soon as possible.  Expected Discharge Plan: Skilled Nursing Facility Barriers to Discharge: SNF Pending discharge summary   Patient Goals and CMS Choice Patient states their goals for this hospitalization and ongoing recovery are:: Patient would like to go to SNF in [place]          Expected Discharge Plan and Services       Living arrangements for the past 2 months: Single Family Home Expected Discharge Date: 11/05/22                                    Prior Living Arrangements/Services Living arrangements for the past 2 months: Single Family Home Lives with:: Domestic Partner Patient language and need for interpreter reviewed:: No        Need for Family Participation in Patient Care: No (Comment) Care giver support system in place?: Yes (comment)   Criminal Activity/Legal Involvement Pertinent to Current Situation/Hospitalization: No - Comment as needed  Activities of Daily Living Home  Assistive Devices/Equipment: Environmental consultant (specify type), Art gallery manager, Shower chair with back ADL Screening (condition at time of admission) Patient's cognitive ability adequate to safely complete daily activities?: Yes Is the patient deaf or have difficulty hearing?: No Does the patient have difficulty seeing, even when wearing glasses/contacts?: No Does the patient have difficulty concentrating, remembering, or making decisions?: No Patient able to express need for assistance with ADLs?: Yes Does the patient have difficulty dressing or bathing?: Yes Independently performs ADLs?: Yes (appropriate for developmental age) Does the patient have difficulty walking or climbing stairs?: Yes Weakness of Legs: Left Weakness of Arms/Hands: None  Permission Sought/Granted                  Emotional Assessment       Orientation: : Oriented to Self, Oriented to Place, Oriented to  Time, Oriented to Situation, Fluctuating Orientation (Suspected and/or reported Sundowners) Alcohol / Substance Use: Not Applicable Psych Involvement: No (comment)  Admission diagnosis:  Status post ankle arthrodesis [Z98.1] Patient Active Problem List   Diagnosis Date Noted   Status post ankle arthrodesis 11/03/2022   Closed fracture of left ankle 09/16/2022   At low risk for open-angle glaucoma in both eyes 09/06/2022   Spondylolisthesis of lumbar region 07/05/2022   Cellulitis  03/15/2022   Impaired glucose tolerance 08/08/2021   GERD (gastroesophageal reflux disease) 08/07/2021   Hypokalemia 08/07/2021   Psoriatic arthritis (HCC) 08/07/2021   Insomnia 08/07/2021   PAF (paroxysmal atrial fibrillation) (HCC) 08/07/2021   Obesity, Class III, BMI 40-49.9 (morbid obesity) (HCC) 08/07/2021   Hypomagnesemia 08/07/2021   Post-traumatic wound infection    Cellulitis of right lower leg 08/06/2021   Asthma, chronic 08/06/2021   Coagulopathy (HCC) 08/18/2019   Atrial fibrillation with RVR (HCC) 08/17/2019    COVID-19    Hypoxia    Immune deficiency disorder (HCC)    Empyema (HCC) 03/20/2014   Empyema of right pleural space (HCC) 03/18/2014   CAP (community acquired pneumonia) 03/14/2014   Hydronephrosis, left 03/14/2014   Back pain 03/14/2014   Hypertension    OSA on CPAP    HLD (hyperlipidemia)    PCP:  Arlina Robes, MD Pharmacy:   [facility] [phone] [place], [place] - [address] [facility] [address] [place] [address] Phone: [phone] Fax: [phone]     Social Determinants of Health (SDOH) Social History: SDOH Screenings   Food Insecurity: Patient Declined (03/16/2022)  Housing: Low Risk  (03/16/2022)  Transportation Needs: No Transportation Needs (03/16/2022)  Utilities: Not At Risk (03/16/2022)  Tobacco Use: Low Risk  (11/04/2022)   SDOH Interventions:     Readmission Risk Interventions    11/05/2022    2:55 PM  Readmission Risk Prevention Plan  Post Dischage Appt Complete  Medication Screening Complete  Transportation Screening Complete

## 2022-11-05 NOTE — Progress Notes (Signed)
   Subjective: 2 Days Post-Op Procedure(s) (LRB): Arthrodesis Left Ankle (Left) Medial and Lateral Malleolus Osteotomies (Left)  Pt doing well this morning Mild pain to left heel Ready for d/c to SNF today Denies any new symptoms or issues Patient reports pain as mild.  Objective:   VITALS:   Vitals:   11/05/22 0358 11/05/22 0751  BP: 139/79 134/83  Pulse: 62 (!) 51  Resp: 17 17  Temp: 98 F (36.7 C) 98.4 F (36.9 C)  SpO2: 94% 91%    Left foot/ankle: dressing and splint intact Nv intact distally No rashes or edema Pt able to move toes and left knee knee well  LABS No results for input(s): "HGB", "HCT", "WBC", "PLT" in the last 72 hours.  No results for input(s): "NA", "K", "BUN", "CREATININE", "GLUCOSE" in the last 72 hours.   Assessment/Plan: 2 Days Post-Op Procedure(s) (LRB): Arthrodesis Left Ankle (Left) Medial and Lateral Malleolus Osteotomies (Left) Plan for d/c to SNF today F/u in 2 weeks in the office Non weight bearing left lower leg    Alphonsa Overall PA-C, MPAS [facility] is now [facility] [employer] [facility] [address], [place], [place] [address] Phone: [phone] [email or web address] Facebook  [employer]

## 2022-11-05 NOTE — Discharge Summary (Signed)
In most cases prophylactic antibiotics for Dental procdeures after total joint surgery are not necessary.  Exceptions are as follows:  1. History of prior total joint infection  2. Severely immunocompromised (Organ Transplant, cancer chemotherapy, Rheumatoid biologic meds such as Humera)  3. Poorly controlled diabetes (A1C &gt; 8.0, blood glucose over 200)  If you have one of these conditions, contact your surgeon for an antibiotic prescription, prior to your dental procedure. Orthopedic Discharge Summary        Physician Discharge Summary  Patient ID: [name] [name] MRN: [id number] DOB/AGE: 1961/03/05 62 y.o.  Admit date: 11/03/2022 Discharge date: 11/05/2022   Procedures:  Procedure(s) (LRB): Arthrodesis Left Ankle (Left) Medial and Lateral Malleolus Osteotomies (Left)  Attending Physician:  Dr. Victorino Dike  Admission Diagnoses:   left ankle osteoarthritis  Discharge Diagnoses:  left ankle osteoarthritis    Past Medical History:  Diagnosis Date   Asthma    Atrial fibrillation [facility])    Documented March 2021; pt states this was during Covid +, side effect   Essential hypertension    GERD (gastroesophageal reflux disease)    History of kidney stones    History of nephrolithiasis    Hyperlipemia    Obesity    BMI 42.6   Pneumonia    8 years ago   Psoriasis    Psoriatic arthritis (HCC)    Seasonal allergies    Sleep apnea     PCP: Arlina Robes, MD   Discharged Condition: good  Hospital Course:  Patient underwent the above stated procedure on 11/03/2022. Patient tolerated the procedure well and brought to the recovery room in good condition and subsequently to the floor. Patient had an uncomplicated hospital course and was stable for discharge.   Disposition: Discharge disposition: 03-Skilled Nursing Facility      with follow up in 2 weeks    Follow-up Information     Toni Arthurs, MD. Schedule an appointment as soon as possible for a visit in 2  week(s).   Specialty: Orthopedic Surgery Contact information: [address] [place] [address] [phone]                 Dental Antibiotics:  In most cases prophylactic antibiotics for Dental procdeures after total joint surgery are not necessary.  Exceptions are as follows:  1. History of prior total joint infection  2. Severely immunocompromised (Organ Transplant, cancer chemotherapy, Rheumatoid biologic meds such as Humera)  3. Poorly controlled diabetes (A1C &gt; 8.0, blood glucose over 200)  If you have one of these conditions, contact your surgeon for an antibiotic prescription, prior to your dental procedure.  Discharge Instructions     Call MD / Call 911   Complete by: As directed    If you experience chest pain or shortness of breath, CALL 911 and be transported to the hospital emergency room.  If you develope a fever above 101 F, pus (white drainage) or increased drainage or redness at the wound, or calf pain, call your surgeon's office.   Constipation Prevention   Complete by: As directed    Drink plenty of fluids.  Prune juice may be helpful.  You may use a stool softener, such as Colace (over the counter) 100 mg twice a day.  Use MiraLax (over the counter) for constipation as needed.   Diet - low sodium heart healthy   Complete by: As directed    Increase activity slowly as tolerated   Complete by: As directed  Post-operative opioid taper instructions:   Complete by: As directed    POST-OPERATIVE OPIOID TAPER INSTRUCTIONS: It is important to wean off of your opioid medication as soon as possible. If you do not need pain medication after your surgery it is ok to stop day one. Opioids include: Codeine, Hydrocodone(Norco, Vicodin), Oxycodone(Percocet, oxycontin) and hydromorphone amongst others.  Long term and even short term use of opiods can cause: Increased pain response Dependence Constipation Depression Respiratory  depression And more.  Withdrawal symptoms can include Flu like symptoms Nausea, vomiting And more Techniques to manage these symptoms Hydrate well Eat regular healthy meals Stay active Use relaxation techniques(deep breathing, meditating, yoga) Do Not substitute Alcohol to help with tapering If you have been on opioids for less than two weeks and do not have pain than it is ok to stop all together.  Plan to wean off of opioids This plan should start within one week post op of your joint replacement. Maintain the same interval or time between taking each dose and first decrease the dose.  Cut the total daily intake of opioids by one tablet each day Next start to increase the time between doses. The last dose that should be eliminated is the evening dose.          Allergies as of 11/05/2022   No Known Allergies      Medication List     STOP taking these medications    Aspirin Low Dose 81 MG tablet Generic drug: aspirin EC   HYDROcodone-acetaminophen 10-325 MG tablet Commonly known as: NORCO   methotrexate 2.5 MG tablet Commonly known as: RHEUMATREX   Rinvoq 15 MG Tb24 Generic drug: Upadacitinib ER       TAKE these medications    ascorbic acid 500 MG tablet Commonly known as: VITAMIN C Take 500 mg by mouth daily.   atorvastatin 80 MG tablet Commonly known as: LIPITOR Take 40 mg by mouth daily.   cyclobenzaprine 10 MG tablet Commonly known as: FLEXERIL Take 1 tablet (10 mg total) by mouth 3 (three) times daily as needed for muscle spasms.   diclofenac 75 MG EC tablet Commonly known as: VOLTAREN Take 75 mg by mouth as needed for moderate pain.   docusate sodium 100 MG capsule Commonly known as: Colace Take 1 capsule (100 mg total) by mouth 2 (two) times daily. While taking narcotic pain medicine.   Fluticasone-Salmeterol 250-50 MCG/DOSE Aepb Commonly known as: ADVAIR Inhale 1 puff into the lungs 2 (two) times daily.   folic acid 1 MG  tablet Commonly known as: FOLVITE Take 1 mg by mouth daily.   gabapentin 600 MG tablet Commonly known as: NEURONTIN Take 600 mg by mouth 2 (two) times daily.   lisinopril 20 MG tablet Commonly known as: ZESTRIL Take 20 mg by mouth daily.   montelukast 10 MG tablet Commonly known as: SINGULAIR Take 10 mg by mouth at bedtime.   MULTIVITAMIN ADULT PO Take by mouth.   omeprazole 20 MG capsule Commonly known as: PRILOSEC Take 20 mg by mouth daily.   oxyCODONE 5 MG immediate release tablet Commonly known as: Roxicodone Take 1 tablet (5 mg total) by mouth every 4 (four) hours as needed for up to 5 days for severe pain.   rivaroxaban 10 MG Tabs tablet Commonly known as: Xarelto Take 1 tablet (10 mg total) by mouth daily.   senna 8.6 MG Tabs tablet Commonly known as: SENOKOT Take 1 tablet (8.6 mg total) by mouth daily.   sildenafil 100  MG tablet Commonly known as: VIAGRA Take 100 mg by mouth as needed for erectile dysfunction.   Vitamin D3 125 MCG (5000 UT) Caps Take 5,000 Units by mouth daily.   zolpidem 10 MG tablet Commonly known as: AMBIEN Take 10 mg by mouth at bedtime.          Signed: Thea Gist 11/05/2022, 8:54 AM  [facility] is now [facility] [employer] [facility] [address], [address], [place], [place] [address] Phone: [phone] Facebook  Instagram  [employer]

## 2022-11-06 DIAGNOSIS — S82842P Displaced bimalleolar fracture of left lower leg, subsequent encounter for closed fracture with malunion: Secondary | ICD-10-CM | POA: Diagnosis not present

## 2022-11-06 NOTE — Progress Notes (Addendum)
   Subjective: 3 Days Post-Op Procedure(s) (LRB): Arthrodesis Left Ankle (Left) Medial and Lateral Malleolus Osteotomies (Left)  Pt doing well this morning No pain to left heel Denies any new symptoms or issues Patient reports pain as mild.  Objective:   VITALS:   Vitals:   11/06/22 0418 11/06/22 0741  BP: (!) 160/86 (!) 141/77  Pulse: 61 62  Resp: 18 16  Temp: 98.1 F (36.7 C) 98.4 F (36.9 C)  SpO2: 99% 98%    Left foot/ankle: dressing and splint intact Nv intact distally No rashes or edema Pt able to move toes and left knee knee well  LABS No results for input(s): "HGB", "HCT", "WBC", "PLT" in the last 72 hours.  No results for input(s): "NA", "K", "BUN", "CREATININE", "GLUCOSE" in the last 72 hours.   Assessment/Plan: 3 Days Post-Op Procedure(s) (LRB): Arthrodesis Left Ankle (Left) Medial and Lateral Malleolus Osteotomies (Left) Plan for d/c to SNF Tues F/u in 2 weeks in the office Non weight bearing left lower leg  Netta Cedars, MD Orthopaedic Surgery [facility]

## 2022-11-06 NOTE — Progress Notes (Signed)
Patient home unit NIV was set up per patient request. Patient states just needed humidification in it. No assistance is needed throughout the night. Machine is at bedside

## 2022-11-06 NOTE — Progress Notes (Signed)
Placed patient on home CPAP unit for the night.  

## 2022-11-07 DIAGNOSIS — S82842P Displaced bimalleolar fracture of left lower leg, subsequent encounter for closed fracture with malunion: Secondary | ICD-10-CM | POA: Diagnosis not present

## 2022-11-07 NOTE — Progress Notes (Signed)
Patient home unit NIV was set up per patient request. Patient states just needed humidification in it. No assistance is needed throughout the night. Machine was placed at bedside

## 2022-11-07 NOTE — TOC Progression Note (Signed)
Transition of Care [facility]) - Progression Note    Patient Details  Name: [name] [name] MRN: [id number] Date of Birth: 1960-10-16  Transition of [facility]) CM/SW Contact  Carmina Miller, LCSWA Phone Number: 11/07/2022, 2:29 PM  Clinical Narrative:     CSW spoke with pt's spouse, she was concerned about pt being dc home and not SNF,  this CSW confirmed the referral to [facility] was faxed today and it should be reviewed tomorrow when the admissions office reopens. CSW advised pt's spouse she should receive a call from CSW [place] with more information. TOC will continue to follow.    Expected Discharge Plan: Skilled Nursing Facility Barriers to Discharge: SNF Pending discharge summary  Expected Discharge Plan and Services       Living arrangements for the past 2 months: Single Family Home Expected Discharge Date: 11/05/22                                     Social Determinants of Health (SDOH) Interventions SDOH Screenings   Food Insecurity: Patient Declined (03/16/2022)  Housing: Low Risk  (03/16/2022)  Transportation Needs: No Transportation Needs (03/16/2022)  Utilities: Not At Risk (03/16/2022)  Tobacco Use: Low Risk  (11/04/2022)    Readmission Risk Interventions    11/05/2022    2:55 PM  Readmission Risk Prevention Plan  Post Dischage Appt Complete  Medication Screening Complete  Transportation Screening Complete

## 2022-11-07 NOTE — NC FL2 (Signed)
[place] MEDICAID FL2 LEVEL OF CARE FORM     IDENTIFICATION  Patient Name: [name] [name] Birthdate: [birth year] Sex: male Admission Date (Current Location): 11/03/2022  [facility] and [place] Number:  Producer, television/film/video and Address:  [facility], [address] [place], [place] [address]      Provider Number: [id number]  Attending Physician Name and Address:  Toni Arthurs, MD  Relative Name and Phone Number:  Faris Sollars [phone]    Current Level of Care: Hospital Recommended Level of Care: Skilled Nursing Facility Prior Approval Number:    Date Approved/Denied:   PASRR Number:    Discharge Plan:      Current Diagnoses: Patient Active Problem List   Diagnosis Date Noted   Status post ankle arthrodesis 11/03/2022   Closed fracture of left ankle 09/16/2022   At low risk for open-angle glaucoma in both eyes 09/06/2022   Spondylolisthesis of lumbar region 07/05/2022   Cellulitis 03/15/2022   Impaired glucose tolerance 08/08/2021   GERD (gastroesophageal reflux disease) 08/07/2021   Hypokalemia 08/07/2021   Psoriatic arthritis (HCC) 08/07/2021   Insomnia 08/07/2021   PAF (paroxysmal atrial fibrillation) (HCC) 08/07/2021   Obesity, Class III, BMI 40-49.9 (morbid obesity) (HCC) 08/07/2021   Hypomagnesemia 08/07/2021   Post-traumatic wound infection    Cellulitis of right lower leg 08/06/2021   Asthma, chronic 08/06/2021   Coagulopathy (HCC) 08/18/2019   Atrial fibrillation with RVR (HCC) 08/17/2019   COVID-19    Hypoxia    Immune deficiency disorder (HCC)    Empyema (HCC) 03/20/2014   Empyema of right pleural space (HCC) 03/18/2014   CAP (community acquired pneumonia) 03/14/2014   Hydronephrosis, left 03/14/2014   Back pain 03/14/2014   Hypertension    OSA on CPAP    HLD (hyperlipidemia)     Orientation RESPIRATION BLADDER Height & Weight     Place, Time, Self, Situation  Normal Continent Weight: (!) 304 lb 3.8 oz (138 kg) Height:   5\' 9"  (175.3 cm)  BEHAVIORAL SYMPTOMS/MOOD NEUROLOGICAL BOWEL NUTRITION STATUS      Continent Diet (see dc summary)  AMBULATORY STATUS COMMUNICATION OF NEEDS Skin   Extensive Assist Verbally Surgical wounds (Closed incision, leg, 11/05/22)                       Personal Care Assistance Level of Assistance  Bathing, Feeding, Dressing Bathing Assistance: Limited assistance Feeding assistance: Independent Dressing Assistance: Limited assistance     Functional Limitations Info  Sight, Hearing, Speech Sight Info: Adequate Hearing Info: Adequate Speech Info: Adequate    SPECIAL CARE FACTORS FREQUENCY  OT (By licensed OT), PT (By licensed PT)     PT Frequency: 5x week OT Frequency: 5x week            Contractures Contractures Info: Not present    Additional Factors Info  Code Status, Allergies, Psychotropic Code Status Info: Full Allergies Info: NKA Psychotropic Info: 2X DAILY         Current Medications (11/07/2022):  This is the current hospital active medication list Current Facility-Administered Medications  Medication Dose Route Frequency Provider Last Rate Last Admin   0.9 %  sodium chloride infusion   Intravenous Continuous Jacinta Shoe, PA-C       acetaminophen (TYLENOL) tablet 325-650 mg  325-650 mg Oral Q6H PRN Jacinta Shoe, PA-C       atorvastatin (LIPITOR) tablet 40 mg  40 mg Oral QHS Jacinta Shoe, [place]  40 mg at 11/06/22 2052   cyclobenzaprine (FLEXERIL) tablet 10 mg  10 mg Oral TID PRN Jacinta Shoe, PA-C       diphenhydrAMINE (BENADRYL) 12.5 MG/5ML elixir 12.5-25 mg  12.5-25 mg Oral Q4H PRN Jacinta Shoe, PA-C       docusate sodium (COLACE) capsule 100 mg  100 mg Oral BID Jacinta Shoe, PA-C   100 mg at 11/07/22 0856   enoxaparin (LOVENOX) injection 40 mg  40 mg Subcutaneous Q24H Jacinta Shoe, PA-C   40 mg at 11/07/22 1610   gabapentin (NEURONTIN) capsule 600 mg  600 mg Oral BID Jacinta Shoe, PA-C   600 mg at  11/07/22 9604   HYDROmorphone (DILAUDID) injection 0.5-1 mg  0.5-1 mg Intravenous Q4H PRN Jacinta Shoe, PA-C       HYDROmorphone (DILAUDID) tablet 2-3 mg  2-3 mg Oral Q4H PRN Jacinta Shoe, PA-C   2 mg at 11/07/22 0216   lisinopril (ZESTRIL) tablet 20 mg  20 mg Oral Daily Jacinta Shoe, PA-C   20 mg at 11/07/22 5409   mometasone-formoterol (DULERA) 200-5 MCG/ACT inhaler 2 puff  2 puff Inhalation BID Jacinta Shoe, PA-C   2 puff at 11/07/22 0857   montelukast (SINGULAIR) tablet 10 mg  10 mg Oral QHS Jacinta Shoe, PA-C   10 mg at 11/06/22 2052   ondansetron (ZOFRAN) tablet 4 mg  4 mg Oral Q6H PRN Jacinta Shoe, PA-C       Or   ondansetron [facility]) injection 4 mg  4 mg Intravenous Q6H PRN Jacinta Shoe, PA-C       oxyCODONE (Oxy IR/ROXICODONE) immediate release tablet 5-10 mg  5-10 mg Oral Q4H PRN Jacinta Shoe, PA-C   10 mg at 11/07/22 0856   pantoprazole (PROTONIX) EC tablet 40 mg  40 mg Oral Daily Jacinta Shoe, PA-C   40 mg at 11/07/22 8119   senna (SENOKOT) tablet 8.6 mg  1 tablet Oral BID Jacinta Shoe, PA-C   8.6 mg at 11/07/22 0856   zolpidem (AMBIEN) tablet 10 mg  10 mg Oral QHS Jacinta Shoe, PA-C   10 mg at 11/06/22 2052     Discharge Medications: Please see discharge summary for a list of discharge medications.  Relevant Imaging Results:  Relevant Lab Results:   Additional Information SSN [id number]  Carmina Miller, LCSWA

## 2022-11-07 NOTE — Progress Notes (Signed)
Physical Therapy Treatment Patient Details Name: [name] [name] MRN: [id number] DOB: [birth year] Today's Date: 11/07/2022   History of Present Illness Pt is a 62 y/o M admitted on 11/03/22 for scheduled L ankle arthrodesis & L ankle medial & lateral malleolus osteotomies. PMH: asthma, a-fib, essential HTN, GERD, HLD, obesity, PNA, psoriatic arthritis, sleep apnea    PT Comments    Pt received in supine and agreeable to session. Pt requesting to use the bathroom at the beginning of the session. Pt able to ambulate the the bathroom with a hop-to pattern and min guard for safety. Pt with good adherence to LLE NWB precautions throughout. Pt becoming fatigued quickly and requiring standing rest breaks. Pt hopping on RLE instead of extending BUEs and gliding forward despite cues with pt reporting R hip pain at the end of the gait trial. Therapist demonstrated hop-to technique with RW and instructed pt to perform seated chair pushups for strengthening. Pt educated on benefits of increased practice with ambulation and encouraged to ambulate to the bathroom with nursing staff instead of using the transport chair, RN notified. Pt continues to benefit from PT services to progress toward functional mobility goals.     Recommendations for follow up therapy are one component of a multi-disciplinary discharge planning process, led by the attending physician.  Recommendations may be updated based on patient status, additional functional criteria and insurance authorization.     Assistance Recommended at Discharge Intermittent Supervision/Assistance  Patient can return home with the following A little help with walking and/or transfers;Assistance with cooking/housework;A little help with bathing/dressing/bathroom;Assist for transportation;Help with stairs or ramp for entrance   Equipment Recommendations  None recommended by PT (TBD in next venue)    Recommendations for Other Services       Precautions /  Restrictions Precautions Precautions: Fall Restrictions Weight Bearing Restrictions: Yes LLE Weight Bearing: Non weight bearing     Mobility  Bed Mobility Overal bed mobility: Modified Independent Bed Mobility: Supine to Sit     Supine to sit: Modified independent (Device/Increase time), HOB elevated          Transfers Overall transfer level: Needs assistance Equipment used: Rolling walker (2 wheels) Transfers: Sit to/from Stand Sit to Stand: Min guard           General transfer comment: From elevated EOB and low toilet with min guard and cues for hand placement. Pt maintaining LLE NWB    Ambulation/Gait Ambulation/Gait assistance: Min guard Gait Distance (Feet): 10 Feet (x2) Assistive device: Rolling walker (2 wheels) Gait Pattern/deviations: Step-to pattern (hop-to)       General Gait Details: Pt demonstrating a hop-to pattern with cues for increased BUE support for decreased hopping on RLE, but pt unable to significantly improve. Good maintinence of LLE NWB and no LOB.       Balance Overall balance assessment: Needs assistance Sitting-balance support: Feet supported Sitting balance-Leahy Scale: Good Sitting balance - Comments: sitting EOB   Standing balance support: During functional activity, Bilateral upper extremity supported, Reliant on assistive device for balance Standing balance-Leahy Scale: Poor Standing balance comment: with RW support. pt able to stand at the sink with elbows on counter for hand hygiene                            Cognition Arousal/Alertness: Awake/alert Behavior During Therapy: [facility] for tasks assessed/performed Overall Cognitive Status: Within Functional Limits for tasks assessed  Exercises      General Comments        Pertinent Vitals/Pain Pain Assessment Pain Assessment: 0-10 Pain Score: 5  Pain Location: L ankle Pain Descriptors / Indicators:  Discomfort Pain Intervention(s): Monitored during session, Repositioned     PT Goals (current goals can now be found in the care plan section) Acute Rehab PT Goals Patient Stated Goal: go to rehab to get stronger & reduce fall risk before going home with wife. PT Goal Formulation: With patient Time For Goal Achievement: 11/18/22 Potential to Achieve Goals: Good Progress towards PT goals: Progressing toward goals    Frequency    Min 3X/week      PT Plan Current plan remains appropriate       AM-PAC PT "6 Clicks" Mobility   Outcome Measure  Help needed turning from your back to your side while in a flat bed without using bedrails?: None Help needed moving from lying on your back to sitting on the side of a flat bed without using bedrails?: None Help needed moving to and from a bed to a chair (including a wheelchair)?: A Little Help needed standing up from a chair using your arms (e.g., wheelchair or bedside chair)?: A Little Help needed to walk in hospital room?: A Little Help needed climbing 3-5 steps with a railing? : Total 6 Click Score: 18    End of Session Equipment Utilized During Treatment: Gait belt Activity Tolerance: Patient tolerated treatment well Patient left: in chair;with call bell/phone within reach;with family/visitor present Nurse Communication: Mobility status PT Visit Diagnosis: Muscle weakness (generalized) (M62.81);Unsteadiness on feet (R26.81);Other abnormalities of gait and mobility (R26.89);Difficulty in walking, not elsewhere classified (R26.2)     Time: 1610-9604 PT Time Calculation (min) (ACUTE ONLY): 24 min  Charges:  $Gait Training: 8-22 mins $Therapeutic Activity: 8-22 mins                     [name], PTA Acute Rehabilitation Services Secure Chat Preferred  [phone]    [name] 11/07/2022, 10:05 AM

## 2022-11-07 NOTE — Progress Notes (Signed)
   Subjective: 4 Days Post-Op Procedure(s) (LRB): Arthrodesis Left Ankle (Left) Medial and Lateral Malleolus Osteotomies (Left) Patient seen during rounds for Dr. Victorino Dike. Awaiting transfer to SNF, was not able to arrange transport quickly enough on Saturday to [facility]. Reports the ankle is doing well, he has some mild swelling but pain is controlled. No issues with splint.  Objective: Vital signs in last 24 hours: Temp:  [98.2 F (36.8 C)-98.4 F (36.9 C)] 98.4 F (36.9 C) (05/27 0843) Pulse Rate:  [57-77] 66 (05/27 0843) Resp:  [17-18] 18 (05/27 0843) BP: (128-155)/(78-86) 142/83 (05/27 0843) SpO2:  [96 %-98 %] 97 % (05/27 0843)  Intake/Output from previous day: No intake or output data in the 24 hours ending 11/07/22 0857  Intake/Output this shift: No intake/output data recorded.  Labs: No results for input(s): "HGB" in the last 72 hours. No results for input(s): "WBC", "RBC", "HCT", "PLT" in the last 72 hours. No results for input(s): "NA", "K", "CL", "CO2", "BUN", "CREATININE", "GLUCOSE", "CALCIUM" in the last 72 hours. No results for input(s): "LABPT", "INR" in the last 72 hours.  Exam: General - Patient is Alert and Oriented Extremity - Neurologically intact Neurovascular intact Sensation intact distally Dressing/Incision - LLE splinted Motor Function - intact, moving toes well on exam.  Past Medical History:  Diagnosis Date   Asthma    Atrial fibrillation (HCC)    Documented March 2021; pt states this was during Covid +, side effect   Essential hypertension    GERD (gastroesophageal reflux disease)    History of kidney stones    History of nephrolithiasis    Hyperlipemia    Obesity    BMI 42.6   Pneumonia    8 years ago   Psoriasis    Psoriatic arthritis (HCC)    Seasonal allergies    Sleep apnea     Assessment/Plan: 4 Days Post-Op Procedure(s) (LRB): Arthrodesis Left Ankle (Left) Medial and Lateral Malleolus Osteotomies (Left) Principal  Problem:   Status post ankle arthrodesis  Estimated body mass index is 44.93 kg/m as calculated from the following:   Height as of this encounter: 5\' 9"  (1.753 m).   Weight as of this encounter: 138 kg.  NWB to LLE Discharge to SNF tomorrow  Arther Abbott, PA-C Orthopedic Surgery [phone] 11/07/2022, 8:57 AM

## 2022-11-07 NOTE — [place] FL2 (Signed)
[place] MEDICAID FL2 LEVEL OF CARE FORM     IDENTIFICATION  Patient Name: [name] [name] Birthdate: 11/01/1960 Sex: male Admission Date (Current Location): 11/03/2022  County and Medicaid Number:  [place]   Facility and Address:  [facility], [address], [place], [place] [address]      Provider Number: [id number]  Attending Physician Name and Address:  Hewitt, John, MD  Relative Name and Phone Number:  Kathy Matusek [phone]    Current Level of Care: Hospital Recommended Level of Care: Skilled Nursing Facility Prior Approval Number:    Date Approved/Denied:   PASRR Number:    Discharge Plan:      Current Diagnoses: Patient Active Problem List   Diagnosis Date Noted   Status post ankle arthrodesis 11/03/2022   Closed fracture of left ankle 09/16/2022   At low risk for open-angle glaucoma in both eyes 09/06/2022   Spondylolisthesis of lumbar region 07/05/2022   Cellulitis 03/15/2022   Impaired glucose tolerance 08/08/2021   GERD (gastroesophageal reflux disease) 08/07/2021   Hypokalemia 08/07/2021   Psoriatic arthritis (HCC) 08/07/2021   Insomnia 08/07/2021   PAF (paroxysmal atrial fibrillation) (HCC) 08/07/2021   Obesity, Class III, BMI 40-49.9 (morbid obesity) (HCC) 08/07/2021   Hypomagnesemia 08/07/2021   Post-traumatic wound infection    Cellulitis of right lower leg 08/06/2021   Asthma, chronic 08/06/2021   Coagulopathy (HCC) 08/18/2019   Atrial fibrillation with RVR (HCC) 08/17/2019   COVID-19    Hypoxia    Immune deficiency disorder (HCC)    Empyema (HCC) 03/20/2014   Empyema of right pleural space (HCC) 03/18/2014   CAP (community acquired pneumonia) 03/14/2014   Hydronephrosis, left 03/14/2014   Back pain 03/14/2014   Hypertension    OSA on CPAP    HLD (hyperlipidemia)     Orientation RESPIRATION BLADDER Height & Weight     Place, Time, Self, Situation  Normal Continent Weight: (!) 304 lb 3.8 oz (138 kg) Height:   5' 9" (175.3 cm)  BEHAVIORAL SYMPTOMS/MOOD NEUROLOGICAL BOWEL NUTRITION STATUS      Continent Diet (see dc summary)  AMBULATORY STATUS COMMUNICATION OF NEEDS Skin   Extensive Assist Verbally Surgical wounds (Closed incision, leg, 11/05/22)                       Personal Care Assistance Level of Assistance  Bathing, Feeding, Dressing Bathing Assistance: Limited assistance Feeding assistance: Independent Dressing Assistance: Limited assistance     Functional Limitations Info  Sight, Hearing, Speech Sight Info: Adequate Hearing Info: Adequate Speech Info: Adequate    SPECIAL CARE FACTORS FREQUENCY  OT (By licensed OT), PT (By licensed PT)     PT Frequency: 5x week OT Frequency: 5x week            Contractures Contractures Info: Not present    Additional Factors Info  Code Status, Allergies, Psychotropic Code Status Info: Full Allergies Info: NKA Psychotropic Info: 2X DAILY         Current Medications (11/07/2022):  This is the current hospital active medication list Current Facility-Administered Medications  Medication Dose Route Frequency Provider Last Rate Last Admin   0.9 %  sodium chloride infusion   Intravenous Continuous Ollis, Justin Pike, PA-C       acetaminophen (TYLENOL) tablet 325-650 mg  325-650 mg Oral Q6H PRN Ollis, Justin Pike, PA-C       atorvastatin (LIPITOR) tablet 40 mg  40 mg Oral QHS Ollis, Justin Pike, PA-C     40 mg at 11/06/22 2052   cyclobenzaprine (FLEXERIL) tablet 10 mg  10 mg Oral TID PRN Ollis, Justin Pike, PA-C       diphenhydrAMINE (BENADRYL) 12.5 MG/5ML elixir 12.5-25 mg  12.5-25 mg Oral Q4H PRN Ollis, Justin Pike, PA-C       docusate sodium (COLACE) capsule 100 mg  100 mg Oral BID Ollis, Justin Pike, PA-C   100 mg at 11/07/22 0856   enoxaparin (LOVENOX) injection 40 mg  40 mg Subcutaneous Q24H Ollis, Justin Pike, PA-C   40 mg at 11/07/22 0856   gabapentin (NEURONTIN) capsule 600 mg  600 mg Oral BID Ollis, Justin Pike, PA-C   600 mg at  11/07/22 0856   HYDROmorphone (DILAUDID) injection 0.5-1 mg  0.5-1 mg Intravenous Q4H PRN Ollis, Justin Pike, PA-C       HYDROmorphone (DILAUDID) tablet 2-3 mg  2-3 mg Oral Q4H PRN Ollis, Justin Pike, PA-C   2 mg at 11/07/22 0216   lisinopril (ZESTRIL) tablet 20 mg  20 mg Oral Daily Ollis, Justin Pike, PA-C   20 mg at 11/07/22 0856   mometasone-formoterol (DULERA) 200-5 MCG/ACT inhaler 2 puff  2 puff Inhalation BID Ollis, Justin Pike, PA-C   2 puff at 11/07/22 0857   montelukast (SINGULAIR) tablet 10 mg  10 mg Oral QHS Ollis, Justin Pike, PA-C   10 mg at 11/06/22 2052   ondansetron (ZOFRAN) tablet 4 mg  4 mg Oral Q6H PRN Ollis, Justin Pike, PA-C       Or   ondansetron (ZOFRAN) injection 4 mg  4 mg Intravenous Q6H PRN Ollis, Justin Pike, PA-C       oxyCODONE (Oxy IR/ROXICODONE) immediate release tablet 5-10 mg  5-10 mg Oral Q4H PRN Ollis, Justin Pike, PA-C   10 mg at 11/07/22 0856   pantoprazole (PROTONIX) EC tablet 40 mg  40 mg Oral Daily Ollis, Justin Pike, PA-C   40 mg at 11/07/22 0856   senna (SENOKOT) tablet 8.6 mg  1 tablet Oral BID Ollis, Justin Pike, PA-C   8.6 mg at 11/07/22 0856   zolpidem (AMBIEN) tablet 10 mg  10 mg Oral QHS Ollis, Justin Pike, PA-C   10 mg at 11/06/22 2052     Discharge Medications: Please see discharge summary for a list of discharge medications.  Relevant Imaging Results:  Relevant Lab Results:   Additional Information SSN [id number]  Patsy Varma B Verta Riedlinger, LCSWA     

## 2022-11-08 DIAGNOSIS — S82842P Displaced bimalleolar fracture of left lower leg, subsequent encounter for closed fracture with malunion: Secondary | ICD-10-CM | POA: Diagnosis not present

## 2022-11-08 NOTE — Progress Notes (Signed)
Occupational Therapy Treatment Patient Details Name: [name] [name] MRN: [id number] DOB: 1960/08/20 Today's Date: 11/08/2022   History of present illness Pt is a 62 y/o M admitted on 11/03/22 for scheduled L ankle arthrodesis & L ankle medial & lateral malleolus osteotomies. PMH: asthma, a-fib, essential HTN, GERD, HLD, obesity, PNA, psoriatic arthritis, sleep apnea   OT comments  Pt in good spirits, motivated to participate, improve strength and functional independence. Pt's wife present during session. Pt states his L shoulder has been hurting today since he did a lot of exercise while in chair yesterday, performing chair dips with his entire body weight repeatedly. Pt has pain at L pec using RW during ambulation. Pt only able to ambulate ~20 feet but had to stop due to L shoulder pain. Pt instructed on theraband exercises to promote general BUE strength without overexertion. Pt instructed on safety awareness, energy conservation and allowing L shoulder to rest, using w/c to get to toilet when needed until L shoulder pain decreases, Pt and wife verbalized understanding. Pt able to perform ADLs at EOB, good overall ROM and strength to perform ADLs, LB ADLs limited due to NWB status. Pt would continue to benefit from skilled OT, DC plan still appropriate.   Recommendations for follow up therapy are one component of a multi-disciplinary discharge planning process, led by the attending physician.  Recommendations may be updated based on patient status, additional functional criteria and insurance authorization.    Assistance Recommended at Discharge Frequent or constant Supervision/Assistance  Patient can return home with the following  A little help with walking and/or transfers;A lot of help with bathing/dressing/bathroom;Assistance with cooking/housework;Help with stairs or ramp for entrance;Assist for transportation   Equipment Recommendations  Other (comment) (defer)    Recommendations for Other  Services      Precautions / Restrictions Precautions Precautions: Fall Restrictions Weight Bearing Restrictions: Yes LLE Weight Bearing: Non weight bearing       Mobility Bed Mobility Overal bed mobility: Modified Independent                  Transfers Overall transfer level: Needs assistance Equipment used: Rolling walker (2 wheels) Transfers: Sit to/from Stand, Bed to chair/wheelchair/BSC Sit to Stand: Min guard, From elevated surface     Step pivot transfers: Min guard     General transfer comment: elevated surface, RW for support, min guard for safety due to LLE NWB     Balance Overall balance assessment: Needs assistance Sitting-balance support: Feet supported Sitting balance-Leahy Scale: Good Sitting balance - Comments: sitting EOB   Standing balance support: During functional activity, Bilateral upper extremity supported, Reliant on assistive device for balance Standing balance-Leahy Scale: Poor Standing balance comment: using RW for support while standing.                           ADL either performed or assessed with clinical judgement   ADL Overall ADL's : Needs assistance/impaired Eating/Feeding: Independent   Grooming: Sitting;Supervision/safety;Set up           Upper Body Dressing : Set up;Sitting   Lower Body Dressing: Maximal assistance;Sitting/lateral leans   Toilet Transfer: Minimal assistance;Rolling walker (2 wheels)   Toileting- Clothing Manipulation and Hygiene: Min guard;Sitting/lateral lean       Functional mobility during ADLs: Min guard;Rolling walker (2 wheels) General ADL Comments: Pt able to perform ADLs at EOB, toileting at toilet, mobility limited due to new onset of L shoudler pain when using  RW, 8/10 pain, limits mobility. Pt states he will use w/c in room to get to toilet and back    Extremity/Trunk Assessment              Vision       Perception     Praxis      Cognition  Arousal/Alertness: Awake/alert Behavior During Therapy: WFL for tasks assessed/performed Overall Cognitive Status: Within Functional Limits for tasks assessed                                          Exercises Exercises: General Upper Extremity General Exercises - Upper Extremity Shoulder Flexion: Strengthening, Both, 20 reps, Theraband Theraband Level (Shoulder Flexion): Level 3 (Green) Shoulder Horizontal ABduction: Strengthening, Both, 20 reps, Theraband Theraband Level (Shoulder Horizontal Abduction): Level 3 (Green) Elbow Flexion: Strengthening, Both, 20 reps, Theraband Theraband Level (Elbow Flexion): Level 3 (Green) Elbow Extension: Strengthening, Both, 20 reps, Theraband Theraband Level (Elbow Extension): Level 3 (Green)    Shoulder Instructions       General Comments      Pertinent Vitals/ Pain       Pain Assessment Pain Assessment: 0-10 Pain Score: 8  Pain Location: L ankle, L shoulder Pain Descriptors / Indicators: Aching, Discomfort, Sharp Pain Intervention(s): Limited activity within patient's tolerance, Monitored during session  Home Living                                          Prior Functioning/Environment              Frequency  Min 2X/week        Progress Toward Goals  OT Goals(current goals can now be found in the care plan section)  Progress towards OT goals: Progressing toward goals  Acute Rehab OT Goals Patient Stated Goal: to improve functional strength, reduce pain OT Goal Formulation: With patient Time For Goal Achievement: 11/18/22 Potential to Achieve Goals: Good ADL Goals Pt Will Perform Grooming: standing;with supervision Pt Will Perform Lower Body Bathing: sitting/lateral leans;with supervision;with set-up Pt Will Transfer to Toilet: ambulating;with supervision  Plan Discharge plan remains appropriate    Co-evaluation                 AM-PAC OT "6 Clicks" Daily Activity      Outcome Measure   Help from another person eating meals?: None Help from another person taking care of personal grooming?: A Little Help from another person toileting, which includes using toliet, bedpan, or urinal?: A Lot Help from another person bathing (including washing, rinsing, drying)?: A Lot Help from another person to put on and taking off regular upper body clothing?: A Little Help from another person to put on and taking off regular lower body clothing?: A Lot 6 Click Score: 16    End of Session Equipment Utilized During Treatment: Gait belt;Rolling walker (2 wheels)  OT Visit Diagnosis: Unsteadiness on feet (R26.81);Pain;Other abnormalities of gait and mobility (R26.89) Pain - Right/Left: Left Pain - part of body: Leg;Shoulder   Activity Tolerance Patient limited by pain   Patient Left in bed;with call bell/phone within reach;with family/visitor present   Nurse Communication Mobility status        Time: 7829-5621 OT Time Calculation (min): 31 min  Charges: OT General Charges $OT Visit: 1 Visit  OT Treatments $Self Care/Home Management : 8-22 mins $Therapeutic Exercise: 8-22 mins  Shontay Wallner, OTR/L   Alexis Goodell 11/08/2022, 1:39 PM

## 2022-11-08 NOTE — Progress Notes (Signed)
Patient using home CPAP independently. This RT plugged machine in and had in reach for him to use.

## 2022-11-08 NOTE — Plan of Care (Signed)
  Problem: Education: Goal: Knowledge of General Education information will improve Description: Including pain rating scale, medication(s)/side effects and non-pharmacologic comfort measures Outcome: Progressing   Problem: Health Behavior/Discharge Planning: Goal: Ability to manage health-related needs will improve Outcome: Progressing   Problem: Clinical Measurements: Goal: Ability to maintain clinical measurements within normal limits will improve Outcome: Progressing Goal: Will remain free from infection Outcome: Completed/Met Goal: Diagnostic test results will improve Outcome: Progressing Goal: Respiratory complications will improve Outcome: Progressing Goal: Cardiovascular complication will be avoided Outcome: Progressing   Problem: Activity: Goal: Risk for activity intolerance will decrease Outcome: Progressing   Problem: Coping: Goal: Level of anxiety will decrease Outcome: Progressing   Problem: Elimination: Goal: Will not experience complications related to bowel motility Outcome: Progressing Goal: Will not experience complications related to urinary retention Outcome: Progressing   Problem: Safety: Goal: Ability to remain free from injury will improve Outcome: Progressing

## 2022-11-08 NOTE — TOC Progression Note (Signed)
Transition of Care [facility]) - Progression Note    Patient Details  Name: [name] [name] MRN: [id number] Date of Birth: October 09, 1960  [facility]) CM/SW Contact  Lorri Frederick, LCSW Phone Number: 11/08/2022, 10:49 AM  Clinical Narrative:   CSW spoke with [facility].  They can offer a bed and have already been in contact with pt family.  They will start insurance auth and will reach back out when approved.     Expected Discharge Plan: Skilled Nursing Facility Barriers to Discharge: SNF Pending discharge summary  Expected Discharge Plan and Services       Living arrangements for the past 2 months: Single Family Home Expected Discharge Date: 11/05/22                                     Social Determinants of Health (SDOH) Interventions SDOH Screenings   Food Insecurity: Patient Declined (03/16/2022)  Housing: Low Risk  (03/16/2022)  Transportation Needs: No Transportation Needs (03/16/2022)  Utilities: Not At Risk (03/16/2022)  Tobacco Use: Low Risk  (11/04/2022)    Readmission Risk Interventions    11/05/2022    2:55 PM  Readmission Risk Prevention Plan  Post Dischage Appt Complete  Medication Screening Complete  Transportation Screening Complete

## 2022-11-08 NOTE — Progress Notes (Signed)
Subjective: 5 Days Post-Op Procedure(s) (LRB): Arthrodesis Left Ankle (Left) Medial and Lateral Malleolus Osteotomies (Left)  Called and spoke to patient on the phone.  Notes that he is doing well.  He has worked well with therapy.  Awaiting SNF placement.   Objective:   VITALS:  Temp:  [98 F (36.7 C)-98.5 F (36.9 C)] 98.5 F (36.9 C) (05/28 1326) Pulse Rate:  [51-88] 88 (05/28 1326) Resp:  [15-17] 17 (05/28 1326) BP: (123-139)/(72-89) 139/89 (05/28 1326) SpO2:  [97 %-99 %] 99 % (05/28 1326)     LABS No results for input(s): "HGB", "WBC", "PLT" in the last 72 hours. No results for input(s): "NA", "K", "CL", "CO2", "BUN", "CREATININE", "GLUCOSE" in the last 72 hours. No results for input(s): "LABPT", "INR" in the last 72 hours.   Assessment/Plan: 5 Days Post-Op Procedure(s) (LRB): Arthrodesis Left Ankle (Left) Medial and Lateral Malleolus Osteotomies (Left)  NWB L LE Up with therapy D/C scripts on chart. Disp: SNF Please contact me when patient is ready for D/C and I'll place order.  [name] PA-C [facility] Office:  [phone]   Florentina Addison [facility] 11/08/2022, 4:57 PM

## 2022-11-08 NOTE — Progress Notes (Signed)
Mobility Specialist Progress Note   11/08/22 0945  Mobility  Activity Ambulated with assistance in hallway;Ambulated with assistance in room  Level of Assistance Contact guard assist, steadying assist  Assistive Device Front wheel walker (Knee Scooter)  Distance Ambulated (ft) 10 ft (534ft w/knee scooter)  Range of Motion/Exercises Active;All extremities  LLE Weight Bearing NWB  Activity Response Tolerated well   Patient received in supine and agreeable to participate. Prior to ambulation reviewed hop-to gait with receptive teach back. Completed bed mobility independently and stood with minimal HHA from elevated bed height. Ambulated short distance in room with hop-to gait. Required multiple standing rest breaks secondary to UE fatigue. Patient then transferred to knee scooter with min A and ambulated long distance in hallway. Returned to room without complaint or incident. Was left in recliner with all needs met, call bell in reach.   [place] Aulton Routt, BS EXP Mobility Specialist Please contact via SecureChat or Rehab office at [phone]

## 2022-11-09 ENCOUNTER — Observation Stay (HOSPITAL_BASED_OUTPATIENT_CLINIC_OR_DEPARTMENT_OTHER): Payer: BC Managed Care – PPO | Admitting: Anesthesiology

## 2022-11-09 DIAGNOSIS — S82842P Displaced bimalleolar fracture of left lower leg, subsequent encounter for closed fracture with malunion: Secondary | ICD-10-CM | POA: Diagnosis not present

## 2022-11-09 NOTE — Progress Notes (Signed)
PT AVS reviewed with facility nurse Victorino Dike. IV removed without complications. Pt has all belongings and wife will transport to facility.

## 2022-11-09 NOTE — TOC Transition Note (Signed)
Transition of Care [facility]) - CM/SW Discharge Note   Patient Details  Name: [name] [name] MRN: [id number] Date of Birth: 1961-02-16  [facility]) CM/SW Contact:  [name] Frederick, LCSW Phone Number: 11/09/2022, 3:32 PM   Clinical Narrative:   Pt discharging to [facility].  RN call report to [phone].  Wife [name] [name] will transport pt.  Will need pt brought down to main north tower entrance and help getting him into the vehicle.    Final next level of care: Skilled Nursing Facility Barriers to Discharge: Barriers Resolved   Patient Goals and CMS Choice      Discharge Placement                Patient chooses bed at:  [facility] [place], [place]) Patient to be transferred to facility by: wife [name] [name] Name of family member notified: wife [name] [name] Patient and family notified of of transfer: 11/09/22  Discharge Plan and Services Additional resources added to the After Visit Summary for                                       Social Determinants of Health (SDOH) Interventions SDOH Screenings   Food Insecurity: Patient Declined (03/16/2022)  Housing: Low Risk  (03/16/2022)  Transportation Needs: No Transportation Needs (03/16/2022)  Utilities: Not At Risk (03/16/2022)  Tobacco Use: Low Risk  (11/04/2022)     Readmission Risk Interventions    11/05/2022    2:55 PM  Readmission Risk Prevention Plan  Post Dischage Appt Complete  Medication Screening Complete  Transportation Screening Complete

## 2022-11-09 NOTE — TOC Progression Note (Addendum)
Transition of Care [facility]) - Progression Note    Patient Details  Name: [name] [name] MRN: [id number] Date of Birth: 1961-02-05  Transition of Care [facility]) CM/SW Contact  Lorri Frederick, LCSW Phone Number: 11/09/2022, 1:18 PM  Clinical Narrative:    Message from [facility].  SNF auth remains pending.   Pt and wife updated in room.  Discussed transportation, wife is able to transport to SNF.  1500: Message from [employer].  Auth approved, they can take pt today, need DC summary by 1530.  PA informed and can do that.  RN informed. Pt informed.  Request info faxed to [facility].   Expected Discharge Plan: Skilled Nursing Facility Barriers to Discharge: SNF Pending discharge summary  Expected Discharge Plan and Services       Living arrangements for the past 2 months: Single Family Home Expected Discharge Date: 11/05/22                                     Social Determinants of Health (SDOH) Interventions SDOH Screenings   Food Insecurity: Patient Declined (03/16/2022)  Housing: Low Risk  (03/16/2022)  Transportation Needs: No Transportation Needs (03/16/2022)  Utilities: Not At Risk (03/16/2022)  Tobacco Use: Low Risk  (11/04/2022)    Readmission Risk Interventions    11/05/2022    2:55 PM  Readmission Risk Prevention Plan  Post Dischage Appt Complete  Medication Screening Complete  Transportation Screening Complete

## 2022-11-09 NOTE — Progress Notes (Signed)
Physical Therapy Treatment Patient Details Name: [name] [name] MRN: [id number] DOB: 1960-11-13 Today's Date: 11/09/2022   History of Present Illness Pt is a 62 y/o M admitted on 11/03/22 for scheduled L ankle arthrodesis & L ankle medial & lateral malleolus osteotomies. PMH: asthma, a-fib, essential HTN, GERD, HLD, obesity, PNA, psoriatic arthritis, sleep apnea    PT Comments    Pt seen for PT tx with pt agreeable, wife present for session. Pt is able to complete STS from various surfaces of varying heights with supervision & education re: hand placement. Pt ambulates room<>gym with knee scooter with mod I. Spent significant time discussing stair negotiation as pt has 2 steps without rails to enter home. Demonstrated & educated pt on stair negotiation backwards with RW but pt declining attempt, pt noting he doesn't feel strong enough at this time. Attempted stair negotiation with use of shower chair & STS method but pt unable to stand from lower surface. PT educated pt on possible use of BSC with off set legs to allow UE support to push to standing or possibility of installing ramp. Pt & wife appreciative of education but hopeful he will get stronger in rehab to be able to hop up steps to enter home.    Recommendations for follow up therapy are one component of a multi-disciplinary discharge planning process, led by the attending physician.  Recommendations may be updated based on patient status, additional functional criteria and insurance authorization.  Follow Up Recommendations  Can patient physically be transported by private vehicle: Yes    Assistance Recommended at Discharge Intermittent Supervision/Assistance  Patient can return home with the following A little help with walking and/or transfers;Assistance with cooking/housework;A little help with bathing/dressing/bathroom;Assist for transportation;Help with stairs or ramp for entrance   Equipment Recommendations  None recommended by PT (TBD  in next venue)    Recommendations for Other Services       Precautions / Restrictions Precautions Precautions: Fall Restrictions Weight Bearing Restrictions: Yes LLE Weight Bearing: Non weight bearing     Mobility  Bed Mobility                    Transfers   Equipment used: Rolling walker (2 wheels) (knee scooter) Transfers: Sit to/from Stand Sit to Stand: Supervision           General transfer comment: STS from various surfaces of varying height with RW & knee scooter with close supervision, education re: hand placement.    Ambulation/Gait Ambulation/Gait assistance: Modified independent (Device/Increase time) Gait Distance (Feet): 100 Feet (+ 100 ft) Assistive device: None (knee scooter) Gait Pattern/deviations: Step-to pattern           Stairs             Wheelchair Mobility    Modified Rankin (Stroke Patients Only)       Balance Overall balance assessment: Needs assistance Sitting-balance support: Feet supported Sitting balance-Leahy Scale: Good     Standing balance support: During functional activity, Single extremity supported Standing balance-Leahy Scale: Good                              Cognition Arousal/Alertness: Awake/alert Behavior During Therapy: WFL for tasks assessed/performed Overall Cognitive Status: Within Functional Limits for tasks assessed  Exercises      General Comments        Pertinent Vitals/Pain Pain Assessment Pain Assessment: No/denies pain    Home Living                          Prior Function            PT Goals (current goals can now be found in the care plan section) Acute Rehab PT Goals Patient Stated Goal: go to rehab to get stronger & reduce fall risk before going home with wife. PT Goal Formulation: With patient Time For Goal Achievement: 11/18/22 Potential to Achieve Goals: Good Progress towards PT  goals: Progressing toward goals    Frequency    Min 3X/week      PT Plan Current plan remains appropriate    Co-evaluation              AM-PAC PT "6 Clicks" Mobility   Outcome Measure  Help needed turning from your back to your side while in a flat bed without using bedrails?: None Help needed moving from lying on your back to sitting on the side of a flat bed without using bedrails?: None Help needed moving to and from a bed to a chair (including a wheelchair)?: A Little Help needed standing up from a chair using your arms (e.g., wheelchair or bedside chair)?: A Little Help needed to walk in hospital room?: A Little Help needed climbing 3-5 steps with a railing? : Total 6 Click Score: 18    End of Session Equipment Utilized During Treatment: Gait belt Activity Tolerance: Patient tolerated treatment well Patient left: in bed;with call bell/phone within reach;with family/visitor present   PT Visit Diagnosis: Muscle weakness (generalized) (M62.81);Unsteadiness on feet (R26.81);Other abnormalities of gait and mobility (R26.89);Difficulty in walking, not elsewhere classified (R26.2)     Time: 1610-9604 PT Time Calculation (min) (ACUTE ONLY): 41 min  Charges:  $Gait Training: 8-22 mins $Therapeutic Activity: 23-37 mins                     Aleda Grana, PT, DPT 11/09/22, 9:50 AM    Sandi Mariscal 11/09/2022, 9:48 AM

## 2023-02-09 ENCOUNTER — Ambulatory Visit: Payer: BC Managed Care – PPO | Admitting: Cardiology

## 2023-03-28 IMAGING — CT CT ABD-PELV W/ CM
2 of 5 series · 16 of 46 positions shown, 18 images · IV contrast (omnipaque)
Comparison: 03/13/2014

CLINICAL DATA: Left lower quadrant abdominal pain. Nausea.
Dizziness.

EXAM:
CT ABDOMEN AND PELVIS WITH CONTRAST
TECHNIQUE: Multidetector CT imaging of the abdomen and pelvis was performed
using the standard protocol following bolus administration of
intravenous contrast.
CONTRAST:  75mL OMNIPAQUE IOHEXOL 300 MG/ML  SOLN

[Series 2: axial st · axial · 0.90mm/px · z∈[-965,-455]mm · 13 of 114 slices shown, 15 images]
[im 6/114  soft-tissue]
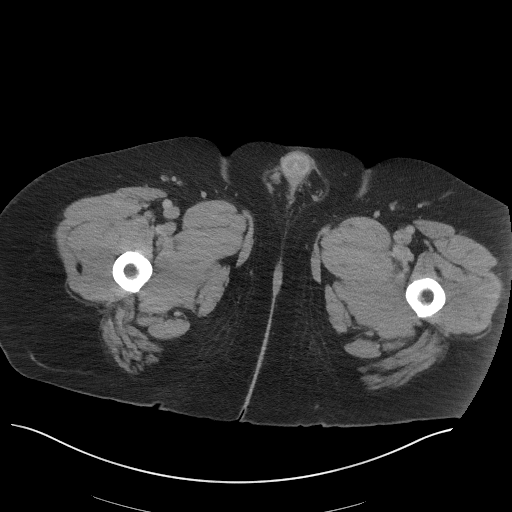
[im 6/114  bone]
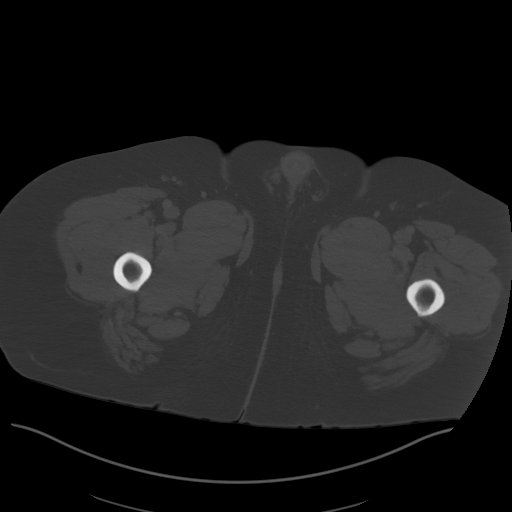
[im 17/114  soft-tissue]
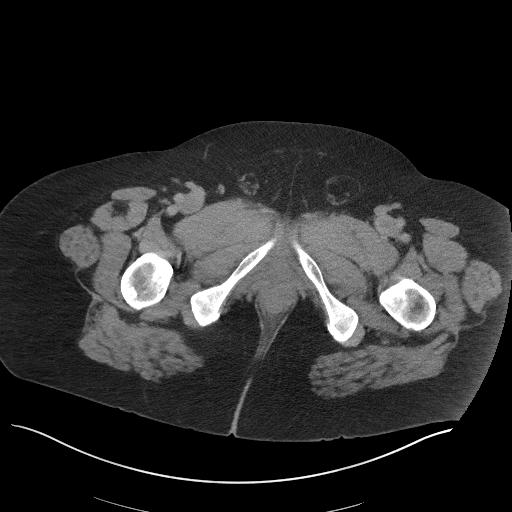
[im 23/114  soft-tissue]
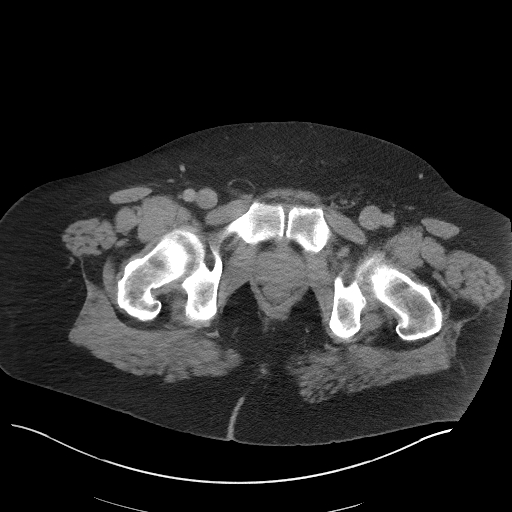
[im 34/114  soft-tissue]
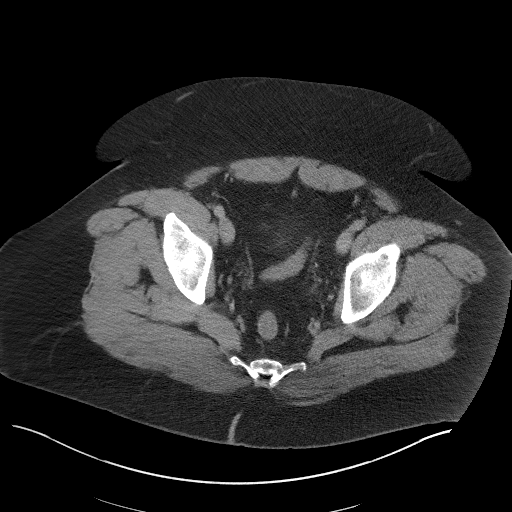
[im 40/114  soft-tissue]
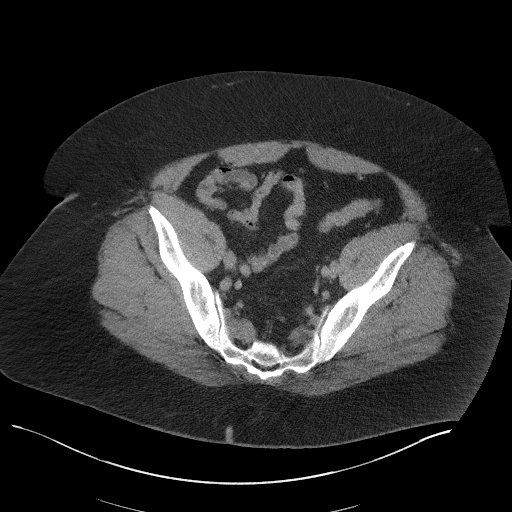
[im 51/114  soft-tissue]
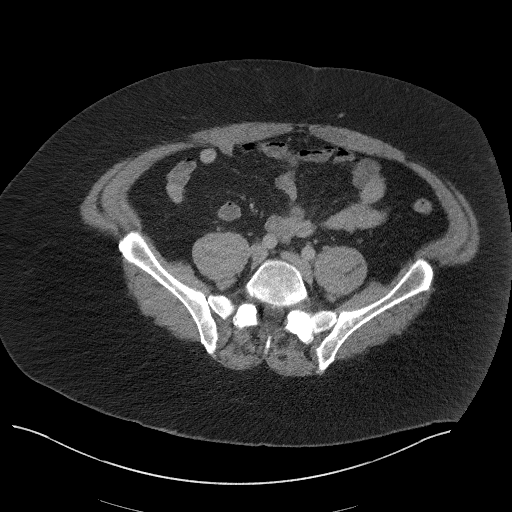
[im 57/114  soft-tissue]
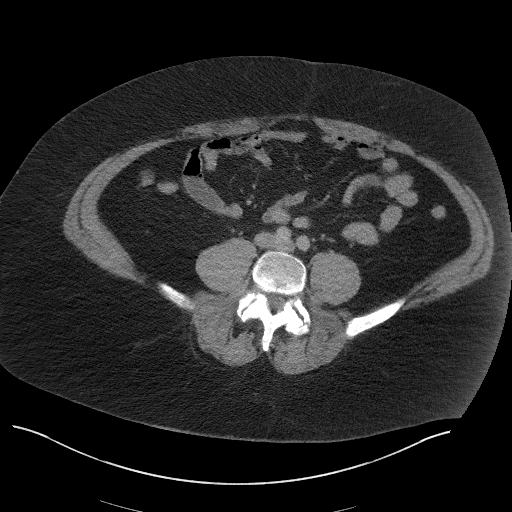
[im 63/114  soft-tissue]
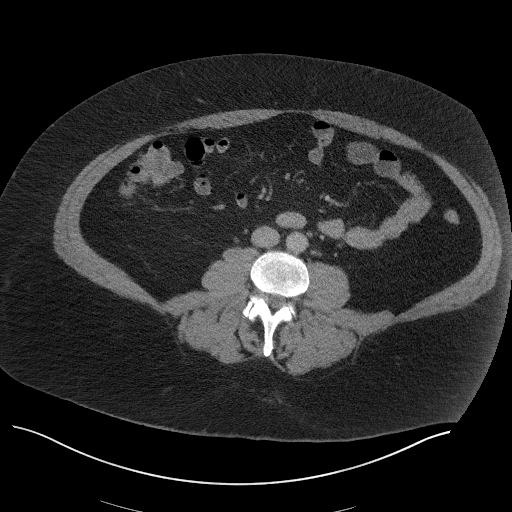
[im 74/114  soft-tissue]
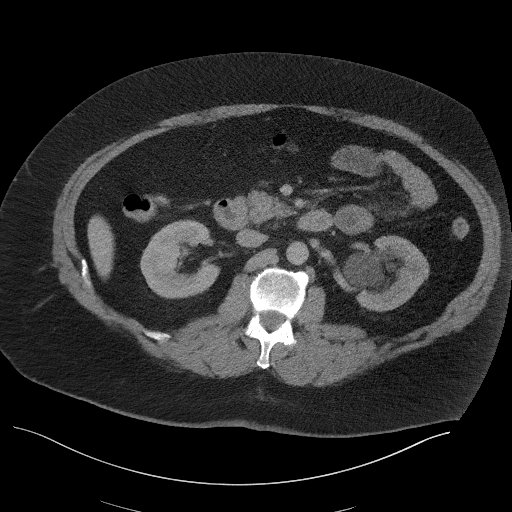
[im 74/114  bone]
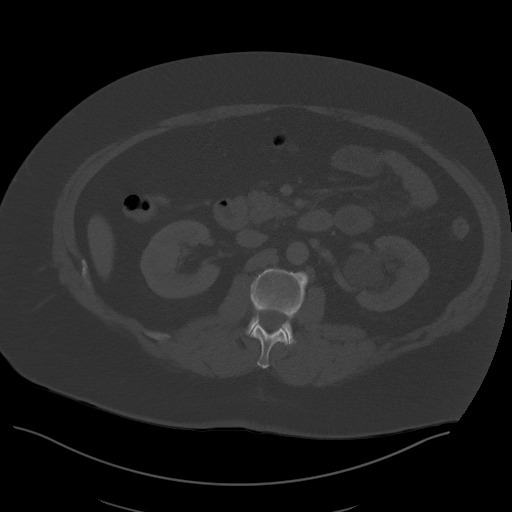
[im 80/114  soft-tissue]
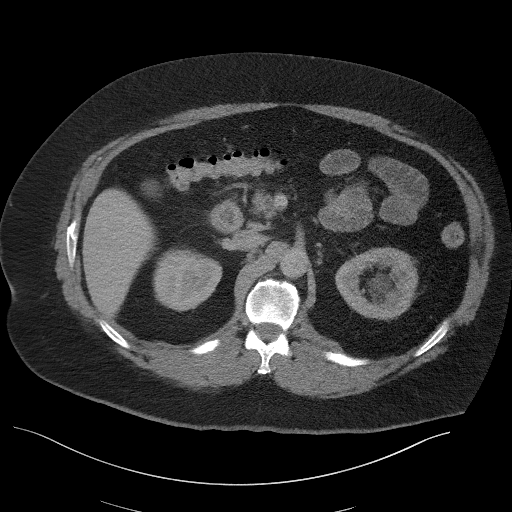
[im 91/114  soft-tissue]
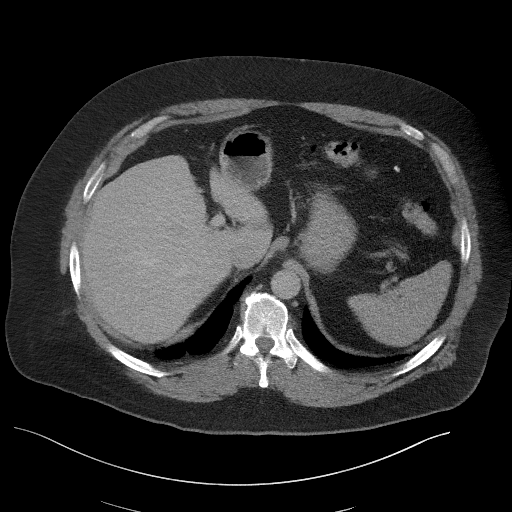
[im 97/114  soft-tissue]
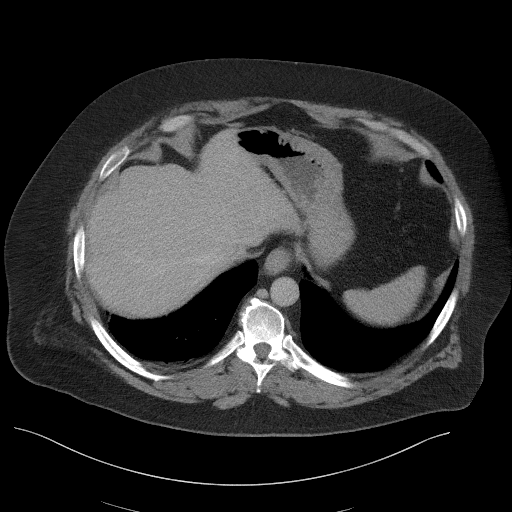
[im 108/114  soft-tissue]
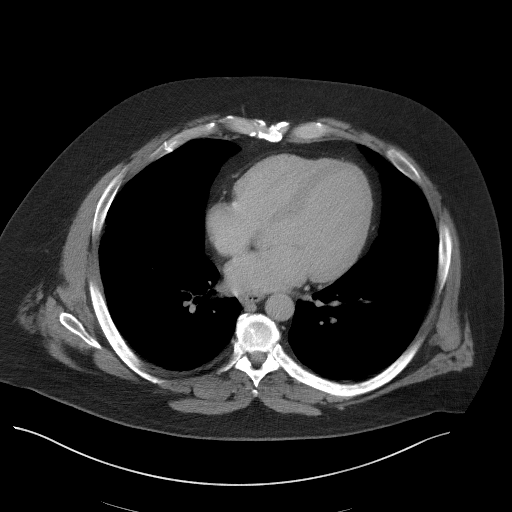

[Series 5: coronal st · coronal · 1.08mm/px · 3 of 124 slices shown]
[im 42/124  soft-tissue]
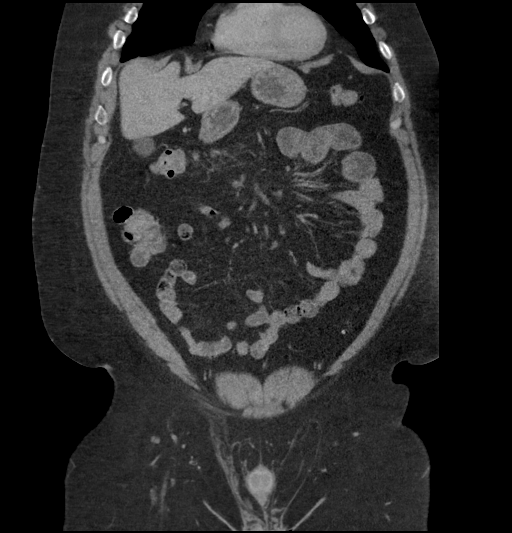
[im 55/124  soft-tissue]
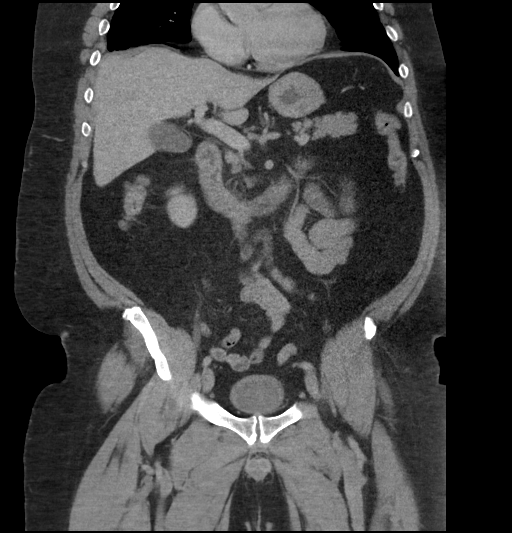
[im 69/124  soft-tissue]
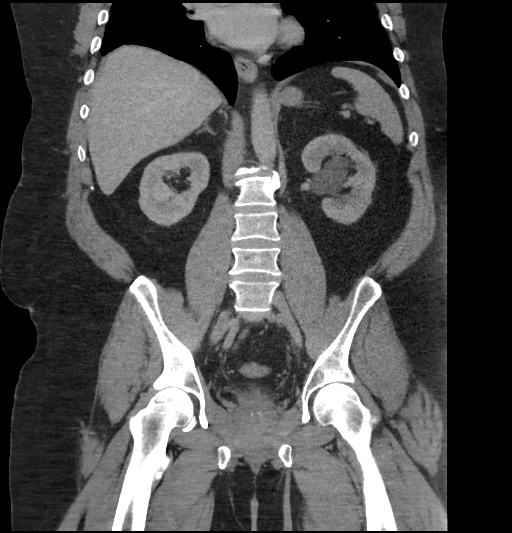

[16 of 46 positions shown; findings below may reference images not displayed]

FINDINGS: Lower chest: Mild scarring at the right lung base. Right coronary
artery atherosclerotic calcification.

Hepatobiliary: Unremarkable

Pancreas: Unremarkable

Spleen: Unremarkable

Adrenals/Urinary Tract: Adrenal glands appear normal.

Left peripelvic cysts noted mildly effacing the left collecting
system. There is a suggestion of subtle hypoenhancement the left
kidney compared to the right.

Small hypodense lesions in the right kidney upper pole on image 12
of series 7 are probably small cysts but technically too small to
characterize.

Two tiny nonobstructive right kidney lower pole calculi are present.
0.6 cm exophytic lesion of the right kidney lower pole on image 62
series 5 technically too small to characterize.

Stomach/Bowel: Unremarkable

Vascular/Lymphatic: Mild left common iliac artery atherosclerotic
calcification.

Reproductive: Unremarkable

Other: No supplemental non-categorized findings.

Musculoskeletal: Bone island in the right proximal femur. Lower
lumbar spondylosis and degenerative disc disease resulting in
foraminal impingement bilaterally at L4-5 and on the right at L3-4.
Suspected facet joint effusions at the L4-5 level bilaterally.

Fatty left spermatic cord.
IMPRESSION: 1. Mild hypoenhancement of the left kidney compared to the right. A
specific cause is not identified, left renal artery appears widely
patent. Correlate with urine analysis to assess for urinary tract
infection or pyelonephritis. There are notable left peripelvic cysts
effacing the left collecting system.
2. Other imaging findings of potential clinical significance: Right
coronary artery atherosclerotic calcification. Lumbar impingement at
L4-5 and L3-4. Nonobstructive right nephrolithiasis.

## 2023-05-05 ENCOUNTER — Ambulatory Visit: Payer: BC Managed Care – PPO | Attending: Cardiology | Admitting: Cardiology

## 2023-05-05 ENCOUNTER — Encounter: Payer: Self-pay | Admitting: Cardiology
